# Patient Record
Sex: Male | Born: 1940 | Race: White | Hispanic: No | Smoking: Never smoker
Health system: Southern US, Community
[De-identification: ages and names within clinical notes are randomized; demographics above are authoritative.]

## PROBLEM LIST (undated history)

## (undated) DIAGNOSIS — I2699 Other pulmonary embolism without acute cor pulmonale: Secondary | ICD-10-CM

## (undated) DIAGNOSIS — R351 Nocturia: Secondary | ICD-10-CM

## (undated) DIAGNOSIS — I2510 Atherosclerotic heart disease of native coronary artery without angina pectoris: Secondary | ICD-10-CM

## (undated) DIAGNOSIS — Z9289 Personal history of other medical treatment: Secondary | ICD-10-CM

## (undated) DIAGNOSIS — N200 Calculus of kidney: Secondary | ICD-10-CM

## (undated) DIAGNOSIS — J189 Pneumonia, unspecified organism: Secondary | ICD-10-CM

## (undated) DIAGNOSIS — F32A Depression, unspecified: Secondary | ICD-10-CM

## (undated) DIAGNOSIS — Z8719 Personal history of other diseases of the digestive system: Secondary | ICD-10-CM

## (undated) DIAGNOSIS — I10 Essential (primary) hypertension: Secondary | ICD-10-CM

## (undated) DIAGNOSIS — F329 Major depressive disorder, single episode, unspecified: Secondary | ICD-10-CM

## (undated) DIAGNOSIS — Z87442 Personal history of urinary calculi: Secondary | ICD-10-CM

## (undated) DIAGNOSIS — I639 Cerebral infarction, unspecified: Secondary | ICD-10-CM

## (undated) DIAGNOSIS — E785 Hyperlipidemia, unspecified: Secondary | ICD-10-CM

## (undated) DIAGNOSIS — E119 Type 2 diabetes mellitus without complications: Secondary | ICD-10-CM

## (undated) DIAGNOSIS — R569 Unspecified convulsions: Secondary | ICD-10-CM

## (undated) DIAGNOSIS — E669 Obesity, unspecified: Secondary | ICD-10-CM

## (undated) DIAGNOSIS — R600 Localized edema: Secondary | ICD-10-CM

## (undated) DIAGNOSIS — Z973 Presence of spectacles and contact lenses: Secondary | ICD-10-CM

## (undated) DIAGNOSIS — I219 Acute myocardial infarction, unspecified: Secondary | ICD-10-CM

## (undated) DIAGNOSIS — M1990 Unspecified osteoarthritis, unspecified site: Secondary | ICD-10-CM

## (undated) HISTORY — PX: HERNIA REPAIR: SHX51

## (undated) HISTORY — DX: Type 2 diabetes mellitus without complications: E11.9

## (undated) HISTORY — DX: Calculus of kidney: N20.0

## (undated) HISTORY — DX: Cerebral infarction, unspecified: I63.9

## (undated) HISTORY — PX: ANGIOPLASTY / STENTING ILIAC: SUR31

## (undated) HISTORY — DX: Other pulmonary embolism without acute cor pulmonale: I26.99

## (undated) HISTORY — DX: Obesity, unspecified: E66.9

## (undated) HISTORY — PX: COLONOSCOPY: SHX174

## (undated) HISTORY — PX: JOINT REPLACEMENT: SHX530

## (undated) HISTORY — DX: Major depressive disorder, single episode, unspecified: F32.9

## (undated) HISTORY — DX: Localized edema: R60.0

## (undated) HISTORY — DX: Unspecified osteoarthritis, unspecified site: M19.90

## (undated) HISTORY — DX: Atherosclerotic heart disease of native coronary artery without angina pectoris: I25.10

## (undated) HISTORY — DX: Depression, unspecified: F32.A

## (undated) HISTORY — DX: Personal history of other medical treatment: Z92.89

## (undated) HISTORY — DX: Hyperlipidemia, unspecified: E78.5

## (undated) HISTORY — PX: UMBILICAL HERNIA REPAIR: SHX196

## (undated) HISTORY — PX: TOTAL KNEE ARTHROPLASTY: SHX125

## (undated) HISTORY — PX: BACK SURGERY: SHX140

## (undated) HISTORY — DX: Essential (primary) hypertension: I10

---

## 1994-06-30 HISTORY — PX: CORONARY ARTERY BYPASS GRAFT: SHX141

## 1998-06-20 ENCOUNTER — Observation Stay (HOSPITAL_COMMUNITY): Admission: RE | Admit: 1998-06-20 | Discharge: 1998-06-21 | Payer: Self-pay | Admitting: Surgery

## 1998-06-23 ENCOUNTER — Ambulatory Visit (HOSPITAL_COMMUNITY): Admission: RE | Admit: 1998-06-23 | Discharge: 1998-06-23 | Payer: Self-pay | Admitting: Surgery

## 1998-09-18 ENCOUNTER — Emergency Department (HOSPITAL_COMMUNITY): Admission: EM | Admit: 1998-09-18 | Discharge: 1998-09-19 | Payer: Self-pay | Admitting: Emergency Medicine

## 1999-04-16 ENCOUNTER — Ambulatory Visit: Admission: RE | Admit: 1999-04-16 | Discharge: 1999-04-16 | Payer: Self-pay | Admitting: Family Medicine

## 1999-05-04 ENCOUNTER — Inpatient Hospital Stay (HOSPITAL_COMMUNITY): Admission: EM | Admit: 1999-05-04 | Discharge: 1999-05-10 | Payer: Self-pay | Admitting: Emergency Medicine

## 1999-05-04 ENCOUNTER — Encounter: Payer: Self-pay | Admitting: Emergency Medicine

## 1999-05-04 ENCOUNTER — Encounter: Payer: Self-pay | Admitting: *Deleted

## 1999-05-06 ENCOUNTER — Encounter: Payer: Self-pay | Admitting: *Deleted

## 1999-05-08 ENCOUNTER — Encounter: Payer: Self-pay | Admitting: *Deleted

## 1999-05-10 ENCOUNTER — Encounter: Payer: Self-pay | Admitting: *Deleted

## 1999-05-10 ENCOUNTER — Inpatient Hospital Stay (HOSPITAL_COMMUNITY)
Admission: RE | Admit: 1999-05-10 | Discharge: 1999-05-22 | Payer: Self-pay | Admitting: Physical Medicine & Rehabilitation

## 1999-05-11 ENCOUNTER — Encounter: Payer: Self-pay | Admitting: Physical Medicine & Rehabilitation

## 1999-05-13 ENCOUNTER — Encounter: Payer: Self-pay | Admitting: Physical Medicine & Rehabilitation

## 1999-05-29 ENCOUNTER — Encounter
Admission: RE | Admit: 1999-05-29 | Discharge: 1999-08-19 | Payer: Self-pay | Admitting: Physical Medicine & Rehabilitation

## 1999-06-25 ENCOUNTER — Encounter
Admission: RE | Admit: 1999-06-25 | Discharge: 1999-09-23 | Payer: Self-pay | Admitting: Physical Medicine & Rehabilitation

## 1999-09-05 ENCOUNTER — Encounter: Payer: Self-pay | Admitting: Specialist

## 1999-09-05 ENCOUNTER — Ambulatory Visit (HOSPITAL_COMMUNITY): Admission: RE | Admit: 1999-09-05 | Discharge: 1999-09-05 | Payer: Self-pay | Admitting: Specialist

## 1999-10-10 ENCOUNTER — Encounter: Payer: Self-pay | Admitting: Specialist

## 1999-10-10 ENCOUNTER — Ambulatory Visit (HOSPITAL_COMMUNITY): Admission: RE | Admit: 1999-10-10 | Discharge: 1999-10-10 | Payer: Self-pay | Admitting: Orthopedic Surgery

## 1999-10-22 ENCOUNTER — Encounter: Payer: Self-pay | Admitting: Specialist

## 1999-10-22 ENCOUNTER — Encounter: Admission: RE | Admit: 1999-10-22 | Discharge: 1999-10-22 | Payer: Self-pay | Admitting: Specialist

## 2000-04-22 ENCOUNTER — Inpatient Hospital Stay (HOSPITAL_COMMUNITY): Admission: EM | Admit: 2000-04-22 | Discharge: 2000-04-24 | Payer: Self-pay | Admitting: Emergency Medicine

## 2000-04-23 ENCOUNTER — Encounter: Payer: Self-pay | Admitting: Cardiovascular Disease

## 2001-02-17 ENCOUNTER — Encounter: Admission: RE | Admit: 2001-02-17 | Discharge: 2001-02-27 | Payer: Self-pay | Admitting: Anesthesiology

## 2001-12-18 ENCOUNTER — Encounter: Payer: Self-pay | Admitting: Emergency Medicine

## 2001-12-18 ENCOUNTER — Inpatient Hospital Stay (HOSPITAL_COMMUNITY): Admission: EM | Admit: 2001-12-18 | Discharge: 2001-12-20 | Payer: Self-pay | Admitting: Emergency Medicine

## 2002-01-13 ENCOUNTER — Encounter: Admission: RE | Admit: 2002-01-13 | Discharge: 2002-04-13 | Payer: Self-pay | Admitting: Family Medicine

## 2002-08-19 ENCOUNTER — Encounter: Payer: Self-pay | Admitting: Emergency Medicine

## 2002-08-19 ENCOUNTER — Emergency Department (HOSPITAL_COMMUNITY): Admission: EM | Admit: 2002-08-19 | Discharge: 2002-08-19 | Payer: Self-pay | Admitting: Emergency Medicine

## 2003-01-31 ENCOUNTER — Ambulatory Visit (HOSPITAL_COMMUNITY): Admission: RE | Admit: 2003-01-31 | Discharge: 2003-01-31 | Payer: Self-pay | Admitting: Cardiology

## 2003-01-31 ENCOUNTER — Encounter: Payer: Self-pay | Admitting: Cardiology

## 2003-02-05 ENCOUNTER — Emergency Department (HOSPITAL_COMMUNITY): Admission: EM | Admit: 2003-02-05 | Discharge: 2003-02-05 | Payer: Self-pay | Admitting: *Deleted

## 2003-12-12 ENCOUNTER — Inpatient Hospital Stay (HOSPITAL_COMMUNITY): Admission: EM | Admit: 2003-12-12 | Discharge: 2003-12-14 | Payer: Self-pay | Admitting: Emergency Medicine

## 2003-12-12 ENCOUNTER — Encounter: Payer: Self-pay | Admitting: Cardiology

## 2004-04-12 ENCOUNTER — Encounter (INDEPENDENT_AMBULATORY_CARE_PROVIDER_SITE_OTHER): Payer: Self-pay | Admitting: Specialist

## 2004-04-12 ENCOUNTER — Ambulatory Visit (HOSPITAL_COMMUNITY): Admission: RE | Admit: 2004-04-12 | Discharge: 2004-04-12 | Payer: Self-pay | Admitting: Neurological Surgery

## 2004-04-25 ENCOUNTER — Observation Stay (HOSPITAL_COMMUNITY): Admission: EM | Admit: 2004-04-25 | Discharge: 2004-04-26 | Payer: Self-pay | Admitting: Emergency Medicine

## 2004-05-10 ENCOUNTER — Ambulatory Visit: Payer: Self-pay | Admitting: Cardiovascular Disease

## 2004-05-15 ENCOUNTER — Ambulatory Visit: Payer: Self-pay | Admitting: Cardiovascular Disease

## 2004-05-27 ENCOUNTER — Ambulatory Visit: Payer: Self-pay

## 2004-06-18 ENCOUNTER — Ambulatory Visit (HOSPITAL_COMMUNITY): Admission: RE | Admit: 2004-06-18 | Discharge: 2004-06-18 | Payer: Self-pay | Admitting: Neurology

## 2004-06-26 ENCOUNTER — Encounter: Admission: RE | Admit: 2004-06-26 | Discharge: 2004-09-24 | Payer: Self-pay | Admitting: Neurology

## 2004-07-05 ENCOUNTER — Ambulatory Visit: Payer: Self-pay | Admitting: Cardiovascular Disease

## 2004-07-11 ENCOUNTER — Ambulatory Visit: Payer: Self-pay

## 2005-01-31 ENCOUNTER — Ambulatory Visit: Payer: Self-pay | Admitting: Cardiovascular Disease

## 2005-02-12 ENCOUNTER — Ambulatory Visit: Payer: Self-pay

## 2005-02-27 ENCOUNTER — Ambulatory Visit: Payer: Self-pay | Admitting: Cardiovascular Disease

## 2005-02-28 ENCOUNTER — Ambulatory Visit: Payer: Self-pay | Admitting: Internal Medicine

## 2005-02-28 ENCOUNTER — Ambulatory Visit (HOSPITAL_COMMUNITY): Admission: RE | Admit: 2005-02-28 | Discharge: 2005-02-28 | Payer: Self-pay | Admitting: Internal Medicine

## 2005-03-01 ENCOUNTER — Encounter: Admission: RE | Admit: 2005-03-01 | Discharge: 2005-03-01 | Payer: Self-pay | Admitting: Neurology

## 2005-03-05 ENCOUNTER — Ambulatory Visit (HOSPITAL_COMMUNITY): Admission: RE | Admit: 2005-03-05 | Discharge: 2005-03-06 | Payer: Self-pay | Admitting: Cardiology

## 2005-03-14 ENCOUNTER — Ambulatory Visit: Payer: Self-pay | Admitting: Cardiovascular Disease

## 2005-05-16 ENCOUNTER — Ambulatory Visit: Payer: Self-pay | Admitting: Cardiovascular Disease

## 2005-07-24 ENCOUNTER — Ambulatory Visit (HOSPITAL_COMMUNITY): Admission: RE | Admit: 2005-07-24 | Discharge: 2005-07-24 | Payer: Self-pay | Admitting: Urology

## 2005-11-11 ENCOUNTER — Emergency Department (HOSPITAL_COMMUNITY): Admission: EM | Admit: 2005-11-11 | Discharge: 2005-11-11 | Payer: Self-pay | Admitting: Emergency Medicine

## 2006-06-17 ENCOUNTER — Ambulatory Visit: Payer: Self-pay | Admitting: Internal Medicine

## 2006-06-19 ENCOUNTER — Ambulatory Visit: Payer: Self-pay | Admitting: Cardiology

## 2006-06-26 ENCOUNTER — Ambulatory Visit: Payer: Self-pay

## 2006-06-29 ENCOUNTER — Ambulatory Visit: Payer: Self-pay | Admitting: Cardiovascular Disease

## 2007-03-04 ENCOUNTER — Ambulatory Visit: Payer: Self-pay | Admitting: Internal Medicine

## 2007-03-11 ENCOUNTER — Ambulatory Visit: Payer: Self-pay | Admitting: Internal Medicine

## 2007-03-11 DIAGNOSIS — K648 Other hemorrhoids: Secondary | ICD-10-CM | POA: Insufficient documentation

## 2007-04-14 ENCOUNTER — Ambulatory Visit: Payer: Self-pay | Admitting: Cardiovascular Disease

## 2007-05-03 ENCOUNTER — Ambulatory Visit: Payer: Self-pay | Admitting: Cardiovascular Disease

## 2007-05-03 ENCOUNTER — Inpatient Hospital Stay (HOSPITAL_COMMUNITY): Admission: RE | Admit: 2007-05-03 | Discharge: 2007-05-07 | Payer: Self-pay | Admitting: Orthopedic Surgery

## 2007-08-03 ENCOUNTER — Observation Stay (HOSPITAL_COMMUNITY): Admission: EM | Admit: 2007-08-03 | Discharge: 2007-08-04 | Payer: Self-pay | Admitting: Emergency Medicine

## 2007-08-03 ENCOUNTER — Ambulatory Visit: Payer: Self-pay | Admitting: Internal Medicine

## 2007-08-16 ENCOUNTER — Ambulatory Visit: Payer: Self-pay | Admitting: Cardiovascular Disease

## 2007-09-28 DIAGNOSIS — I6350 Cerebral infarction due to unspecified occlusion or stenosis of unspecified cerebral artery: Secondary | ICD-10-CM | POA: Insufficient documentation

## 2007-09-28 DIAGNOSIS — N200 Calculus of kidney: Secondary | ICD-10-CM | POA: Insufficient documentation

## 2007-09-28 DIAGNOSIS — E119 Type 2 diabetes mellitus without complications: Secondary | ICD-10-CM | POA: Insufficient documentation

## 2007-09-28 DIAGNOSIS — I2510 Atherosclerotic heart disease of native coronary artery without angina pectoris: Secondary | ICD-10-CM | POA: Insufficient documentation

## 2007-09-28 DIAGNOSIS — R609 Edema, unspecified: Secondary | ICD-10-CM

## 2007-09-28 DIAGNOSIS — F329 Major depressive disorder, single episode, unspecified: Secondary | ICD-10-CM

## 2007-09-28 DIAGNOSIS — IMO0002 Reserved for concepts with insufficient information to code with codable children: Secondary | ICD-10-CM | POA: Insufficient documentation

## 2007-09-28 DIAGNOSIS — M1710 Unilateral primary osteoarthritis, unspecified knee: Secondary | ICD-10-CM

## 2007-09-28 DIAGNOSIS — I10 Essential (primary) hypertension: Secondary | ICD-10-CM

## 2007-09-28 DIAGNOSIS — E785 Hyperlipidemia, unspecified: Secondary | ICD-10-CM

## 2007-11-23 ENCOUNTER — Encounter: Admission: RE | Admit: 2007-11-23 | Discharge: 2007-11-23 | Payer: Self-pay | Admitting: Family Medicine

## 2008-02-17 ENCOUNTER — Ambulatory Visit: Payer: Self-pay | Admitting: Cardiovascular Disease

## 2008-05-27 ENCOUNTER — Encounter: Admission: RE | Admit: 2008-05-27 | Discharge: 2008-05-27 | Payer: Self-pay | Admitting: Family Medicine

## 2008-08-16 DIAGNOSIS — Z951 Presence of aortocoronary bypass graft: Secondary | ICD-10-CM

## 2008-08-17 ENCOUNTER — Encounter: Payer: Self-pay | Admitting: Cardiovascular Disease

## 2008-08-17 ENCOUNTER — Ambulatory Visit: Payer: Self-pay | Admitting: Cardiovascular Disease

## 2008-08-17 DIAGNOSIS — S82899A Other fracture of unspecified lower leg, initial encounter for closed fracture: Secondary | ICD-10-CM | POA: Insufficient documentation

## 2008-10-16 ENCOUNTER — Ambulatory Visit: Payer: Self-pay | Admitting: Internal Medicine

## 2008-10-16 ENCOUNTER — Inpatient Hospital Stay (HOSPITAL_COMMUNITY): Admission: EM | Admit: 2008-10-16 | Discharge: 2008-10-20 | Payer: Self-pay | Admitting: Orthopedic Surgery

## 2008-10-17 ENCOUNTER — Ambulatory Visit: Payer: Self-pay | Admitting: Physical Medicine & Rehabilitation

## 2008-10-20 ENCOUNTER — Inpatient Hospital Stay (HOSPITAL_COMMUNITY)
Admission: RE | Admit: 2008-10-20 | Discharge: 2008-10-28 | Payer: Self-pay | Admitting: Physical Medicine & Rehabilitation

## 2009-01-25 ENCOUNTER — Telehealth: Payer: Self-pay | Admitting: Cardiovascular Disease

## 2009-03-14 ENCOUNTER — Ambulatory Visit: Payer: Self-pay | Admitting: Cardiovascular Disease

## 2009-03-19 ENCOUNTER — Telehealth (INDEPENDENT_AMBULATORY_CARE_PROVIDER_SITE_OTHER): Payer: Self-pay | Admitting: Radiology

## 2009-03-27 ENCOUNTER — Telehealth (INDEPENDENT_AMBULATORY_CARE_PROVIDER_SITE_OTHER): Payer: Self-pay | Admitting: *Deleted

## 2009-03-27 ENCOUNTER — Encounter: Admission: RE | Admit: 2009-03-27 | Discharge: 2009-03-27 | Payer: Self-pay | Admitting: Neurological Surgery

## 2009-03-28 ENCOUNTER — Encounter: Payer: Self-pay | Admitting: Internal Medicine

## 2009-03-28 ENCOUNTER — Ambulatory Visit: Payer: Self-pay

## 2009-04-02 ENCOUNTER — Encounter (INDEPENDENT_AMBULATORY_CARE_PROVIDER_SITE_OTHER): Payer: Self-pay | Admitting: *Deleted

## 2009-04-05 ENCOUNTER — Telehealth: Payer: Self-pay | Admitting: Cardiovascular Disease

## 2009-04-12 ENCOUNTER — Inpatient Hospital Stay (HOSPITAL_COMMUNITY): Admission: RE | Admit: 2009-04-12 | Discharge: 2009-04-15 | Payer: Self-pay | Admitting: Neurological Surgery

## 2009-05-14 ENCOUNTER — Encounter: Admission: RE | Admit: 2009-05-14 | Discharge: 2009-05-14 | Payer: Self-pay | Admitting: Neurological Surgery

## 2009-07-09 ENCOUNTER — Encounter: Admission: RE | Admit: 2009-07-09 | Discharge: 2009-07-09 | Payer: Self-pay | Admitting: Neurological Surgery

## 2009-07-25 ENCOUNTER — Encounter (INDEPENDENT_AMBULATORY_CARE_PROVIDER_SITE_OTHER): Payer: Self-pay | Admitting: *Deleted

## 2009-09-07 ENCOUNTER — Ambulatory Visit: Payer: Self-pay | Admitting: Cardiovascular Disease

## 2009-09-07 DIAGNOSIS — Z96649 Presence of unspecified artificial hip joint: Secondary | ICD-10-CM | POA: Insufficient documentation

## 2009-10-24 ENCOUNTER — Ambulatory Visit (HOSPITAL_COMMUNITY): Admission: RE | Admit: 2009-10-24 | Discharge: 2009-10-24 | Payer: Self-pay | Admitting: Orthopedic Surgery

## 2010-01-14 ENCOUNTER — Encounter: Payer: Self-pay | Admitting: Family Medicine

## 2010-02-07 ENCOUNTER — Telehealth: Payer: Self-pay | Admitting: Cardiovascular Disease

## 2010-02-08 ENCOUNTER — Telehealth (INDEPENDENT_AMBULATORY_CARE_PROVIDER_SITE_OTHER): Payer: Self-pay | Admitting: *Deleted

## 2010-02-14 ENCOUNTER — Ambulatory Visit: Admission: RE | Admit: 2010-02-14 | Discharge: 2010-03-21 | Payer: Self-pay | Admitting: Radiation Oncology

## 2010-02-19 ENCOUNTER — Inpatient Hospital Stay (HOSPITAL_COMMUNITY): Admission: RE | Admit: 2010-02-19 | Discharge: 2010-02-21 | Payer: Self-pay | Admitting: Orthopedic Surgery

## 2010-04-05 ENCOUNTER — Telehealth: Payer: Self-pay | Admitting: Cardiovascular Disease

## 2010-04-16 ENCOUNTER — Ambulatory Visit: Payer: Self-pay | Admitting: Cardiovascular Disease

## 2010-07-30 NOTE — Progress Notes (Signed)
   Percell Belt faxed to [employer]- Pre-Surgical @ [phone] [facility]  February 08, 2010 10:44 AM

## 2010-07-30 NOTE — Assessment & Plan Note (Signed)
Summary: 6 month rov/sl  Medications Added CYMBALTA 60 MG CPEP (DULOXETINE HCL) 1 tab by mouth once daily OXYCODONE HCL 15 MG TABS (OXYCODONE HCL) as needed HYDROCHLOROTHIAZIDE 25 MG TABS (HYDROCHLOROTHIAZIDE) Take one tablet by mouth daily as needed for swelling or shortness of breath      Allergies Added:   History of Present Illness:  He has distant history of CABG I believe in 95.  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  His lima is atretic with the LAD filling retrograde from the SVG to the Diagonal.  The native RCA is patent with multiple stents.  His last cath was 08/2007.  He is on chronic Plavix His myovue in 02/2009 was low risk with small inferior infarct mild periinfarct ischemia and EF 58%   [name] has had a lot of orthopedic and neuro problems with his back and legs.  He fractured his left ankle and has had a previous synovial cyst removed from his back.  He is having increasing pain below the knee left greater than right and may need repeat spinal surgery. He has not had problems with his previous surgery.  He is ambulating awkwardly with a cane and has a durgesic patch.  He needs some relief"  He is not having angina and his anatomy was stable in 08/2007.  He had his right hip replaced a year ago April and apparantly too much bone has grown back in the joint causing pain.  I will have him see Dr Despina Hick or Charlann Boxer for a second opinion.  He has mild LE edema and needs as needed diuretic.  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.64) 2)  Preoperative Examination  (ICD-V72.84) 3)  Fracture, Ankle, Right  (ICD-824.8) 4)  Hyperlipidemia-mixed  (ICD-272.4) 5)  Hypertension, Unspecified  (ICD-401.9) 6)  Cad, Artery Bypass Graft  (ICD-414.04) 7)  Depression  (ICD-311) 8)  Osteoarthritis, Knee  (ICD-715.96) 9)  Stroke  (ICD-434.91) 10)  Dyslipidemia  (ICD-272.4) 11)  Diabetes Mellitus  (ICD-250.00) 12)  Nephrolithiasis  (ICD-592.0) 13)  Morbid  Obesity  (ICD-278.01) 14)  Leg Edema, Right  (ICD-782.3) 15)  Hypertension  (ICD-401.9) 16)  Coronary Artery Disease  (ICD-414.00) 17)  Hemorrhoids, Internal  (ICD-455.0)  Current Medications (verified): 1)  Imdur 60 Mg Xr24h-Tab (Isosorbide Mononitrate) .Marland Kitchen.. 1 Tab Daily 2)  Aspirin 81 Mg Tbec (Aspirin) .... Take One Tablet By Mouth Daily 3)  Plavix 75 Mg Tabs (Clopidogrel Bisulfate) .Marland Kitchen.. 1 Daily 4)  Amlodipine Besylate 10 Mg Tabs (Amlodipine Besylate) .Marland Kitchen.. 1 Daily 5)  Carvedilol 12.5 Mg Tabs (Carvedilol) .Marland Kitchen.. 1 Tab By Mouth Once Daily 6)  Glucophage 1000 Mg Tabs (Metformin Hcl) .Marland Kitchen.. 1 Tab By Mouth Two Times A Day 7)  Altace 10 Mg Tabs (Ramipril) .Marland Kitchen.. 1 Tab By Mouth Once Daily 8)  Carvedilol 6.25 Mg Tabs (Carvedilol) .Marland Kitchen.. 1 Tab Two Times A Day 9)  Oscal 500/200 D-3 500-200 Mg-Unit Tabs (Calcium-Vitamin D) .Marland Kitchen.. 1 Tab By Mouth Two Times A Day 10)  Duragesic-50 50 Mcg/hr Pt72 (Fentanyl) .... As Directed  Every 2 Days 11)  Cymbalta 60 Mg Cpep (Duloxetine Hcl) .Marland Kitchen.. 1 Tab By Mouth Once Daily 12)  Oxycodone Hcl 15 Mg Tabs (Oxycodone Hcl) .... As Needed 13)  Hydrochlorothiazide 25 Mg Tabs (Hydrochlorothiazide) .... Take One Tablet By Mouth Daily As Needed For Swelling or Shortness of Breath  Allergies (verified): 1)  ! Pcn 2)  ! * Statins  Past History:  Past Medical  History: Last updated: 09/28/2007 Current Problems:  DEPRESSION (ICD-311) OSTEOARTHRITIS, KNEE (ICD-715.96) STROKE (ICD-434.91) DYSLIPIDEMIA (ICD-272.4) DIABETES MELLITUS (ICD-250.00) NEPHROLITHIASIS (ICD-592.0) MORBID OBESITY (ICD-278.01) LEG EDEMA, RIGHT (ICD-782.3) HYPERTENSION (ICD-401.9) CORONARY ARTERY DISEASE (ICD-414.00) HEMORRHOIDS, INTERNAL (ICD-455.0)  Past Surgical History: Last updated: 09/28/2007 Heart bypass 1996 Angioplasty/stent 2006 Right knee replacement 05-03-2007  Family History: Last updated: 08/17/2008 non-contributory  Social History: Last updated: 08/17/2008 Remarried Two older  boys Enjoys racing and working on cars Non-smoker Occasional Etoh  Review of Systems       Denies fever, malais, weight loss, blurry vision, decreased visual acuity, cough, sputum, SOB, hemoptysis, pleuritic pain, palpitaitons, heartburn, abdominal pain, melena, lower extremity edema, claudication, or rash.   Vital Signs:  Patient profile:   70 year old male Height:      69 inches Weight:      231 pounds BMI:     34.24 Pulse rate:   75 / minute Resp:     14 per minute BP sitting:   113 / 67  (left arm)  Vitals Entered By: Kem Parkinson (September 07, 2009 9:30 AM)  Physical Exam  General:  Affect appropriate Healthy:  appears stated age HEENT: normal Neck supple with no adenopathy JVP normal no bruits no thyromegaly Lungs clear with no wheezing and good diaphragmatic motion Heart:  S1/S2 no murmur,rub, gallop or click PMI normal Abdomen: benighn, BS positve, no tenderness, no AAA no bruit.  No HSM or HJR Distal pulses intact with no bruits No edema Neuro non-focal Skin warm and dry    Impression & Recommendations:  Problem # 1:  HIP REPLACEMENT, RIGHT, HX OF (ICD-V43.64) 2nd opinion as he has significant pain and unusual syndrome with bone growth in joint post replacement.  Continue oxycodone Orders: Misc. Referral (Misc. Ref)  Problem # 2:  HYPERTENSION, UNSPECIFIED (ICD-401.9) Well controlled His updated medication list for this problem includes:    Aspirin 81 Mg Tbec (Aspirin) .Marland Kitchen... Take one tablet by mouth daily    Amlodipine Besylate 10 Mg Tabs (Amlodipine besylate) .Marland Kitchen... 1 daily    Carvedilol 12.5 Mg Tabs (Carvedilol) .Marland Kitchen... 1 tab by mouth once daily    Altace 10 Mg Tabs (Ramipril) .Marland Kitchen... 1 tab by mouth once daily    Carvedilol 6.25 Mg Tabs (Carvedilol) .Marland Kitchen... 1 tab two times a day    Hydrochlorothiazide 25 Mg Tabs (Hydrochlorothiazide) .Marland Kitchen... Take one tablet by mouth daily as needed for swelling or shortness of breath  Problem # 3:  CAD, ARTERY BYPASS GRAFT  (ICD-414.04) No angina continue medical Rx His updated medication list for this problem includes:    Imdur 60 Mg Xr24h-tab (Isosorbide mononitrate) .Marland Kitchen... 1 tab daily    Aspirin 81 Mg Tbec (Aspirin) .Marland Kitchen... Take one tablet by mouth daily    Plavix 75 Mg Tabs (Clopidogrel bisulfate) .Marland Kitchen... 1 daily    Amlodipine Besylate 10 Mg Tabs (Amlodipine besylate) .Marland Kitchen... 1 daily    Carvedilol 12.5 Mg Tabs (Carvedilol) .Marland Kitchen... 1 tab by mouth once daily    Altace 10 Mg Tabs (Ramipril) .Marland Kitchen... 1 tab by mouth once daily    Carvedilol 6.25 Mg Tabs (Carvedilol) .Marland Kitchen... 1 tab two times a day  Problem # 4:  HYPERLIPIDEMIA-MIXED (ICD-272.4) Prev. intolerant to statins.  F/U labs 6 months  Problem # 5:  LEG EDEMA, RIGHT (ICD-782.3) Dependant with some venous insuf.  as needed HCTZ [facility] be [facility] into [facility] [address]  Patient Instructions: 1)  Your physician recommends that you schedule a follow-up appointment in: 6 months 2)  Your physician has recommended you make the following change in your medication: take HCTZ 25mg  once daily as needed 3)  You have been referred to dr Lequita Halt or dr Charlann Boxer Prescriptions: HYDROCHLOROTHIAZIDE 25 MG TABS (HYDROCHLOROTHIAZIDE) Take one tablet by mouth daily as needed for swelling or shortness of breath  #30 x 12   Entered by:   Deliah Goody, RN   Authorized by:   Colon Branch, MD, [facility]   Signed by:   Deliah Goody, RN on 09/07/2009   Method used:   Electronically to        [facility] [address]       [address]       [place], [place]  [address]       Ph: [phone]       Fax: [phone]   RxID:   [id number] IMDUR 60 MG XR24H-TAB (ISOSORBIDE MONONITRATE) 1 tab daily  #90 Tablet x 3   Entered by:   Kem Parkinson   Authorized by:   Colon Branch, MD, [facility]   Signed by:   Kem Parkinson on 09/07/2009   Method used:   Electronically to        [facility] (mail-order)             ,          Ph: [phone]        Fax: [phone]   RxID:   [id number]

## 2010-07-30 NOTE — Assessment & Plan Note (Signed)
Summary: f56m/dm      Allergies Added:   History of Present Illness:  He has distant history of CABG I believe in 95.  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  His lima is atretic with the LAD filling retrograde from the SVG to the Diagonal.  The native RCA is patent with multiple stents.  His last cath was 08/2007.  He is on chronic Plavix His myovue in 02/2009 was low risk with small inferior infarct mild periinfarct ischemia and EF 58%   [name] has had a lot of orthopedic and neuro problems with his back and legs.  He fractured his left ankle and has had a previous synovial cyst removed from his back.  He is having increasing pain below the knee left greater than right and may need repeat spinal surgery. He has not had problems with his previous surgery.  He is ambulating awkwardly with a cane and has a durgesic patch.  He needs some relief"  He is not having angina and his anatomy was stable in 08/2007.  He had his right hip replaced a year ago April and apparantly too much bone has grown back in the joint causing pain.   Apparantly his primary feels that there may be PVD.  Kellis's LE pain is orthopedic and arthritic  In the office today he had normal doppler pulses including triphasic PT's.  There is no evidence of claudication and he does not need further w/u of this  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.64) 2)  Preoperative Examination  (ICD-V72.84) 3)  Fracture, Ankle, Right  (ICD-824.8) 4)  Hyperlipidemia-mixed  (ICD-272.4) 5)  Hypertension, Unspecified  (ICD-401.9) 6)  Cad, Artery Bypass Graft  (ICD-414.04) 7)  Depression  (ICD-311) 8)  Osteoarthritis, Knee  (ICD-715.96) 9)  Stroke  (ICD-434.91) 10)  Dyslipidemia  (ICD-272.4) 11)  Diabetes Mellitus  (ICD-250.00) 12)  Nephrolithiasis  (ICD-592.0) 13)  Morbid Obesity  (ICD-278.01) 14)  Leg Edema, Right  (ICD-782.3) 15)  Hypertension  (ICD-401.9) 16)  Coronary Artery Disease   (ICD-414.00) 17)  Hemorrhoids, Internal  (ICD-455.0)  Current Medications (verified): 1)  Imdur 60 Mg Xr24h-Tab (Isosorbide Mononitrate) .Marland Kitchen.. 1 Tab Daily 2)  Aspirin 81 Mg Tbec (Aspirin) .... Take One Tablet By Mouth Daily 3)  Plavix 75 Mg Tabs (Clopidogrel Bisulfate) .Marland Kitchen.. 1 Daily 4)  Amlodipine Besylate 10 Mg Tabs (Amlodipine Besylate) .Marland Kitchen.. 1 Daily 5)  Carvedilol 12.5 Mg Tabs (Carvedilol) .Marland Kitchen.. 1 Tab By Mouth Once Daily 6)  Glucophage 1000 Mg Tabs (Metformin Hcl) .Marland Kitchen.. 1 Tab By Mouth Two Times A Day 7)  Altace 10 Mg Tabs (Ramipril) .Marland Kitchen.. 1 Tab By Mouth Once Daily 8)  Carvedilol 6.25 Mg Tabs (Carvedilol) .Marland Kitchen.. 1 Tab Two Times A Day 9)  Cymbalta 60 Mg Cpep (Duloxetine Hcl) .Marland Kitchen.. 1 Tab By Mouth Once Daily 10)  Oxycodone Hcl 15 Mg Tabs (Oxycodone Hcl) .... As Needed  Allergies (verified): 1)  ! Pcn 2)  ! * Statins  Past History:  Past Medical History: Last updated: 09/28/2007 Current Problems:  DEPRESSION (ICD-311) OSTEOARTHRITIS, KNEE (ICD-715.96) STROKE (ICD-434.91) DYSLIPIDEMIA (ICD-272.4) DIABETES MELLITUS (ICD-250.00) NEPHROLITHIASIS (ICD-592.0) MORBID OBESITY (ICD-278.01) LEG EDEMA, RIGHT (ICD-782.3) HYPERTENSION (ICD-401.9) CORONARY ARTERY DISEASE (ICD-414.00) HEMORRHOIDS, INTERNAL (ICD-455.0)  Past Surgical History: Last updated: 09/28/2007 Heart bypass 1996 Angioplasty/stent 2006 Right knee replacement 05-03-2007  Family History: Last updated: 08/17/2008 non-contributory  Social History: Last updated: 08/17/2008 Remarried Two older boys Enjoys racing and working on [employer]ational [employer]  Occasional Etoh  Review of Systems       Denies fever, malais, weight loss, blurry vision, decreased visual acuity, cough, sputum, SOB, hemoptysis, pleuritic pain, palpitaitons, heartburn, abdominal pain, melena, lower extremity edema, claudication, or rash.   Vital Signs:  Patient profile:   70 year old male Height:      69 inches Weight:      232 pounds BMI:      34.38 Pulse rate:   70 / minute Resp:     14 per minute BP sitting:   130 / 71  (left arm)  Vitals Entered By: Kem Parkinson (April 16, 2010 4:11 PM)  Physical Exam  General:  Affect appropriate Healthy:  appears stated age HEENT: normal Neck supple with no adenopathy JVP normal no bruits no thyromegaly Lungs clear with no wheezing and good diaphragmatic motion Heart:  S1/S2 no murmur,rub, gallop or click PMI normal Abdomen: benighn, BS positve, no tenderness, no AAA no bruit.  No HSM or HJR Distal pulses intact with no bruits Plus one RLE edema Neuro non-focal Skin warm and dry Plus 3 doppler pulses bilateral     Impression & Recommendations:  Problem # 1:  CAD, ARTERY BYPASS GRAFT (ICD-414.04) Stable no angina continue medical Rx His updated medication list for this problem includes:    Imdur 60 Mg Xr24h-tab (Isosorbide mononitrate) .Marland Kitchen... 1 tab daily    Aspirin 81 Mg Tbec (Aspirin) .Marland Kitchen... Take one tablet by mouth daily    Plavix 75 Mg Tabs (Clopidogrel bisulfate) .Marland Kitchen... 1 daily    Amlodipine Besylate 10 Mg Tabs (Amlodipine besylate) .Marland Kitchen... 1 daily    Carvedilol 12.5 Mg Tabs (Carvedilol) .Marland Kitchen... 1 tab by mouth once daily    Altace 10 Mg Tabs (Ramipril) .Marland Kitchen... 1 tab by mouth once daily    Carvedilol 6.25 Mg Tabs (Carvedilol) .Marland Kitchen... 1 tab two times a day  Orders: EKG w/ Interpretation (93000)  Problem # 2:  HIP REPLACEMENT, RIGHT, HX OF (ICD-V43.64) Stable improved after surgery for bone growth.  F/U Dr Charlann Boxer  Problem # 3:  HYPERTENSION, UNSPECIFIED (ICD-401.9) Well controlled The following medications were removed from the medication list:    Hydrochlorothiazide 25 Mg Tabs (Hydrochlorothiazide) .Marland Kitchen... Take one tablet by mouth daily as needed for swelling or shortness of breath His updated medication list for this problem includes:    Aspirin 81 Mg Tbec (Aspirin) .Marland Kitchen... Take one tablet by mouth daily    Amlodipine Besylate 10 Mg Tabs (Amlodipine besylate) .Marland Kitchen... 1 daily     Carvedilol 12.5 Mg Tabs (Carvedilol) .Marland Kitchen... 1 tab by mouth once daily    Altace 10 Mg Tabs (Ramipril) .Marland Kitchen... 1 tab by mouth once daily    Carvedilol 6.25 Mg Tabs (Carvedilol) .Marland Kitchen... 1 tab two times a day  Problem # 4:  HYPERLIPIDEMIA-MIXED (ICD-272.4) Well controlled labs per primary  Patient Instructions: 1)  Your physician recommends that you schedule a follow-up appointment in: 6 months with dr Eden Emms 2)  Your physician recommends that you continue on your current medications as directed. Please refer to the Current Medication list given to you today.

## 2010-07-30 NOTE — Progress Notes (Signed)
Summary: refill request   Phone Note From Pharmacy   Caller: edie [facility] [address] [phone] Summary of Call: pt needs 1 refill to last unitl 10-18 appt for issorb mono 60mg -pt in out Initial call taken by: Glynda Jaeger,  April 05, 2010 3:32 PM  Follow-up for Phone Call        spoke with pharmacist gave pt refills Follow-up by: Kem Parkinson,  April 05, 2010 3:39 PM

## 2010-07-30 NOTE — Letter (Signed)
Summary: [facility] - Surgical Clearance  [facility] - Surgical Clearance   Imported By: Marylou Mccoy 01/30/2010 17:02:46  _____________________________________________________________________  External Attachment:    Type:   Image     Comment:   External Document

## 2010-07-30 NOTE — Letter (Signed)
Summary: Appointment - Reminder 2  [facility], Main Office  [address]   [place], [place] [address]   Phone: [phone]  Fax: [phone]     July 25, 2009 MRN: [id number]   [facility] [address] [place], [place]  [address]   Dear Carlos Herman,  Our records indicate that it is time to schedule a follow-up appointment wuth Dr. Eden Emms. It is very important that we reach you to schedule this appointment. We look forward to participating in your health care needs. Please contact us at the number listed above at your earliest convenience to schedule your appointment.  If you are unable to make an appointment at this time, give [place] a call so we can update our records.  Sincerely,   Migdalia Dk [facility] Scheduling Team

## 2010-07-30 NOTE — Progress Notes (Signed)
Summary: surgery on 8/23 by dr. Charlann Boxer   Phone Note Call from Patient Call back at [facility] Phone [phone]   Caller: Patient Reason for Call: Talk to Nurse Summary of Call: per pt calling, aware debra mathis is off today. surgery on 8/23 by dr. Charlann Boxer. Initial call taken by: Lorne Skeens,  February 07, 2010 12:39 PM  Follow-up for Phone Call         PT AWARE PREOP CLEARANCE FAXED TO DR Nilsa Nutting  OFFICE  END OF [facility] PT FOR PROCEDURE. Follow-up by: Scherrie Bateman, LPN,  February 07, 2010 3:00 PM

## 2010-09-05 ENCOUNTER — Encounter: Payer: Self-pay | Admitting: Cardiovascular Disease

## 2010-09-10 NOTE — Miscellaneous (Signed)
Summary: Carvedilol 12.5 mg and 6.25 mg refilled  Clinical Lists Changes   Medications: Changed medication from CARVEDILOL 12.5 MG TABS (CARVEDILOL) 1 tab by mouth once daily to CARVEDILOL 12.5 MG TABS (CARVEDILOL) 1 tab by mouth two times a day - Signed Rx of CARVEDILOL 12.5 MG TABS (CARVEDILOL) 1 tab by mouth two times a day;  #180 x 3;  Signed;  Entered by: Celestia Khat, CMA;  Authorized by: Colon Branch, MD, [facility];  Method used: Electronically to [facility] [address] [place], [place], [place]  [address], Ph: [phone], Fax: [phone] Rx of CARVEDILOL 6.25 MG TABS (CARVEDILOL) 1 tab two times a day;  #180 Tablet x 3;  Signed;  Entered by: Celestia Khat, CMA;  Authorized by: Colon Branch, MD, [facility];  Method used: Electronically to [facility] [address] [address] [place], [place], [place]  [address], Ph: [phone], Fax: [phone] per Deliah Goody. [place], [place]  September 05, 2010 2:34 PM    Prescriptions: CARVEDILOL 6.25 MG TABS (CARVEDILOL) 1 tab two times a day  #180 Tablet x 3   Entered by:   Celestia Khat, CMA   Authorized by:   Colon Branch, MD, [facility]   Signed by:   Celestia Khat, CMA on 09/05/2010   Method used:   Electronically to        [facility]       [address]       [place], [place]  [address]       Ph: [phone]       Fax: [phone]   RxID:   [id number] CARVEDILOL 12.5 MG TABS (CARVEDILOL) 1 tab by mouth two times a day  #180 x 3   Entered by:   Celestia Khat, CMA   Authorized by:   Colon Branch, MD, [facility]   Signed by:   Celestia Khat, CMA on 09/05/2010   Method used:   Electronically to        [facility] [address] [address]       [address]       [place], [place]  [address]       Ph: [phone]       Fax: [phone]   RxID:   [id number]

## 2010-09-13 LAB — DIFFERENTIAL
Basophils Relative: 0 % (ref 0–1)
Eosinophils Absolute: 0.2 10*3/uL (ref 0.0–0.7)
Lymphs Abs: 1.8 10*3/uL (ref 0.7–4.0)
Neutro Abs: 5 10*3/uL (ref 1.7–7.7)

## 2010-09-13 LAB — BASIC METABOLIC PANEL
BUN: 10 mg/dL (ref 6–23)
BUN: 12 mg/dL (ref 6–23)
CO2: 26 mEq/L (ref 19–32)
CO2: 26 mEq/L (ref 19–32)
Calcium: 7.7 mg/dL — ABNORMAL LOW (ref 8.4–10.5)
Creatinine, Ser: 0.83 mg/dL (ref 0.4–1.5)
GFR calc Af Amer: >60 mL/min (ref >60)
Glucose, Bld: 125 mg/dL — ABNORMAL HIGH (ref 70–99)
Glucose, Bld: 135 mg/dL — ABNORMAL HIGH (ref 70–99)
Potassium: 4.2 mEq/L (ref 3.5–5.1)
Potassium: 4.4 mEq/L (ref 3.5–5.1)
Sodium: 136 mEq/L (ref 135–145)

## 2010-09-13 LAB — CBC
HCT: 25.7 % — ABNORMAL LOW (ref 39.0–52.0)
Hemoglobin: 12.6 g/dL — ABNORMAL LOW (ref 13.0–17.0)
Hemoglobin: 8.8 g/dL — ABNORMAL LOW (ref 13.0–17.0)
MCH: 31.7 pg (ref 26.0–34.0)
MCH: 31.7 pg (ref 26.0–34.0)
MCH: 32 pg (ref 26.0–34.0)
MCHC: 34.1 g/dL (ref 30.0–36.0)
MCV: 93.3 fL (ref 78.0–100.0)
Platelets: 135 10*3/uL — ABNORMAL LOW (ref 150–400)
Platelets: 241 10*3/uL (ref 150–400)
RBC: 2.61 MIL/uL — ABNORMAL LOW (ref 4.22–5.81)
RBC: 2.74 MIL/uL — ABNORMAL LOW (ref 4.22–5.81)
RBC: 3.97 MIL/uL — ABNORMAL LOW (ref 4.22–5.81)
RDW: 13.9 % (ref 11.5–15.5)
WBC: 7.6 10*3/uL (ref 4.0–10.5)

## 2010-09-13 LAB — URINALYSIS, ROUTINE W REFLEX MICROSCOPIC
Hgb urine dipstick: NEGATIVE
Nitrite: NEGATIVE
Specific Gravity, Urine: 1.019 (ref 1.005–1.030)
Urobilinogen, UA: 0.2 mg/dL (ref 0.0–1.0)

## 2010-09-13 LAB — GLUCOSE, CAPILLARY
Glucose-Capillary: 126 mg/dL — ABNORMAL HIGH (ref 70–99)
Glucose-Capillary: 126 mg/dL — ABNORMAL HIGH (ref 70–99)
Glucose-Capillary: 131 mg/dL — ABNORMAL HIGH (ref 70–99)
Glucose-Capillary: 140 mg/dL — ABNORMAL HIGH (ref 70–99)
Glucose-Capillary: 166 mg/dL — ABNORMAL HIGH (ref 70–99)

## 2010-09-13 LAB — SURGICAL PCR SCREEN
MRSA, PCR: NEGATIVE
Staphylococcus aureus: NEGATIVE

## 2010-09-13 LAB — POCT I-STAT 4, (NA,K, GLUC, HGB,HCT)
Glucose, Bld: 136 mg/dL — ABNORMAL HIGH (ref 70–99)
HCT: 30 % — ABNORMAL LOW (ref 39.0–52.0)
Hemoglobin: 10.2 g/dL — ABNORMAL LOW (ref 13.0–17.0)
Potassium: 5 mEq/L (ref 3.5–5.1)

## 2010-09-13 LAB — TYPE AND SCREEN: ABO/RH(D): A NEG

## 2010-09-13 LAB — PROTIME-INR: INR: 1.01 (ref 0.00–1.49)

## 2010-10-03 LAB — APTT: aPTT: 24 seconds (ref 24–37)

## 2010-10-03 LAB — DIFFERENTIAL
Basophils Absolute: 0 10*3/uL (ref 0.0–0.1)
Eosinophils Absolute: 0.3 10*3/uL (ref 0.0–0.7)
Eosinophils Relative: 3 % (ref 0–5)
Lymphocytes Relative: 15 % (ref 12–46)
Monocytes Absolute: 1 10*3/uL (ref 0.1–1.0)

## 2010-10-03 LAB — GLUCOSE, CAPILLARY
Glucose-Capillary: 133 mg/dL — ABNORMAL HIGH (ref 70–99)
Glucose-Capillary: 162 mg/dL — ABNORMAL HIGH (ref 70–99)

## 2010-10-03 LAB — CBC
HCT: 37.6 % — ABNORMAL LOW (ref 39.0–52.0)
Hemoglobin: 12.7 g/dL — ABNORMAL LOW (ref 13.0–17.0)
MCHC: 33.7 g/dL (ref 30.0–36.0)
MCV: 94.3 fL (ref 78.0–100.0)
Platelets: 267 10*3/uL (ref 150–400)
RDW: 13.4 % (ref 11.5–15.5)

## 2010-10-03 LAB — BASIC METABOLIC PANEL
BUN: 14 mg/dL (ref 6–23)
CO2: 29 mEq/L (ref 19–32)
Chloride: 102 mEq/L (ref 96–112)
Glucose, Bld: 132 mg/dL — ABNORMAL HIGH (ref 70–99)
Potassium: 4.8 mEq/L (ref 3.5–5.1)
Sodium: 139 mEq/L (ref 135–145)

## 2010-10-03 LAB — PROTIME-INR: Prothrombin Time: 12.4 seconds (ref 11.6–15.2)

## 2010-10-08 LAB — PROTIME-INR
INR: 2.5 — ABNORMAL HIGH (ref 0.00–1.49)
Prothrombin Time: 28.9 seconds — ABNORMAL HIGH (ref 11.6–15.2)

## 2010-10-09 LAB — BASIC METABOLIC PANEL
BUN: 4 mg/dL — ABNORMAL LOW (ref 6–23)
CO2: 28 mEq/L (ref 19–32)
Calcium: 8.3 mg/dL — ABNORMAL LOW (ref 8.4–10.5)
Chloride: 102 mEq/L (ref 96–112)
Creatinine, Ser: 0.83 mg/dL (ref 0.4–1.5)
GFR calc Af Amer: >60 mL/min (ref >60)
GFR calc Af Amer: >60 mL/min (ref >60)
GFR calc non Af Amer: >60 mL/min (ref >60)
GFR calc non Af Amer: >60 mL/min (ref >60)
Glucose, Bld: 141 mg/dL — ABNORMAL HIGH (ref 70–99)
Glucose, Bld: 177 mg/dL — ABNORMAL HIGH (ref 70–99)
Potassium: 4.2 mEq/L (ref 3.5–5.1)
Sodium: 136 mEq/L (ref 135–145)
Sodium: 136 mEq/L (ref 135–145)
Sodium: 139 mEq/L (ref 135–145)

## 2010-10-09 LAB — GLUCOSE, CAPILLARY
Glucose-Capillary: 100 mg/dL — ABNORMAL HIGH (ref 70–99)
Glucose-Capillary: 113 mg/dL — ABNORMAL HIGH (ref 70–99)
Glucose-Capillary: 119 mg/dL — ABNORMAL HIGH (ref 70–99)
Glucose-Capillary: 126 mg/dL — ABNORMAL HIGH (ref 70–99)
Glucose-Capillary: 138 mg/dL — ABNORMAL HIGH (ref 70–99)
Glucose-Capillary: 141 mg/dL — ABNORMAL HIGH (ref 70–99)
Glucose-Capillary: 146 mg/dL — ABNORMAL HIGH (ref 70–99)
Glucose-Capillary: 154 mg/dL — ABNORMAL HIGH (ref 70–99)
Glucose-Capillary: 155 mg/dL — ABNORMAL HIGH (ref 70–99)
Glucose-Capillary: 157 mg/dL — ABNORMAL HIGH (ref 70–99)
Glucose-Capillary: 157 mg/dL — ABNORMAL HIGH (ref 70–99)
Glucose-Capillary: 160 mg/dL — ABNORMAL HIGH (ref 70–99)
Glucose-Capillary: 170 mg/dL — ABNORMAL HIGH (ref 70–99)
Glucose-Capillary: 171 mg/dL — ABNORMAL HIGH (ref 70–99)
Glucose-Capillary: 173 mg/dL — ABNORMAL HIGH (ref 70–99)
Glucose-Capillary: 176 mg/dL — ABNORMAL HIGH (ref 70–99)
Glucose-Capillary: 178 mg/dL — ABNORMAL HIGH (ref 70–99)
Glucose-Capillary: 189 mg/dL — ABNORMAL HIGH (ref 70–99)
Glucose-Capillary: 190 mg/dL — ABNORMAL HIGH (ref 70–99)
Glucose-Capillary: 192 mg/dL — ABNORMAL HIGH (ref 70–99)
Glucose-Capillary: 194 mg/dL — ABNORMAL HIGH (ref 70–99)
Glucose-Capillary: 200 mg/dL — ABNORMAL HIGH (ref 70–99)
Glucose-Capillary: 203 mg/dL — ABNORMAL HIGH (ref 70–99)
Glucose-Capillary: 207 mg/dL — ABNORMAL HIGH (ref 70–99)
Glucose-Capillary: 211 mg/dL — ABNORMAL HIGH (ref 70–99)
Glucose-Capillary: 215 mg/dL — ABNORMAL HIGH (ref 70–99)
Glucose-Capillary: 229 mg/dL — ABNORMAL HIGH (ref 70–99)
Glucose-Capillary: 249 mg/dL — ABNORMAL HIGH (ref 70–99)
Glucose-Capillary: 265 mg/dL — ABNORMAL HIGH (ref 70–99)
Glucose-Capillary: 290 mg/dL — ABNORMAL HIGH (ref 70–99)

## 2010-10-09 LAB — DIFFERENTIAL
Basophils Absolute: 0.1 10*3/uL (ref 0.0–0.1)
Basophils Absolute: 0.1 10*3/uL (ref 0.0–0.1)
Basophils Relative: 1 % (ref 0–1)
Basophils Relative: 1 % (ref 0–1)
Eosinophils Relative: 4 % (ref 0–5)
Monocytes Absolute: 0.8 10*3/uL (ref 0.1–1.0)
Monocytes Absolute: 0.9 10*3/uL (ref 0.1–1.0)
Neutro Abs: 9.2 10*3/uL — ABNORMAL HIGH (ref 1.7–7.7)

## 2010-10-09 LAB — PROTIME-INR
INR: 1 (ref 0.00–1.49)
INR: 1.1 (ref 0.00–1.49)
INR: 1.2 (ref 0.00–1.49)
INR: 1.9 — ABNORMAL HIGH (ref 0.00–1.49)
INR: 2.2 — ABNORMAL HIGH (ref 0.00–1.49)
INR: 2.3 — ABNORMAL HIGH (ref 0.00–1.49)
INR: 2.4 — ABNORMAL HIGH (ref 0.00–1.49)
INR: 2.7 — ABNORMAL HIGH (ref 0.00–1.49)
INR: 3.1 — ABNORMAL HIGH (ref 0.00–1.49)
Prothrombin Time: 14.2 seconds (ref 11.6–15.2)
Prothrombin Time: 26.2 seconds — ABNORMAL HIGH (ref 11.6–15.2)
Prothrombin Time: 27.2 seconds — ABNORMAL HIGH (ref 11.6–15.2)
Prothrombin Time: 27.8 seconds — ABNORMAL HIGH (ref 11.6–15.2)
Prothrombin Time: 27.9 seconds — ABNORMAL HIGH (ref 11.6–15.2)
Prothrombin Time: 30.9 seconds — ABNORMAL HIGH (ref 11.6–15.2)
Prothrombin Time: 31.8 seconds — ABNORMAL HIGH (ref 11.6–15.2)

## 2010-10-09 LAB — CBC
HCT: 30.8 % — ABNORMAL LOW (ref 39.0–52.0)
HCT: 32.2 % — ABNORMAL LOW (ref 39.0–52.0)
HCT: 40.1 % (ref 39.0–52.0)
Hemoglobin: 10.6 g/dL — ABNORMAL LOW (ref 13.0–17.0)
Hemoglobin: 11 g/dL — ABNORMAL LOW (ref 13.0–17.0)
Hemoglobin: 13.4 g/dL (ref 13.0–17.0)
MCHC: 34.2 g/dL (ref 30.0–36.0)
MCV: 94.2 fL (ref 78.0–100.0)
MCV: 95.2 fL (ref 78.0–100.0)
Platelets: 210 10*3/uL (ref 150–400)
Platelets: 341 10*3/uL (ref 150–400)
RBC: 3.25 MIL/uL — ABNORMAL LOW (ref 4.22–5.81)
RBC: 3.37 MIL/uL — ABNORMAL LOW (ref 4.22–5.81)
RBC: 3.42 MIL/uL — ABNORMAL LOW (ref 4.22–5.81)
RBC: 4.25 MIL/uL (ref 4.22–5.81)
RDW: 13 % (ref 11.5–15.5)
RDW: 13.2 % (ref 11.5–15.5)
WBC: 11.6 10*3/uL — ABNORMAL HIGH (ref 4.0–10.5)
WBC: 9.7 10*3/uL (ref 4.0–10.5)

## 2010-10-09 LAB — COMPREHENSIVE METABOLIC PANEL
AST: 26 U/L (ref 0–37)
Albumin: 2.5 g/dL — ABNORMAL LOW (ref 3.5–5.2)
Albumin: 3.2 g/dL — ABNORMAL LOW (ref 3.5–5.2)
Alkaline Phosphatase: 103 U/L (ref 39–117)
Alkaline Phosphatase: 134 U/L — ABNORMAL HIGH (ref 39–117)
BUN: 10 mg/dL (ref 6–23)
BUN: 6 mg/dL (ref 6–23)
CO2: 29 mEq/L (ref 19–32)
Chloride: 101 mEq/L (ref 96–112)
Chloride: 101 mEq/L (ref 96–112)
Creatinine, Ser: 0.87 mg/dL (ref 0.4–1.5)
GFR calc Af Amer: >60 mL/min (ref >60)
GFR calc non Af Amer: >60 mL/min (ref >60)
Glucose, Bld: 235 mg/dL — ABNORMAL HIGH (ref 70–99)
Potassium: 4.2 mEq/L (ref 3.5–5.1)
Potassium: 4.3 mEq/L (ref 3.5–5.1)
Total Bilirubin: 0.5 mg/dL (ref 0.3–1.2)
Total Bilirubin: 0.5 mg/dL (ref 0.3–1.2)

## 2010-10-09 LAB — URINALYSIS, ROUTINE W REFLEX MICROSCOPIC
Glucose, UA: NEGATIVE mg/dL
pH: 5.5 (ref 5.0–8.0)

## 2010-10-25 ENCOUNTER — Other Ambulatory Visit: Payer: Self-pay | Admitting: Cardiovascular Disease

## 2010-10-31 ENCOUNTER — Telehealth: Payer: Self-pay | Admitting: Cardiovascular Disease

## 2010-10-31 NOTE — Telephone Encounter (Signed)
[facility] pharmacist  has question on pt carvedilol. [facility] # [id number] ref# [id number]. [facility] needs an answer before may 7th before 2pm.

## 2010-10-31 NOTE — Telephone Encounter (Signed)
Spoke with [employer], questions answered Deliah Goody

## 2010-10-31 NOTE — Telephone Encounter (Signed)
Stanton Kidney, can you please look at the patients office notes and med list and clarify the Carvedilol.  It looks like they have been getting both strengths and this is two co pays.  [facility] called and needed to know how to fill this medication and if pt is taking 12.5 bid, one Rx would be cheaper.  Thanks   [employer]

## 2010-11-12 NOTE — Op Note (Signed)
[name], [name]             ACCOUNT NO.:  [id number]   MEDICAL RECORD NO.:  [id number]          PATIENT TYPE:  INP   LOCATION:  2024                         FACILITY:  [facility]   PHYSICIAN:  Eulas Post, MD    DATE OF BIRTH:  [birth year]   DATE OF PROCEDURE:  10/16/2008  DATE OF DISCHARGE:                               OPERATIVE REPORT   SURGEON:  Eulas Post, MD   FIRST ASSISTANTS:  None.   PREOPERATIVE DIAGNOSIS:  Right intertrochanteric hip fracture.   POSTOPERATIVE DIAGNOSIS:  Right intertrochanteric hip fracture.   PROCEDURE:  Right hip trochanteric femoral nailing.   OPERATIVE IMPLANTS:  Synthes short trochanteric femoral nail with a size  105-mm helical blade and a size 11 nail with a single size 38-mm  interlocking screw.   PREOPERATIVE INDICATIONS:  [name] is a 70 year old  gentleman who has significant coronary artery disease and has had bypass  grafting who is chronically on Plavix and aspirin, and also had a right  total knee arthroplasty done approximately 2 years ago, who had a fall  and broke his right hip.  He had an intertrochanteric hip fracture.  He  elected to undergo the above-named procedure.  The risks, benefits, and  alternatives were discussed with him and the family preoperatively  including but not limited to risks of infection, bleeding, nerve injury,  periprosthetic fracture, malunion, nonunion, limp, cardiopulmonary  complications, blood clots, among others, and he was willing to proceed.  The decision to use a short trochanteric femoral nail was made based on  the fact that it was a stable intertrochanteric hip fracture pattern,  and I did not want to place an implant that would approach the total  knee arthroplasty below.  Additionally, given the fact that he was on  Plavix and aspirin, the trochanteric femoral nail carries a smaller  incision and smaller blood loss than compression hip screw.   OPERATIVE PROCEDURE:   The patient was brought to the operating room,  placed in supine position.  A 1 g of  vancomycin was given.  The general  anesthesia was administered.  The right lower extremity was placed in  gentle traction and the fracture reduced.  Confirmation of reduction was  made on AP and lateral views.  We then prepped and draped the right  lower extremity in the usual sterile fashion.  Incision was made over  the proximal greater trochanter and a guidewire was introduced.  Confirmation of the position was made on AP and lateral views.  We then  opened the proximal femur and introduced our nail.  I did not need to  ream in order to accommodate a size 11 nail.  The nail went down without  needing to mallet.  Once the nail was in appropriate position, we placed  our guide pin for the helical blade into the femoral head.  Care was  taken to center it on both the AP and lateral views.  Appropriate tip-  apex distance was achieved.  We then measured and placed the helical  blade.  We then secured the proximal lock  over the helical blade  allowing appropriate slack in order to allow sliding of the helical  blade if needed.   We then turned our attention to the distal interlocking screw and the  screw was placed after measuring the appropriate size.  Confirmation on  AP and lateral views were made for all implants.  The fracture was  reduced anatomically.  We irrigated the wounds copiously and removed the  jig and closed with Vicryl followed by Monocryl for the skin.  Steri-  Strips and sterile gauze were applied.  The patient was awakened and  returned to the PACU in  stable and satisfactory condition.  There were no complications.  The  patient tolerated the procedure well.  He will be weightbearing as  tolerated and placed on Coumadin for a period of 1 month  postoperatively.  He will be admitted to telemetry for cardiac  observation.      Eulas Post, MD  Electronically Signed      JPL/MEDQ  D:  10/16/2008  T:  10/17/2008  Job:  [id number]

## 2010-11-12 NOTE — Assessment & Plan Note (Signed)
[facility]                            CARDIOLOGY OFFICE NOTE   CINCERE, ZORN                      MRN:          [id number]  DATE:04/14/2007                            DOB:          07-03-1940    Carlos Herman returns today for followup.  He is a 70 year old patient I have  followed for coronary artery disease.  The patient has had previous  coronary bypass surgery in 1996.  He had an occluded graft to the right  coronary artery and atretic to the LAD diagonal filler retrograde from a  vein graft.  His native right coronary artery has 3 stents in it.  He  has 2 older stents in the mid vessel.  His last intervention was done in  September 2006 by Dr. Juanda Chance, who placed a bare-metal stent to the  proximal right coronary artery.   Carlos Herman has been doing well.  He has not had any significant chest pain.  His last Myoview was June 26, 2006, which showed no evidence of scar  or ischemia, and an EF of 59%.   Carlos Herman has been markedly hampered by a previous TIA, which has given him a  bit of a footdrop on the right side.  He had extensive rehab for this.  Now he has debilitating right knee pain, and needs a right knee  replacement by Dr. Sherlean Foot.   I think Carlos Herman is stable to have this done.  He is not having angina.  His  Myoview was nonischemic.  His most recent intervention was with a bare-  metal stent.  He has already stopped his Plavix in anticipation of  surgery, which may happen next Monday.   I did tell Carlos Herman that I will change his medications around.  He is on  Toprol as a beta blocker.  He is a diabetic.  I will switch him over to  12.5 mg of Coreg b.i.d. for better rate control.  I explained to him  that in regards to protecting his heart, and avoiding a preop MI, that  beta blockers were the best medicine available, and I would like to  increase the intensity of this therapy a bit prior to his surgery.  I  also gave him a note today to notify us so we can  follow him  perioperatively, given his moderate risk.   REVIEW OF SYSTEMS:  Otherwise, negative.  He has not had any recurrent  TIA's or CVA's.  He has mild exertional dyspnea, which is stable.  There  has been no lower extremity edema.  No palpitations.  No syncope, and  clearly no angina.   MEDICATIONS:  Include:  1. Toprol to be stopped, to be switched to Coreg 12.5 b.i.d.  2. Norvasc 5 a day.  3. Isosorbide 60 a day.  4. Altace 10 a day.  5. Zetia 10 a day.  6. Baby aspirin a day.  7. Plavix 75 a day, held.  8. Cymbalta 60 a day.  9. Metformin 1 gm b.i.d.  10.Januvia 60 a day.   EXAMINATION:  Remarkable for an overweight white male in no  distress.  Weight is 239.  Blood pressure is 124/70.  Pulse 74 and regular.  Respiratory rate is 14.  Afebrile.  HEENT:  Unremarkable.  NECK:  Supple.  There is no lymphadenopathy.  No thyromegaly.  No JVP  elevation.  LUNGS:  Clear with good diaphragmatic motion.  No wheezing.  There is an S1 and S2 with distant heart sounds.  PMI is not palpable.  Bowel sounds are positive.  No tenderness.  No hepatosplenomegaly or  hepatojugular reflux.  No AAA.  Distal pulses are intact with trace edema.  NEUROLOGIC:  Nonfocal.  He has a saphenous vein harvest site in the  inside of his left leg with significant swelling and crepitus of the  right knee.  He does have decreased abduction at the foot and ankle  joint from his previous stroke.  His neurological exam is otherwise  nonfocal.   His EKG is essentially normal with a heart rate of 74 and a bit of sinus  arrhythmia.   IMPRESSION:  1. Coronary artery disease.  Previous coronary artery bypass graft      with stenting of the native right coronary artery.  Continue      aspirin and beta blocker.  Plavix held.  2. Preop clearance.  Moderate risk in regards to previous coronary      disease, but stable with nonischemic Myoview.  Toprol switched to      Coreg.  To be notified to watch patient  perioperatively.  Cleared      for knee surgery with Dr. Sherlean Foot.  3. Hypertension.  Currently well controlled.  Continue medications      including Norvasc, Altace, and Toprol.  Continue low-salt diet.  4. Diabetes.  Followup quarterly hemoglobin A1c with primary care M.D.      Diabetic diet while in the hospital.  5. Previous transient ischemic attack, or stroke.  Aspirin and Plavix      being held for surgery.  As I recall, he did not have significant      carotid disease in the past.  There are no carotid bruits.      Currently stable.  6. Osteoarthritis of the right knee.  Proceed with right knee surgery      with Dr. Sherlean Foot.     Noralyn Pick. Eden Emms, MD, [facility]  Electronically Signed    PCN/MedQ  DD: 04/14/2007  DT: 04/15/2007  Job #: [id number]   cc:   Mila Homer. Sherlean Foot, M.D.

## 2010-11-12 NOTE — Discharge Summary (Signed)
NAME:  Carlos Herman, Carlos Herman             ACCOUNT NO.:  [id number]   MEDICAL RECORD NO.:  [id number]          PATIENT TYPE:  INP   LOCATION:  3304                         FACILITY:  [facility]   PHYSICIAN:  Noralyn Pick. Eden Emms, MD, FACCDATE OF BIRTH:  06/12/1941   DATE OF ADMISSION:  05/03/2007  DATE OF DISCHARGE:                               DISCHARGE SUMMARY   Bud is a 70 year old patient I have followed for quite sometime.  He  has had previous coronary artery bypass surgery in 1996 with LIMA to  LAD, vein graft to diagonal, vein graft to OM1 and 3, and vein graft to  the PDA.  The vein graft to the PDA is known to be occluded.  He has  multiple stents in his native RCA and most recent of which was a bare-  metal stent placed in, I believe, 2006.  He has preserved LV function.   Coronary risk factors include hypertension, diabetes and hyperlipidemia.   He has also had vascular disease with previous CVAs in 2000 and 2005.  He was on chronic aspirin and Plavix.   He is status post right knee surgery.  He is currently stable.  Hemodynamics are fine.  Intra-Op notes indicated no unusual blood loss,  chest pain or EKG changes; there were no arrhythmias.  Bud is currently  doing well.  He has already eaten.  He has his knee in a motion machine.  He does not have any significant angina, shortness of breath, PND or  orthopnea.   REVIEW OF SYSTEMS:  Otherwise negative.   PAST MEDICAL HISTORY:  Remarkable for:  1. Extensive coronary artery disease with multiple stents and previous      CABG in 1996.  2. Non-insulin-dependent diabetes.  3. Hyperlipidemia.  4. Hypertension.  5. Previous CVA in 2000 and 2005; at one point, I believe, he had a      left footdrop.  6. His has a chronic pain syndrome in his legs.  7. He has DJD of the right knee which has just been operated on.  8. He has also had spinal cyst surgery and umbilical hernia repair,.   HE IS ALLERGIC TO PENICILLIN AND INTOLERANT TO  STATINS.   He is divorced.  He was recently remarried last year.  He enjoys racing  cars with Dr. Darnell Level who is a good friend.  He does not drink.  He  is a previous cigar smoker.   FAMILY HISTORY:  Remarkable for both parents dying in their 24's, one  from emphysema and one from CVAs.   Prior to his operation, his medications included:  1. Coreg 12.5 b.i.d.  2. Norvasc 5 a day.  3. Isordil 60 a day.  4. Altace 10 a day.  5. Zetia 10 a day.  6. Baby aspirin.  7. Plavix 75 a day.  8. Cymbalta 60 a day.  9. Metformin 1 gm b.i.d.  10.Januvia 60 a day.   PHYSICAL EXAMINATION:  GENERAL:  Remarkable for an elderly white male in  no distress.  He looks well.  VITAL SIGNS:  Blood pressure is 120/70, pulse is  80 and regular,  afebrile, respiratory rate 14.  HEENT:  Unremarkable.  Carotids normal without bruit.  No  lymphadenopathy.  No thyromegaly.  No JVP elevation.  LUNGS:  Clear anteriorly with no wheezing.  Good diaphragmatic motion.  CARDIOVASCULAR:  S1, S2.  Distant heart sounds.  PMI not palpable.  ABDOMEN:  Soft.  No AAA.  No bruit.  Bowel sounds positive.  No  hepatosplenomegaly or hepatojugular reflux.  EXTREMITIES:  Right leg shows no edema.  Slightly decreased plantar  flexion and muscular strength.  Right leg is in the knee motion machine  status post right knee replacement.  NEURO:  Currently, nonfocal.  SKIN:  Warm and dry.   His baseline electrocardiogram shows sinus rhythm with poor R wave  progression, no acute changes.   Current lab work is remarkable for a potassium of 4.4, creatinine of  0.9.  CBC shows a white blood cell count of 8.5, hematocrit of 37.4.   Chest x-ray from October 28 showed stable cardiomegaly post sternotomy  with no acute changes.   IMPRESSION:  1. Coronary disease, Plavix held prior to knee surgery, currently      stable.  Continue beta-blocker.  Resume anticoagulants,      particularly Plavix as soon as possible, particularly  given the      number of stents he has in his native right coronary artery with a      known failed saphenous vein graft to the PDA.  2. Hypertension, currently well controlled.  Continue to follow      postoperatively.  Currently, not anemic with no profound blood      loss.  3. Hypercholesterolemia.  Continue Zetia.  INTOLERANT TO STAIN DRUGS.  4. History of cerebrovascular accident/transient ischemic attack.  I      will have to review my records.  I do not think he has any critical      carotid disease.  He has been on Plavix and previously Aggrenox for      this as well.  5. Status post knee surgery.   PLAN:  Per orthopedics, continue early motion of the knee joint  mechanically.  Lovenox prophylaxis per ortho.   Currently, patient's cardiac status is stable.  We would be happy to  follow him in the peri-op period.      Noralyn Pick. Eden Emms, MD, [facility]  Electronically Signed     PCN/MEDQ  D:  05/03/2007  T:  05/03/2007  Job:  [id number]

## 2010-11-12 NOTE — Discharge Summary (Signed)
NAME:  Carlos Herman, Carlos Herman             ACCOUNT NO.:  [id number]   MEDICAL RECORD NO.:  [id number]          PATIENT TYPE:  INP   LOCATION:  6524                         FACILITY:  [facility]   PHYSICIAN:  Gerrit Friends. Dietrich Pates, MD, FACCDATE OF BIRTH:  April 15, 1941   DATE OF ADMISSION:  08/03/2007  DATE OF DISCHARGE:  08/04/2007                               DISCHARGE SUMMARY   PRIMARY CARDIOLOGIST:  Theron Arista C. Eden Emms, MD, [facility].   DISCHARGE DIAGNOSIS:  Chest pain.   SECONDARY DIAGNOSES:  1. Coronary artery disease, status post CABG in 1996.  2. Hypertension.  3. Hyperlipidemia.  4. Diabetes mellitus.  5. History of transient ischemic attack versus cerebrovascular      accident in June 2005.  6. Status post right total knee arthroplasty.  7. History of spinal cyst, status post excision in October 2005.  8. Status post umbilical hernia repair.   ALLERGIES:  1. PENICILLIN.  2. STATINS.   PROCEDURE:  Left heart cardiac catheterization.   HISTORY OF PRESENT ILLNESS:  A 70 year old Caucasian male with prior  history of CAD, status post CABG, who was in his usual state of health  until the evening of August 02, 2007, when at 11 p.m. he developed  substernal chest discomfort while sitting on his couch.  He took  nitroglycerin and aspirin, with minimal relief.  He then decided to  present to the [facility] ED.  In the ED, cardiac markers were negative,  and ECG showed no acute changes.  He was admitted for further  evaluation.   HOSPITAL COURSE:  The patient underwent left heart cardiac  catheterization on August 03, 2007, revealing a patent vein graft to  the diagonal with an atretic LIMA to the LAD and an occluded vein and  the obtuse marginal, as well as occluded vein graft to the RCA.  He had  patent stents in the proximal and distal RCA.  The decision was made to  pursue medical therapy, as there were no obvious targets for  intervention.  Postprocedure, the patient has not had any  recurrent  chest discomfort and has been ambulating without difficulty.  His blood  pressure has been running in the 140s-150s, and we have titrated Norvasc  to 10 mg daily.  He will be discharged home today in satisfactory  condition.   DISCHARGE LABORATORIES:  Hemoglobin 12.2, hematocrit 36.2, WBC 8.1,  platelets 259, MCV 90.8.  Sodium 140, potassium 3.7, chloride 107, CO2  26, BUN 9, creatinine 0.92, glucose 131.  INR 1.1.  Cardiac markers  negative x2.  Total cholesterol 156, triglycerides 119, HDL 28, LDL 104,  calcium 8.4.  Hemoglobin A1c 7.4.   DISPOSITION:  The patient is being discharged home today in good  condition.   FOLLOW-UP PLANS AND APPOINTMENTS:  He has followup with Dr. Charlton Haws  in our office in [place] on August 16, 2007 at 9:45 a.m..   DISCHARGE MEDICATIONS:  1. Aspirin 81 mg daily.  2. Norvasc 10 mg daily.  3. Imdur 60 mg daily.  4. Cymbalta 6 mg at bedtime.  5. Coreg 12.5 mg b.i.d.  6. Plavix  75 mg daily.  7. Avandamet 1000 mg daily, to be resumed August 05, 2007.  8. Zetia 10 mg daily.  9. Altace 10 mg daily.  10.Morphine 50 mg b.i.d., as previously prescribed.  11.Nitroglycerin 0.4 mg sublingual p.r.n. chest pain.   OUTSTANDING LABORATORY STUDIES:  None.   DURATION OF DISCHARGE ENCOUNTER:  40 minutes, including physician time.      Nicolasa Ducking, ANP      Gerrit Friends. Dietrich Pates, MD, [facility]  Electronically Signed    CB/MEDQ  D:  08/04/2007  T:  08/05/2007  Job:  [id number]

## 2010-11-12 NOTE — Discharge Summary (Signed)
NAME:  Carlos Herman, Carlos Herman NO.:  [id number]   MEDICAL RECORD NO.:  [id number]          PATIENT TYPE:  IPS   LOCATION:  4029                         FACILITY:  [facility]   PHYSICIAN:  Ranelle Oyster, M.D.DATE OF BIRTH:  23-Jan-1941   DATE OF ADMISSION:  10/20/2008  DATE OF DISCHARGE:                               DISCHARGE SUMMARY   DISCHARGE DIAGNOSES:  1. Right 3-part intertrochanteric hip fracture with intramedullary      femoral nailing, October 16, 2008.  2. Coumadin for deep vein thrombosis prophylaxis, pain management,      hypertension, non-insulin dependent diabetes mellitus.  3. History of coronary artery disease with bypass grafting.  4. Hyperlipidemia.  5. Depression.  6. Recent ankle fracture in February 2009.  7. Congestive heart failure.  8. Cerebrovascular accident with little residual.   This is a 70 year old male with history of right total knee replacement  in 2008, congestive heart failure, cerebrovascular accident in 2000, in  which he received inpatient rehab services for, admitted on October 16, 2008, after a fall without loss of consciousness.  Sustained a right 3-  part intertrochanteric hip fracture.  Preoperative clearance by [facility].  Underwent intramedullary femoral nailing on October 16, 2008, per Dr. Dion Saucier.  Coumadin for deep vein thrombosis  prophylaxis, weightbearing as tolerated.  Postoperative pain control.  Recent right ankle fracture with followup x-rays on October 17, 2008,  showing good healing.  Blood pressures monitored with recent Coreg  increased to 18.75 mg twice daily.  The patient was admitted for  comprehensive rehab program.   PAST MEDICAL HISTORY:  See discharge diagnoses.  No alcohol or tobacco.   ALLERGIES:  PENICILLIN.   SOCIAL HISTORY:  Lives with his wife.  His wife works day shift.  He has  a sister who can check on him as needed.  One level home, 6 steps to  entry.  The patient is retired  since 2008.   FUNCTIONAL HISTORY PRIOR TO ADMISSION:  Independent driving.   FUNCTIONAL STATUS UPON ADMISSION TO REHAB SERVICES:  Minimal guard 36  feet with a rolling walker.  Minimal assist for transfers as well as bed  mobility.  Total assist lower body activities of daily living.   MEDICATIONS PRIOR TO ADMISSION:  1. Isosorbide 60 mg daily.  2. Glucophage 500 mg twice daily.  3. Altace 10 mg daily.  4. Norvasc 10 mg daily.  5. Plavix 75 mg daily.  6. Aspirin 81 mg daily.  7. Cymbalta 60 mg daily.  8. Coreg 12.5 mg twice daily.  9. Oxycodone as needed.   PHYSICAL EXAMINATION:  VITAL SIGNS:  Blood pressure 110/52, pulse 74,  temperature 97, and respirations 18.  GENERAL:  This was an alert male, in no acute distress, oriented x3.  NEUROLOGIC:  Deep tendon reflexes were 2+.  Calves remained cool without  any swelling, erythema, or nontender.  Sensation intact to light touch.  Right hip incision healing nicely with Steri-Strips in place.  LUNGS:  Clear to auscultation.  CARDIAC:  Regular rate and rhythm.  ABDOMEN:  Soft and nontender.  Good bowel sounds.   REHABILITATION HOSPITAL COURSE:  The patient was admitted to inpatient  rehab services with therapies initiated on a 3-hour daily basis  consisting of physical therapy, occupational therapy, and rehabilitation  nursing.  The following issues were addressed during the patient's  rehabilitation stay.  Pertaining to Carlos Herman right 3-part  intertrochanteric hip fracture, he had undergone intramedullary nailing  on October 16, 2008.  Surgical site healing nicely with no signs of  infection.  He had received x-ray of the right hip on October 21, 2008,  showing no acute changes, hardware in good position.  Pain management  ongoing with the use of oxycodone for breakthrough pain, OxyContin  sustained release was added to his regimen and slowly titrated to 20 mg  twice daily.  His blood pressures remained well-controlled on Coreg,   Imdur, and Altace with no orthostatic changes.  His blood sugars are  well-controlled with Glucophage as well as diabetic diet.  The patient  remained on Coumadin for deep vein thrombosis prophylaxis with no  bleeding episodes.  He would remain on Coumadin until Nov 15, 2008, and  stop.  It was noted, the patient was on aspirin and Plavix prior to  hospital admission for history of coronary artery disease.  These would  be resumed after Coumadin completed.  He remained on Cymbalta for  history of depression.  He had had a recent ankle fracture in February  2009, this was without issue during his rehabilitation stay.  He had no  bowel or bladder disturbances.   The patient received weekly collaborative interdisciplinary team  conferences to discuss estimated length of stay, any barriers to  discharge and ongoing family teaching.  He was minimal assist to close  supervision for his mobility, simple set up and minimal assist for  activities of daily living needing, and some assist for lower body  dressing.  He exhibited no unsafe behavior, his strength endurance  continued to improve during his rehab course.  Home health therapies  would be ongoing.   LATEST LABORATORIES:  INR of 2.4.  Latest chemistries:  Hemoglobin 11,  hematocrit 32, WBC 9.7, and platelet 341,000.  Sodium 138, potassium  4.2, BUN 10, and creatinine 0.8.   DISCHARGE MEDICATIONS AT TIME OF DICTATION:  1. Coumadin latest dose of 3 mg daily to be completed on Nov 15, 2008.  2. Altace 10 mg daily.  3. Aspirin 81 mg daily.  4. Os-Cal 500 mg twice daily.  5. Coreg 18.75 mg twice daily.  6. Cymbalta 60 mg daily.  7. Imdur 60 mg daily.  8. Norvasc 10 mg daily.  9. Glucophage 1000 mg twice daily.  10.OxyContin sustained release 20 mg twice a day x1 week, then 10 mg      twice a day x1 week and stop.  11.Robaxin 500 mg every 6 hours as needed, spasms.  12.Oxycodone immediate release 5 mg 1-2 tablets every 4 hours as       needed pain, dispense of 90 tablets.   DIET:  Diabetic diet.   SPECIAL INSTRUCTIONS:  Home health nurse for Coumadin therapy to be  drawn with next INR on Oct 30, 2008.  Follow up Dr. Dion Saucier of Orthopedic  Service, call for appointment.      Mariam Dollar, P.A.      Ranelle Oyster, M.D.  Electronically Signed    DA/MEDQ  D:  10/27/2008  T:  10/27/2008  Job:  [id number]   cc:   Deirdre Peer.  Polite, M.D.  Noralyn Pick. Eden Emms, MD, [facility]  Eulas Post, MD

## 2010-11-12 NOTE — H&P (Signed)
NAME:  Carlos Herman, Carlos Herman NO.:  [id number]   MEDICAL RECORD NO.:  [id number]          PATIENT TYPE:  IPS   LOCATION:  4029                         FACILITY:  [facility]   PHYSICIAN:  Ranelle Oyster, M.D.DATE OF BIRTH:  1940/10/24   DATE OF ADMISSION:  10/20/2008  DATE OF DISCHARGE:                              HISTORY & PHYSICAL   CHIEF COMPLAINT:  Right hip pain.   PRIMARY CARE PHYSICIAN:  Dr. Nehemiah Settle.   CARDIOLOGIST:  Noralyn Pick. Eden Emms, MD, [facility].   ORTHOPEDIC SURGEON:  John L. Rendall, MD   HISTORY OF PRESENT ILLNESS:  This is a 70 year old white male with a  history of right total knee replacement, OA, CHF, CAD, and stroke in  2000 who was admitted on April 19 with a fall and without loss of  consciousness.  He sustained a right 3-part intertrochanteric hip  fracture.  Underwent IM nailing on April 19 by Dr. Priscille Kluver after preop  clearance.  Coumadin was added for DVT prophylaxis and he was made  weightbearing as tolerated.  Postop pain control has been an issue.  He  has also had a recent ankle fracture, which has been generally slow to  heal.  Some question of a small subacute avulsion fracture on a x-ray  done on April 20 at the medial malleolus.  Conservative care was still  recommended.  Rehab was asked to see the patient and felt that he could  benefit from an inpatient admission and thus he was brought here today.   REVIEW OF SYSTEMS:  Notable for depression, reflux, occasional cough.  He has had some constipation, relieved by MiraLax.  Other pertinent  positives are above and full review is in the written out history  section of chart.   PAST MEDICAL HISTORY:  Positive for CAD and CABG in 1996; stroke in 2000  with inpatient rehab at that time; CHF; non-insulin-requiring diabetes;  hyperlipidemia; kidney stones; depression; right total knee replacement  in 2008; hernia repair; and right ankle fracture with short-leg cast,  February 2009.   FAMILY  HISTORY:  Noncontributory.   SOCIAL HISTORY:  The patient lives with his wife.  Wife works days.  Sister can check on as needed.  He has 1-level house with 6 steps to  enter.  The patient has been retired since 2008.   ALLERGIES:  PENICILLIN and STATINS.   HOME MEDICATIONS:  1. Isordil.  2. Metformin.  3. Ramipril.  4. Norvasc.  5. Plavix.  6. Aspirin.  7. Cymbalta.  8. Coreg.  9. Oxycodone.   PHYSICAL EXAMINATION:  VITAL SIGNS:  Blood pressure is 110/52, pulse is  74, respiratory rate 18, temperature 98.7.  GENERAL:  The patient is pleasant, alert, and oriented x3.  HEENT:  Pupils are equal, round, and reactive to light.  Nose and throat  exam are notable for borderline dentition.  Pink moist mucosa.  NECK:  Supple without JVD or lymphadenopathy.  CHEST:  Clear to auscultation bilaterally without wheezes, rales, or  rhonchi.  HEART:  Regular rate and rhythm without murmur, rubs, or gallops.  ABDOMEN:  Soft, nontender.  Bowel  sounds are positive.  SKIN:  Generally intact with wound sites on the hip well approximated  and without drainage.  No signs of breakdown were seen elsewhere.  He  has old right knee wounds and ankle was intact.  NEUROLOGIC:  Cranial nerves II through XII are grossly intact.  Reflexes  are 1+.  Sensation may have been a bit decreased in right lower  extremity, but inconsistently so.  His strength was generally 5/5, upper  extremities.  Left lower extremity strength is 3/5 proximal and 4+/5  distally.  Right lower extremity strength is 2-/5 proximal to 3 to 3+/5  distally.  Judgment, orientation, memory, and mood were all generally  appropriate today.   POST ADMISSION PHYSICIAN EVALUATION:  1. Functional deficit secondary to right 3-part intertrochanteric hip      fracture, status post IM nailing, postoperative day #4.  2. The patient is admitted to receive collaborative interdisciplinary      care between the physiatrist, rehab nursing staff, and  therapy      team.  3. The patient's level of medical complexity and substantial therapy      needs in context of that medical necessity cannot be provided at a      lesser intensity of care.  4. The patient has experienced substantial functional loss from his      baseline.  Upon review, the patient was independent, driving prior      to arrival.  Within the last 24 hours, he is min guard assist for      36 feet with rolling walker, min guard transfers, min assist for      bed mobility, total assist for lower body ADLs.  Based on the      patient's diagnosis, physical exam, and functional history, the      patient has potential for functional progress, which will result in      measurable gains while in inpatient rehab.  These gains will be of      substantial and practical use upon discharge to home in      facilitating mobility and self care.  Interim changes since our      preadmission screening are detailed above.  5. Physiatrist will provide 24-hour management of medical needs as      well as oversight of the therapy plans/treatment and provide      guidance as appropriate regarding interaction of the 2.  Medical      problem list and plan are listed below.  6. A 24-hour rehab nursing will assist in managing the patient's bowel      and bladder needs as well as skin care, pain management, nutrition,      medication administration, and integration of therapy concepts and      techniques.  7. PT will assess and treat for lower extremity strength, balance,      adaptive equipment, functional mobility, gait, endurance, and      safety with goals supervision-to-modified independent.  8. OT will assess and treat for upper extremity use, ADLs, adaptive      equipment and techniques, safety awareness with goals, modified      independent to supervision.  9. Case management social worker will assess and treat for      psychosocial issues and discharge planning.  10.Team conferences will  be held weekly to assess progress towards      goals and determine barriers to discharge.  11.The patient has demonstrated sufficient medical stability and  exercise capacity to tolerate at least 3 hours of therapy per day,      at least 5 days per week.  12.Estimated length of stay is 7 days.   PROGNOSIS:  Fair to good.   MEDICAL PROBLEM LIST.:  1. Hypertension:  Continue Coreg, Imdur, and Altace.  Blood pressure      eutensive today.  Adjust accordingly going forward.  Observe with      activities increasing here on the rehab floor.  2. Diabetes:  Glucophage 500 mg b.i.d..  We will check CBGs a.c. and      nightly and adjust accordingly.  Encourage regular intake from a      dietary standpoint.  3. CAD:  Continue aspirin.  Hold Plavix as Coumadin now is on board.      Watch for any bleeding complications.  Follow serial CBCs and      follow clinically.  4. Mood:  Cymbalta.  5. Pain management with oxycodone and Tylenol.  May need to consider      scheduling meds prior to activities in the morning and afternoon.  6. Bowels:  We will schedule regular MiraLax.  Hold for diarrhea.  7. CHF:  Watch weights and check strict I and O.  The patient denies      any shortness of breath or symptoms at this point.      Ranelle Oyster, M.D.  Electronically Signed     ZTS/MEDQ  D:  10/20/2008  T:  10/21/2008  Job:  [id number]

## 2010-11-12 NOTE — Discharge Summary (Signed)
NAME:  Carlos Herman, Carlos Herman             ACCOUNT NO.:  [id number]   MEDICAL RECORD NO.:  [id number]          PATIENT TYPE:  INP   LOCATION:  2024                         FACILITY:  [facility]   PHYSICIAN:  Eulas Post, MD    DATE OF BIRTH:  [birth year]   DATE OF ADMISSION:  10/16/2008  DATE OF DISCHARGE:  10/20/2008                               DISCHARGE SUMMARY   ADMISSION DIAGNOSIS:  Right intertrochanteric hip fracture.   DISCHARGE DIAGNOSES:  1. Right intertrochanteric hip fracture.  2. Right ankle fracture, 2 months status post injury.  3. 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  4. Coronary artery disease status post stenting.  5. Hypertension.  6. Diabetes.  7. History of stroke.  8. History of kidney stones.  9. Midfoot arthritis.   PRIMARY PROCEDURE:  Right hip trochanteric femoral nailing performed on  October 16, 2008.   HISTORY:  Mr. [name] is a 70 year old man with a history of  stroke and a right total knee arthroplasty as well as a recent right  ankle fracture who fell and had a right intertrochanteric hip fracture.  He was admitted and optimized medically and went to surgery for right  trochanteric femoral nailing.  The patient tolerated the procedure well  and there were no complications.  He is weightbearing as tolerated on  his right lower extremity.  He continued to complain of pain in his  right knee with difficulty using it, as well as significant pain in his  ankle and foot, but these have been improving.  X-rays were taken, which  demonstrate a healed distal fibula fracture and midfoot arthritis.   Postoperatively, he remained stable and did not require a blood  transfusion.  He was given perioperative antibiotics for antimicrobial  prophylaxis.  He was on Plavix and a baby aspirin before the surgery.  We have consulted with his cardiologist and they have recommended that  he be on Coumadin for 1 month postoperatively along with  a baby aspirin.  After the 1 month, he can resume his Plavix and aspirin.  He was making  slow progress with physical therapy and rehab consultation was obtained.  He did receive perioperative antibiotics as well as Coumadin for DVT  prophylaxis and a baby aspirin and sequential compression devices.  He  is  planned to be discharged to the rehab facility, weightbearing as  tolerated on the right lower extremity for work to regain function of  the right ankle, knee, and hip.  The patient benefited maximum from this  hospital stay and there were no complications.      Eulas Post, MD  Electronically Signed    JPL/MEDQ  D:  10/20/2008  T:  10/21/2008  Job:  [id number]

## 2010-11-12 NOTE — Assessment & Plan Note (Signed)
[facility]                            CARDIOLOGY OFFICE NOTE   Carlos, Herman                    MRN:          [id number]  DATE:08/16/2007                            DOB:          1940-09-25    Patient is status post right knee surgery.  Rehab has been little bit  slow.  He was admitted to the hospital on August 02, 2006 or for chest  pain.  Heart catheterization showed occlusion of the vein graft to the  right coronary artery, occlusion of the vein graft to the obtuse  marginal branch, and an atretic LIMA.  His LAD and diagonal filled  normally via the diagonal graft.  He had patent stents in the native  right coronary artery with small vessel disease.  No medical therapy was  indicated per Dr. Excell Seltzer.   Since that time has not had any recurrent chest pain.  He is trying to  ambulate more.   REVIEW OF SYSTEMS:  Remarkable for some swelling in the right lower  extremity to be expected after right knee replacement.   His sugars have been under good control.  His medications include  Isordil 60, Altace 10, Zetia 10, baby aspirin, Plavix 75, Cymbalta 60,  morphine p.r.n., amlodipine 10, Avandamet 1 g, carvedilol 12.5 b.i.d.   EXAM:  Remarkable for an overweight, middle-aged white male in no  distress.  Blood pressure is 123/69, pulse 56 regular, weight 240, respiratory 14.  HEENT:  Unremarkable.  Carotids are without bruit, no lymphadenopathy, thyromegaly, JVP  elevation.  LUNGS:  Clear diaphragmatic motion.  No wheezing.  S1-S2 normal heart  sounds.  PMI normal.  ABDOMEN:  Benign.  Bowel sounds positive.  No AAA, no tenderness, No  hepatosplenomegaly or hepatojugular reflux.  No bruit.  He has small  ecchymosis over his cath site with no bruit, no hematoma.  Distal pulse intact, no edema.  He has a recent right knee replacement  scar with +1 edema in the right lower extremity.   IMPRESSION:  1. Coronary disease, recent cath medical  therapy.  Continue aspirin,      beta blocker and Plavix.  2. Status post recent right knee replacement.  Continue rehab and      physical therapy at [facility].  No limitations in regards      to his heart.  3. Hypertension currently well controlled.  Continue current dose of      Altace low-salt diet.  4. Right lower extremity edema.  The patient does not want to      diuretic.  He will continue to elevate the leg at the end of the      day.  There is no evidence of an acute DVT.  Continue to observe      and elevate legs at the end of the day.  5. Overall I think Carlos Herman is doing fairly well.  Despite having an      atretic LIMA his LAD territory fills      normally by retrograde flow through the diagonal.  Medical therapy      is  warranted.  He has no targets for PCI.  I will see him back in 6      months.     Noralyn Pick. Eden Emms, MD, [facility]  Electronically Signed    PCN/MedQ  DD: 08/16/2007  DT: 08/16/2007  Job #: [id number]

## 2010-11-12 NOTE — Assessment & Plan Note (Signed)
[facility]                         GASTROENTEROLOGY OFFICE NOTE   KAGE, WILLMANN                    MRN:          [id number]  DATE:03/04/2007                            DOB:          1941-02-23    CHIEF COMPLAINT:  Followup regarding colonoscopy.   I had seen him in December 2007. At the time he had some anorectal  bleeding mainly with wiping. He also had chest pain and I sent him to  Dr.  Eden Emms to evaluate him with a stress Myoview and that was okay. He  has been cleared to come off of his Plavix for a colonoscopy. It sounds  like he may have had a heme positive stool in the interim as Dr.  Artis Flock  has sent him back over to see me in the office because of his Plavix  use. He is chronically constipated with narcotic therapy for orthopedic  problems. He has switched from oxycodone to morphine sulfate, but still  having problems. He was given what sounds like MiraLax. He took it once  and it had some effect. I explained to him that this is a slow acting  thing and to be used about every day or every other day rather than  intermittently.   CURRENT MEDICATIONS:  Are listed and reviewed in the chart. He is on  Plavix and aspirin and metformin. He is also on Januvia.   PAST MEDICAL HISTORY:  Reviewed and unchanged from my note of June 17, 2006, with those things mentioned above added.   PHYSICAL EXAMINATION:  Obese, in no acute distress. Weight 248 pounds.  Height 5 feet, 9 inches. Pulse 80. Blood pressure 134/68.  LUNGS:  Clear.  HEART: S1, S2 with no murmurs or gallops.   ASSESSMENT:  Hemoccult positive stool in a man with coronary artery  disease which appears to be stable given recent testing. He is on Plavix  which needs to be held. He has been cleared to have this held by Dr.  Eden Emms.   Will schedule colonoscopy. Will hold his diabetes medicines the morning  of the procedure. He will hold his Plavix five days prior, but will  continue his aspirin.     Iva Boop, MD,FACG  Electronically Signed    CEG/MedQ  DD: 03/04/2007  DT: 03/04/2007  Job #: [id number]   cc:   Quita Skye. Artis Flock, M.D.

## 2010-11-12 NOTE — H&P (Signed)
NAME:  CLABORN, JANUSZ             ACCOUNT NO.:  [id number]   MEDICAL RECORD NO.:  [id number]          PATIENT TYPE:  INP   LOCATION:  6524                         FACILITY:  [facility]   PHYSICIAN:  Bevelyn Buckles. Bensimhon, MDDATE OF BIRTH:  [birth year]   DATE OF ADMISSION:  08/03/2007  DATE OF DISCHARGE:                              HISTORY & PHYSICAL   TIME OF ADMISSION:  Admitted at 3:30 a.m.   PRIMARY CARE PHYSICIAN:  Dr. Duffy Rhody C. Wilson.   PRIMARY CARDIOLOGIST:  Dr. Theron Arista C. Nishan.   CHIEF COMPLAINT:  Chest pain and nausea.   HISTORY:  Mr. Petersen is a 70 year old male with a past medical history  of coronary artery disease, status post coronary artery bypass graft  surgery in 1996, and multiple PCI's to the RCA, the last one in  September of 2006, who for two months has had chest pain at rest that  has lasted for 20 minutes, but comes into the emergency room because of  an episode of chest pain that occurred while on the couch, trying to  negotiate a new remote control for his TV, that persisted for longer  than 20 minutes.  It was a 5/10.  It was located on the left side of his  chest.  It did not radiate and only relieved somewhat with nitroglycerin  and aspirin, going from a 5 to a 3.  By __________ he had a lapse and he  had already presented to the emergency room.  He describes the pain  specifically as a pressing down on his left chest.  There was no  radiation.  He did have associated nausea during this episode, but  without emesis.  He denies lightheadedness or diaphoresis.  He says that  over the last two months he has had some episodes of shortness of  breath, though they are not effected by activity.  His episodes of chest  pain over the last two months, have also mostly occurred without  provocation and have gone away on their own with rest or without.   PAST MEDICAL HISTORY:  1. Coronary artery disease, status post coronary artery bypass graft      surgery in  1996, with LIMA to LAD and a vein graft to the diagonal,      a vein graft to OM-I and OM-III and a vein graft to the PDA which      is known to be occluded.  He is also status post PCI to the RCA      with the last procedure being done in September 2006, using a bare      metal stent.  He had a myocardial infarction in October 2005.  He      had a Myoview on June 26, 2006, that was negative for ischemia      and showed an ejection fraction of 59%.  2. He has a history of cerebrovascular disease with TIAs or CVAs in      June 2005, and 2000.  3. He is status post a right total knee arthroplasty on May 03, 2007,  done by Dr. Riley Kill.  4. He has a history of bilateral lower extremity pain.  5. He has hypertension.  6. Hyperlipidemia.  7. Intolerance to STATINS.  8. Diabetes mellitus with his last hemoglobin A1c being 7.5.   PAST SURGICAL HISTORY:  1. A spinal cyst excision in October 2005, after which he developed a      myocardial infarction.  2. Status post an umbilical hernia repair.   ALLERGIES:  PENICILLIN that causes a rash.  He has an intolerance to two  STATINS and will not try another one in the future.   CURRENT MEDICATIONS:  1. Coreg 12.5 mg p.o. twice daily.  2. Zetia 10 mg once daily.  3. Norvasc 5 mg once daily.  4. Isordil 60 mg once daily.  5. Altace 10 mg once daily.  6. Aspirin 81 mg once daily.  7. Plavix 75 mg once daily. Both the aspirin and Plavix it is      recommended that he stay on indefinitely because of his combination      of multiple stents and cerebrovascular disease.  8. Cymbalta 60 mg once daily.  9. One week ago he was changed from Januvia and Glucophage to a      combination of Glucophage and 10. Avandia, though he does not know      the dose.  He is no longer taking Januvia.   SOCIAL HISTORY:  He lives with his wife.  He has a remote history of  cigar smoking.  No alcohol, no drugs, no herbal medicines.   FAMILY HISTORY:  Both  of his parents died at the age of 66.  He has  siblings with no history of coronary artery disease.   REVIEW OF SYSTEMS:  Pertinent for an occasional mild headache over the  past few months.  He has baseline urinary frequency.  He is a full code.  He has had bright red blood per rectum in the past, but he says it is  resolved.  He is being followed by Dr. Iva Boop,  gastroenterology.   PHYSICAL EXAMINATION:  VITAL SIGNS:  T-Max 97.6 degrees, current  temperature 97 degrees, pulse 72, respirations 17, blood pressure on  admission 189/94.  Two hours later after treatment with 12.5 mg of  metoprolol and nitroglycerin ointment, blood pressure dropped to 129/72.  GENERAL:  He is obese, in no apparent distress.  HEENT:  Normal.  NECK:  Supple with no bruits, no jugular venous distention.  He had no  lymphadenopathy.  CARDIOVASCULAR:  S1 and S2.  Somewhat irregular but with occasional  extra beats.  I could not palpate his PMI.  No bruits were audible.  He  had 2+ peripheral pulses.  LUNGS:  Clear to auscultation bilaterally without wheezes or rhonchi.  SKIN:  No rash, no lesions.  ABDOMEN:  Soft, nontender, nondistended.  He said that in the past he  has had some pain on palpation of his abdomen, but not during my exam.  EXTREMITIES:  He had 1+ edema on his right leg, which he says is  chronic.  He has scars from his TKA and from his saphenous vein graft.  His left leg has no edema.  MUSCULOSKELETAL:  Benign.  He had no CVA tenderness.  NEUROLOGIC:  He was alert and oriented x3.  His cranial nerves II-XII  were grossly intact.  Neuro exam nonfocal.   He had a chest x-ray which showed cardiomegaly but without acute  disease.   An  electrocardiogram had a rate of 73, rhythm normal sinus, normal axis,  PR interval 158, QRS 96, QTC 450.  He had no evidence of hypertrophy.  He did have some PVCs.  No ST-T wave changes.  When compared with the  electrocardiogram from November 2008, it  was very similar except that  his PVCs are new.   LABORATORY DATA:  His white blood cell count 8.6, hemoglobin 13,  hematocrit 38, platelets 238.  Sodium 139, potassium 4, chloride 105,  bicarb 28, anion gap of 6, BUN 15, creatinine 0.9, glucose 113.  Cardiac  enzymes point of care:  MB 1.6, the second one 1.4, troponin I both less  than 0.05.   ASSESSMENT/PLAN:  This is a 70 year old male with an extensive history  of coronary artery disease, status post percutaneous coronary  intervention, status post coronary artery bypass graft surgery with one  to two months of chest pain at rest, with the worst chest pain at rest  this evening.  The pain responsive to nitroglycerin.  His  electrocardiogram is unchanged, though he has new premature ventricular  contractions and his enzymes are negative thus far.  With the  combination of symptoms of typical and atypical chest pain in a patient  with a TIMI score of 4, making his risk for a cardiovascular event in 14  days of 20%, we will plan on proceeding with a cardiac catheterization.  Will continue with his current medications, aspirin, Plavix, blood  pressure medications and Coreg, though we will hold his Avandia.  I  question the utility of continuing him on Avandia on discharge.  We will  again risk stratify him, checking a fasting lipid panel, noting that we  cannot start him on a statin.  Recheck his hemoglobin A1c.  Get an idea  of his glycemic control.  Will admit as an observation to telemetry  again, with the plan of performing a cardiac catheterization tomorrow.  Care was discussed with Dr. Bevelyn Buckles. Bensimhon.     ______________________________  Valetta Close, M.D.      Bevelyn Buckles. Bensimhon, MD  Electronically Signed    JC/MEDQ  D:  08/03/2007  T:  08/03/2007  Job:  [id number]

## 2010-11-12 NOTE — Op Note (Signed)
NAME:  Carlos Herman, Carlos Herman NO.:  [id number]   MEDICAL RECORD NO.:  [id number]          PATIENT TYPE:  INP   LOCATION:  2899                         FACILITY:  [facility]   PHYSICIAN:  Mila Homer. Sherlean Foot, M.D. DATE OF BIRTH:  06/28/1941   DATE OF PROCEDURE:  05/03/2007  DATE OF DISCHARGE:                               OPERATIVE REPORT   SURGEON:  Mila Homer. Sherlean Foot, M.D.   ASSISTANT:  __________ PA   ANESTHESIA:  General.   PREOPERATIVE DIAGNOSIS:  Right knee osteoarthritis.   POSTOPERATIVE DIAGNOSIS:  Right knee osteoarthritis.   PROCEDURE:  Right total knee arthroplasty.   INDICATIONS FOR PROCEDURE:  The patient is a 70 year old white male with  failure of conservative measures for osteoarthritis of the right knee.  Informed sent was obtained.   DESCRIPTION OF PROCEDURE:  The patient was laid supine and administered  general anesthesia.  A Foley catheter was placed.  Right leg was prepped  and draped in usual sterile fashion.  A midline incision was made with a  #10 blade approximately 8 inches in length.  A fresh blade was used to  make a median parapatellar arthrotomy and perform a synovectomy.  I then  used the extramedullary alignment system on the tibia to make a  perpendicular cut to the anatomic axis of the tibia.  I removed 2 mm off  the medial side, which was the low side.  I then made an intramedullary  hole in the femur, using the intramedullary guide set on 6 degree valgus  cut; pinned it into place  and made the distal femoral cut with a  sagittal saw.  I then marked out the epicondylar axis and Whitesides  line.  I measured the posterior condylar angle at 3 degrees.  I sized to  a size E, pinned to the 3 created external rotation holes.  We made the  anterior, posterior and chamfer cuts with sagittal saw through the 4-n-1  cutting block.  I then placed a 10-mm spacer block in the knee; had good  flexion/extension and gap balance.  With the 12-mm spacer  block, I then  placed a lamina spreader in the knee and removed the ACL, PCL, medial  and lateral menisci and posterior condylar osteophytes.  Then, once I  had a well balanced knee, I then finished the femur with a size E  finishing block; tibia with a size 5 tibial tray drilling keel.  I then  trialed with a size 5 tibia size E femur, size 12 insert and size 35  patella.  I had good flexion and tension gap balance; dropping the angle  back to 130 degrees -- the patella tracked well.  I then removed the  trial components and copiously irrigated.  I then cemented any  components and removed excess cement, allowed the cement to harden in  extension.  I placed a Hemovac, coming out superolaterally and deep in  the arthrotomy.  A pain catheter came out superomedially and  superficially to the arthrotomy.  I Let tourniquet down after 54  minutes; irrigated again and obtained hemostasis.  I then  closed the  arthrotomy with the figure-of-eight #1 Vicryl sutures.  Deep soft  tissues were buried with 0 Vicryl suture, subcuticular stitch and skin  staples.  Dressed with Xeroform dressing sponges, sterile Webril and TED  stocking.   COMPLICATIONS:  None.   DRAINS:  One Hemovac, one pain catheter.   ESTIMATED BLOOD LOSS:  300 mL.   TOURNIQUET TIME:  54 minutes.           ______________________________  Mila Homer Sherlean Foot, M.D.     SDL/MEDQ  D:  05/03/2007  T:  05/03/2007  Job:  [id number]

## 2010-11-12 NOTE — Consult Note (Signed)
NAME:  Carlos Herman, Carlos Herman NO.:  [id number]   MEDICAL RECORD NO.:  [id number]          PATIENT TYPE:  INP   LOCATION:  3304                         FACILITY:  [facility]   PHYSICIAN:  Lonia Blood, M.D.DATE OF BIRTH:  [birth year]   DATE OF CONSULTATION:  05/03/2007  DATE OF DISCHARGE:                                 CONSULTATION   Consultation from Dr. Georgena Spurling.   REASON FOR CONSULTATION:  Complex medical care in the postop period.   HISTORY OF PRESENT ILLNESS:  Carlos Herman is a very pleasant 70-  year-old gentleman with a complex medical history that is detailed  below.  He has a history of osteoarthritis and was suffering with end-  stage osteoarthritis of the right knee.  He was admitted to the service  of Dr. Georgena Spurling today and subsequently underwent a right total knee  arthroplasty.  He is now in the Step Down Unit recovering and is doing  quite well.  Due to the patient's history of diabetes, hypertension,  hypercholesterolemia and previous CVAs, the [facility]  [facility] was consulted to follow the patient's medical care during his  hospital stay.   At the present time the patient is resting comfortably in his hospital  bed on the Step Down Unit.  He has no complaints of chest pain, fevers,  chills, nausea, vomiting, shortness of breath, abdominal pain, diarrhea.  He states that he tolerated his surgery well.  He has a moderate amount  of pain in the right knee but none beyond what would be expected from  his recent surgery.   REVIEW OF SYSTEMS:  Comprehensive review of systems is unremarkable with  exception to the knee pain as noted above.   PAST MEDICAL HISTORY:  1. Coronary artery disease status post coronary artery bypass graft in      1996 x5 vessels with multiple PTCA and stents subsequently and      known occluded vein graft to the PDA.  2. Diabetes mellitus type 2.  3. Hypertension.  4. Hypercholesterolemia with  intolerance to statins.  5. 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  6. Spinal cyst which was excised October, 2005.  7. Status post umbilical hernia repair.   OUTPATIENT MEDICATIONS:  1. Coreg 12.5 mg p.o. b.i.d.  2. Norvasc 5 mg daily.  3. Isordil 60 mg daily.  4. Altace 10 mg daily.  5. Zetia 10 mg daily.  6. Aspirin 81 mg daily.  7. Plavix 75 mg daily.  8. Cymbalta 60 mg daily.  9. Januvia 60 mg daily.  10.Metformin 1 gram b.i.d.   ALLERGIES:  1. PENICILLINS.  2. STATINS.   FAMILY HISTORY:  Reviewed in multiple old records but noncontributory  and therefore not restated here.   SOCIAL HISTORY:  1. The patient is divorced but has remarried.  2. He did not drink alcohol.  3. He occasionally smokes a cigarette.   DATA REVIEW:  From April 27, 2007 - Hemoglobin 12.6 with an MCV of 92,  white count and platelet count  normal.  BMET is unremarkable.  LFTs are  unremarkable.  Urinalysis is unremarkable.  Chest x-ray reveals no acute  disease.   PHYSICAL EXAM:  Temperature 97, blood pressure 147/58, heart rate 69,  respiratory rate 16, O2 sats 93% on 2 liters per minute nasal cannula.  GENERAL:  A well-developed, well-nourished gentleman in no acute  respiratory distress.  LUNGS:  Clear to auscultation bilaterally without wheezes or rhonchi.  CARDIOVASCULAR:  Regular rate and rhythm without murmur, gallop or rub,  with normal S1 and S2.  ABDOMEN:  Nontender, nondistended, soft, bowel sounds present.  No  hepatosplenomegaly, no rebound, no ascites.  EXTREMITIES:  Trace bilateral lower extremity edema.  Right lower  extremity is currently in a CPM machine.  There is no evidence of  cyanosis.  NEUROLOGIC:  Alert and oriented x4.  Cranial nerves II through XII  intact bilaterally, 5/5 strength bilateral upper extremities and left  lower extremity with the right lower extremity not being tested as it is  immediately  postop.   RECOMMENDATIONS:  1. Diabetes mellitus - I agree with the sliding scale insulin as you      are presently dosing.  Will follow his CBGs closely.  I agree with      a hemoglobin A1c as you have ordered.  We will adjust the patient's      diabetes medications, as indicated, based upon the results of his      frequent CBG monitoring.  2. Hypertension - The patient's blood pressure is currently reasonably      controlled.  I would expect a mild amount of elevation due to his      recent surgery and the pain associated with such.  I agree with the      Coreg that Cardiology has resumed.  We will follow his blood      pressure trend closely and titrate medicines further as      appropriate.  3. Hypercholesterolemia - Zetia should be continued.  It should be      noted the patient is INTOLERANT TO STATINS.  4. History of cerebrovascular accidents - In the setting of known      coronary disease with multiple stents as well as multiple prior      cerebrovascular accidents, resuming the patient's Plavix as soon as      possible is certainly suggested.   Thank you very much for your consultation on this pleasant gentleman.  We will be happy to continue to follow along with you.      Lonia Blood, M.D.  Electronically Signed     JTM/MEDQ  D:  05/03/2007  T:  05/03/2007  Job:  [id number]

## 2010-11-12 NOTE — Consult Note (Signed)
NAME:  Carlos Herman, Carlos Herman NO.:  [id number]   MEDICAL RECORD NO.:  [id number]          PATIENT TYPE:  INP   LOCATION:  2024                         FACILITY:  [facility]   PHYSICIAN:  Pricilla Riffle, MD, FACCDATE OF BIRTH:  05-Nov-1940   DATE OF CONSULTATION:  DATE OF DISCHARGE:                                 CONSULTATION   IDENTIFICATION:  The patient is a 70 year old gentleman who we were  asked to see regarding preoperative risk stratification for femur  fracture.  The patient to have a right hip screw.   HISTORY OF PRESENT ILLNESS:  The patient has known history of coronary  artery disease.  He is normally followed by Dr. Charlton Haws.  He was  last seen in August 01, 2008.  He has a history again of CAD.  He is  status post CABG in 1996 (LIMA to LAD; SVG to D2; SVG to OM-1/OM-2/PDA).  Last cath in February 2009 (LAD 100% prox, D1 was small and severely  diseased 99% prox, left circumflex was severely diseased with 70-80%  lesion; OM-3 was severely diseased; RCA was patent, had multiple stents  overlapping, had 20-30% narrowing, PLSA was patent, PDA was 100%  occluded, PDA filled via collaterals, SVG to diagonal was patent, filled  LAD retrograde, there was a 90% distal LAD lesion, SVG to PDA was 100%  occluded, SVG to circumflex 100% occluded, LIMA to LAD was atretic, and  LVEF was 55%).   He was last seen this year in February, notes occasional chest pain.  He  does not take nitroglycerin for this.  He is active and works at [employer].  His activity is down now from a fall and ankle fracture  earlier this winter.   ALLERGIES:  PENICILLIN and STATINS.  He was on one STATIN that led to  achiness.   PAST MEDICAL HISTORY:  1. Coronary artery disease.  2. Hypertension.  3. Diabetes.  Last hemoglobin A1c 6.83.  4. Dyslipidemia.  LDL by his report was 135.  5. History of TIAs and CVA, last in 2000.  6. History of edema.  7. Umbilical hernia.  8. Kidney  stones.   PAST SURGICAL HISTORY:  Right total knee arthroplasty November 2008,  CABG, hernia repair, and synovial cyst in back.   SOCIAL HISTORY:  The patient is married.  He has 6 children.  Drinks  occasionally.  Does not smoke.   FAMILY HISTORY:  Noncontributory to above problem.   MEDICATIONS:  1. Isosorbide 60.  2. Metformin 500 b.i.d.  3. Ramipril 10.  4. Coreg 12.5 b.i.d.  5. Amlodipine 10.  6. Plavix 75.  7. Aspirin 81.  8. Cymbalta 60 p.r.n.   REVIEW OF SYSTEMS:  All systems are reviewed and negative to the above  problem except as noted above.   PHYSICAL EXAMINATION:  GENERAL:  The patient is in no acute distress at  rest.  VITAL SIGNS:  Temperature is 97.7, blood pressure 137/72, pulse is 80-93  and regular.  HEENT:  Normocephalic and atraumatic.  EOMI.  PERRL.  Mucous membranes  are dry.  NECK:  No bruits.  No JVD.  LUNGS:  Clear to auscultation anteriorly at the bases.  CARDIAC:  Regular rate and rhythm.  Grade 2-3/6 systolic murmur heard  best at the left sternal border radiating to the apex.  No S3.  Occasional skips.  ABDOMEN:  Mild diffuse tenderness.  No masses.  No hepatomegaly.  Normal  bowel sounds.  EXTREMITIES:  Dorsalis pedis 2+ pulses bilaterally.  No lower extremity  edema.  Right leg is rotated outward.   A 12-lead EKG; normal sinus rhythm, 84 beats per minute.  Labs  significant for a hemoglobin of 13.1, WBC of 11.6, BUN and creatinine of  6 and 0.8, and potassium of 4.3.  Chest x-ray portable shows  cardiomegaly, status post CABG.   IMPRESSION:  The patient is a 70 year old with known coronary artery  disease that is severe.  Last catheterization in February 2009 with 1  patent graft and right coronary artery stent patent.  Diffuse native  vessel disease.  He was last seen in February of this year by Dr. Charlton Haws.  No evidence for active ischemia at that time, currently with no  active ischemia, no congestive heart failure on exam.   His labs and EKG  are unremarkable.   1. With his coronary anatomy, he is at some increased risk for      ischemic event.  I do not, however, feel that it is prohibitive for      surgery.  Important would be to keep adequate control of blood      pressure to avoid hypotension, treat pain, continue meds, if he      needs to hold for blood pressure standpoint, hold ACE first.  Okay      to hold Plavix, would resume as soon as possible from a surgical      standpoint.  Continue aspirin.  2. Hypertension, would follow.  3. Diabetes, continue agents.  4. Dyslipidemia.  We will follow up as an outpatient.      Pricilla Riffle, MD, [facility]  Electronically Signed     PVR/MEDQ  D:  10/16/2008  T:  10/17/2008  Job:  [id number]

## 2010-11-12 NOTE — Assessment & Plan Note (Signed)
[facility]                            CARDIOLOGY OFFICE NOTE   WILLIAM, SCHAKE                    MRN:          [id number]  DATE:02/17/2008                            DOB:          21-Apr-1941    HISTORY OF PRESENT ILLNESS:  Carlos Herman returns today for followup.  He is  doing well.  He has had previous coronary artery disease with a CABG in  1996.   He was recently hospitalized in February for chest pain.   His coronary artery disease was deemed stable.  His LIMA to the LAD was  patent.  Vein graft to OM1 and 2 vein graft to the PDA were known to be  occluded.   He has had a PCI of the native RCA in September 2006 using a bare-metal  stent.  His last heart cath, his coronaries were stable.  He has not had  recurrent chest pain.  He continues to have issues in regards to his  diabetes.  Apparently, his Avandamet was recently stopped.  I was glad  about this since Avandia is not a good medicine for him to be on with  his heart disease.  However, he is currently on no medication.  I  believe his last hemoglobin A1c was 6.8.  I told him, I would call him  in simple Glucophage 500 mg a day and he could follow up with Dr. Artis Flock  for further therapy and monitoring.  He had been on Januvia in the past.  I am not sure why it was stopped, but certainly if his blood sugars were  not well controlled, he could have escalating dosages of Glucophage or  start Janumet   Otherwise, he is doing well.  He has chronic lower extremity edema,  which is improving.  He has had a previous right knee replacement and  still has some stiffness and lack of range of motion there.  His wife  continues to work up in [place], the community  is a bit hard and it  gives him less quality time together.   REVIEW OF SYSTEMS:  Otherwise negative.   MEDICATIONS:  He uses the target at [facility] for his drugs.  He is on,  1. Isordil 60 a day.  2. Altace 10 a day.  3. Zetia 10 a  day.  4. Aspirin a day.  5. Plavix 75 a day.  6. Amlodipine 10 a day.  7. Carvedilol 12.5 b.i.d.  8. Glucophage 500 a day.  We restarted his previous dose of Avandamet,      was 1 g b.i.d.   PHYSICAL EXAMINATION:  GENERAL:  Remarkable for an overweight white  male, in no distress.  VITAL SIGNS:  His weight is 247, blood pressure 110/70, pulse 56 and  regular, respiratory rate 14, afebrile.  HEENT:  Unremarkable.  NECK:  Carotids are normal without bruit, no lymphadenopathy, no  thyromegaly, no JVP elevation.  LUNGS:  Clear.  Good diaphragmatic motion.  No wheezing.  S1 and S2.  Normal heart sounds.  PMI normal.  ABDOMEN:  Benign.  Bowel sounds positive.  No AAA,  no tenderness, no  bruit, no hepatosplenomegaly, no hepatojugular reflux, no tenderness.  EXTREMITIES:  Distal pulses are intact, trace edema.  NEURO:  Nonfocal.  SKIN:  Warm and dry.  He has a large scar in the medial aspect of his  left knee with decreased range of motion.   IMPRESSION:  1. Coronary artery disease, previous coronary artery bypass graft with      occluded grafts, bare-metal stent to the right coronary artery.      Continue aspirin, Plavix, and Coreg.  2. Diabetes.  Follow with Dr. Artis Flock.  Glucophage 500 a day to be      started.  I agree with discontinuing Avandia.  3. Hypertension.  Currently well controlled.  Continue current dose of      Altace and low-salt diet  4. Previous transient ischemic attacks.  Continue aspirin and Plavix.  5. Lower extremity edema.  Currently stable.  P.r.n. diuretics.  6. Previous knee replacement.  Continue rehab.  I will see Chae back      in 6 months.     Noralyn Pick. Eden Emms, MD, [facility]  Electronically Signed    PCN/MedQ  DD: 02/17/2008  DT: 02/18/2008  Job #: [id number]

## 2010-11-15 NOTE — Cardiovascular Report (Signed)
NAME:  BIRD, SWETZ NO.:  [id number]   MEDICAL RECORD NO.:  [id number]          PATIENT TYPE:  OIB   LOCATION:  6532                         FACILITY:  [facility]   PHYSICIAN:  Charlies Constable, M.D. [facility] DATE OF BIRTH:  Nov 14, 1940   DATE OF PROCEDURE:  03/05/2005  DATE OF DISCHARGE:                              CARDIAC CATHETERIZATION   INDICATIONS:  Mr. Pavey is 70 years old and has had previous bypass  surgery in 1996 and has had two stents placed in the right coronary artery  in 2005.  He had a Cardiolite scan which showed inferior scar with a small  area of peri-infarction ischemia.  He underwent catheterization last week  with Dr. Gala Romney.  His LIMA to the LAD was atretic.  His vein graft to the  diagonal was open and supplied both the diagonal and LAD.  His vein graft to  the marginal and posterolateral branch of the circumflex artery was open.  His vein graft to the right had been previously occluded and he had two  patent stents in the mid and distal right coronary artery, but an 80%  stenosis in the proximal right coronary artery which was the target vessel  we brought him back for intervention on today.   PROCEDURE:  The procedure was performed via the right femoral artery using  arterial sheath and 6-French preformed coronary catheters.  A front wall  arterial puncture was performed and Omnipaque contrast was used.  We used a  6-French JR4 guiding catheter with side holes and a Prowater wire.  Patient  was given Angiomax bolus and infusion and was given additional 300 mg load  of Plavix.  We crossed the lesion in the proximal right coronary artery with  the Prowater wire without difficulty.  We direct stented with a 3 x 8 mm  Liberte stent deploying this with one inflation of 14 atmospheres for 30  seconds.  We post dilated with a 3.75 x 8 mm Quantum Maverick performing one  inflation of 15 atmospheres for 30 seconds.  Final diagnostic studies were  then  performed through the guiding catheter.  Patient tolerated the  procedure well and left the laboratory in satisfactory condition.   RESULTS:  Initially the stenosis in the proximal right coronary artery was  estimated at 80%.  Following stenting this improved to less than 10%.  There  was residual disease in the posterior descending branch of the right  coronary artery but this was diffusely diseased and we elected not to treat  this.   CONCLUSION:  Successful PCI of the stenosis in the proximal native right  coronary artery using a bare metal stent with improvement in the center of  narrowing from 80% to less than 10%.   DISPOSITION:  Patient will return to postanesthesia care unit for further  observation.           ______________________________  Charlies Constable, M.D. [facility]     BB/MEDQ  D:  03/05/2005  T:  03/05/2005  Job:  [id number]   cc:   Charlton Haws, M.D.  [address]  [place]  [place] [address]   Vale Haven. Andrey Campanile, M.D.  [address]  [place]  [place] [address]  Fax: [phone]   [facility]

## 2010-11-15 NOTE — Assessment & Plan Note (Signed)
[facility]                            CARDIOLOGY OFFICE NOTE   Carlos, Herman                    MRN:          [id number]  DATE:06/19/2006                            DOB:          [birth year]    Carlos Herman was seen today as an add-on.   He has a history of coronary artery bypass grafting.   He has had problems with occlusion of the vein graft to the right.   I believe his bypass was in 1996 with LIMA of the LAD, vein graft to  diagonal, vein graft to the PDA and a vein graft to the OM.   He has had stenting of the native RCA.   He apparently was being setup for a colonoscopy and he told the GI  doctor about his significant episode of chest pain last Friday.  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  It was central.  He took an aspirin. He did not have  nitroglycerin with him and it subsided in about 20 minutes.   He has not had any other episodes in general but is still active with  his cars and working.  He has not had any exertional chest pains.   He has been compliant with his medications.   He has had previous CVAs and has been maintained on aspirin and Plavix.   Coronary risk factors include hypertension and diabetes as well as  hypercholesterolemia.   MEDICATIONS:  Include:  1. Toprol 25 a day.  2. Norvasc 5 a day.  3. OxyContin p.r.n.  4. Isosorbide 60 a day.  5. Altace 10 a day.  6. Zetia 10 a day.  7. Baby aspirin a day.  8. Plavix 75 a day.  9. Cymbalta 600 a day.  10.Metformin 100 b.i.d.  11.Celebrex 200 a day.   His EKG today in the office was essentially normal with no acute  changes.   EXAMINATION:  VITALS:  The blood pressure is 120/70.  Pulse is 70 and  regular.  HEENT:  Is normal.  NECK:  There is no carotid bruits.  No lymphadenopathy.  LUNGS:  Are clear.  HEART:  There is an S1, S2 with normal heart sounds.  ABDOMEN:  Is benign.  LOWER EXTREMITIES:  Intact  pulses and trace edema.  He has a mild eschar  on the right shin from a bruise.   IMPRESSION:  Coronary artery disease with bypass in 1996.  Isolated  episode of chest pain.  Continue aspirin and Plavix.  The patient will  have a stress Myoview next Wednesday.   He will carry his nitroglycerin with him and knows to go to the  emergency room if he were to have any recurrent pain.  I was reassured  that his exam and EKG were normal.   His risk factors are well modified.  We will have to stop his metformin  if he needs a repeat heart cath.   The patient will continue his Altace as his blood pressure is well  controlled.  He will also continue his  aspirin and beta blocker given  his known coronary disease.  Since he has stenting of the native right  coronary artery for a failed graft, he will continue his aspirin and  Plavix.   Further recommendations will be based on the results of his Myoview.     Noralyn Pick. Eden Emms, MD, [facility]  Electronically Signed    PCN/MedQ  DD: 06/19/2006  DT: 06/19/2006  Job #: [id number]

## 2010-11-15 NOTE — Discharge Summary (Signed)
NAME:  Carlos Herman, Carlos Herman                       ACCOUNT NO.:  [id number]   MEDICAL RECORD NO.:  [id number]                   PATIENT TYPE:  INP   LOCATION:  3001                                 FACILITY:  [facility]   PHYSICIAN:  Renato Battles, M.D.                  DATE OF BIRTH:  10/27/1940   DATE OF ADMISSION:  12/12/2003  DATE OF DISCHARGE:  12/14/2003                                 DISCHARGE SUMMARY   DISCHARGE DIAGNOSES:  1. Transient ischemic attack, resolved.  2. Hypertension, controlled.  3. Type 2 diabetes.  4. Bradycardia, resolved.  5. Hyperlipidemia with LDL over 100, despite being on Zetia; however, the     patient has an intolerance to statins.  6. Chronic right foot pain on chronic oxycodone.  7. Coronary artery disease, status post coronary artery bypass graft.   DISCHARGE MEDICATIONS:  1. Aggrenox one tab p.o. every day for the next two weeks, then twice a day.  2. Oxycodone 5 mg p.o. q.i.d. p.r.n.  3. Senokot S one tab p.o. q.h.s. p.r.n.  4. Avandia 8 mg p.o. every day.  5. Toprol XL 25 mg p.o. every day.  6. Imdur 60 mg p.o. every day.  7. Zetia 10 mg p.o. every day.  8. Norvasc 5 mg p.o. every day.  9. Altace 10 mg p.o. every day.   CONSULTS:  Pramod P. Pearlean Brownie, M.D. from the stroke team, neurology.   PROCEDURES:  1. Head MRI/MRA, dated December 12, 2003, showed occluded right vertebral artery     but no significant stenosis of the carotids.  2. MRI of the brain, dated December 12, 2003, showed no acute intracranial     abnormality, remote left posterior parietal infarction with     encephalomalacia changes.  3. Carotid Doppler showed no carotid stenosis bilaterally.   HISTORY AND PHYSICAL AND HOSPITAL COURSE:  The patient is a very pleasant 70-  year-old white male who presented to the emergency department complaining of  dizziness and feeling off balance.  Given his history of prior stroke, this  was felt to be serious.  Physical exam revealed normal vital signs.  Neurologic exam showed no significant findings.  The patient was admitted  for treatment of TIA and dizziness.  MRI and MRA were ordered.  A Doppler  was ordered as well as fasting lipids.  The patient's symptoms resolved  spontaneously, gradually over the next 48 hours.  At the time of discharge,  he was completely recovered.  Fasting lipids revealed elevated LDL at 110,  despite being on Zetia; however, when I started the patient on Zocor, he  said he was told he can not tolerate this medication by his doctor, so this  was discontinued.  His blood pressure was stable.  He had bradycardia during  his hospitalization which resolved after a discrete dose of Toprol 225 mg  daily.  Also his blood sugar was poorly controlled and  hemoglobin A1c was  elevated at 7.1, so Avandia was increased to 8 mg daily.  Neurologic consult  was obtained, and the patient was discontinued from Plavix and aspirin and  started on Aggrenox instead.  His hospitalization was otherwise uneventful.  He had a workup for anemia because when he presented his hemoglobin was a  little low but this resolved spontaneously and all the workup came back  negative with normal folate and B-12, also normal ferritin levels.   DISCHARGE INSTRUCTIONS:  Low-fat diet.   ACTIVITY:  As tolerated.                                                Renato Battles, M.D.    SA/MEDQ  D:  12/14/2003  T:  12/15/2003  Job:  [id number]   cc:   Pramod P. Pearlean Brownie, MD  Fax: [phone]   Vale Haven. Andrey Campanile, M.D.  [address]  [place]  [place] [address]  Fax: [phone]

## 2010-11-15 NOTE — Discharge Summary (Signed)
NAME:  Carlos Herman, Carlos Herman             ACCOUNT NO.:  [id number]   MEDICAL RECORD NO.:  [id number]          PATIENT TYPE:  OIB   LOCATION:  6532                         FACILITY:  [facility]   PHYSICIAN:  Arturo Morton. Riley Kill, M.D. [facility] OF BIRTH:  1941/01/08   DATE OF ADMISSION:  03/05/2005  DATE OF DISCHARGE:  03/06/2005                                 DISCHARGE SUMMARY   PRIMARY CARE PHYSICIAN:  Duffy Rhody C. Andrey Campanile, M.D.   PRIMARY CARDIOLOGIST:  Charlton Haws, M.D.   PRINCIPAL DIAGNOSIS:  Coronary artery disease.   OTHER DIAGNOSES:  1.  Hypertension.  2.  Hyperlipidemia.  3.  Type 2 diabetes mellitus.  4.  Obesity.  5.  Osteoarthritis.   HISTORY OF PRESENT ILLNESS:  A 70 year old white male with a prior history  of CAD and status post CABG in 1996 as well as PCI stenting of the RCA x2 in  October 2005 who has recently been experiencing progressive fatigue as well  as some atypical chest pain.  He recently underwent a functional study which  revealed an EF of 52% and a small infarct at the base of the inferior wall  in the inferior septal region and in the apex which was predominantly fixed.  There was the question of slight reversibility at the lateral margin as well  as evidence of transient ischemic dilatation and, therefore, he was referred  for cardiac catheterization which took place on February 28, 2005, revealing  an 80% stenosis in the proximal RCA with previously placed stents being  patent.  He otherwise has multi-vessel disease with patent grafts with the  exception of a vein graft to the PDA which has previously been known to be  occluded.   HOSPITAL COURSE:  The patient came back as an outpatient and was taken to  the cath lab on March 05, 2005 for PCI and stenting of the proximal RCA  with successful placement of a 3.0 x 8 mm bare metal stent.  He tolerated  the procedure well and following the procedure his site was closed with an  AngioSeal device.  He has been able  to ambulate in the hallways without  limitation or recurrent discomfort and he has had no groin complications.  He is being discharged home today in satisfactory condition.   DISCHARGE LABORATORY DATA:  Hemoglobin 11.9, hematocrit 34.3, WBC 6.4,  platelets 226,000.  Sodium 138, potassium 3.7, chloride 105, CO2 27, BUN 13,  creatinine 1.0, glucose 148.   DISPOSITION:  The patient is being discharged home in good condition.   FOLLOW-UP PLANS AND APPOINTMENT:  The patient has follow-up with Dr. Eden Emms  on March 14, 2005 at 4:15 p.m.   DISCHARGE MEDICATIONS:  1.  Aspirin 325 mg daily.  2.  Plavix 75 mg daily.  3.  Altace 10 mg daily.  4.  Zetia 10 mg daily.  5.  Isosorbide mononitrate 60 mg daily.  6.  Avandia 8 mg daily.  7.  Celebrex 200 mg daily.  8.  Norvasc 5 mg daily.  9.  Cymbalta 60 mg daily.  10. Nitroglycerin 0.4 mg sublingual p.r.n. chest pain.  Outstanding lab studies:  None.   Duration of discharge encounter:  40 minutes, including physician time.      Ok Anis, NP      Arturo Morton. Riley Kill, M.D. [facility]  Electronically Signed    CRB/MEDQ  D:  03/06/2005  T:  03/06/2005  Job:  [id number]   cc:   Vale Haven. Andrey Campanile, M.D.  Fax: [phone]   Charlton Haws, M.D.  [address]  [address]  [place]  [place] [address]

## 2010-11-15 NOTE — Assessment & Plan Note (Signed)
[facility]                            CARDIOLOGY OFFICE NOTE   Carlos Herman, Carlos Herman                    MRN:          [id number]  DATE:06/29/2006                            DOB:          1940/12/18    Carlos Herman returns today for follow-up.  I congratulated him, Christmas night  he got engaged to Carlos Herman, who is his girlfriend that I had  previously met from [place], [place].  Carlos Herman has been doing okay.  He has a history of CABG in 1996, with PTCA and stenting of the native  right in 1999.   His EF was in the 52% range.  He had a stress Myoview done June 26, 2006, which was normal with an EF of 59%.  Carlos Herman has been doing better.  His weight is down about 6 pounds.   REVIEW OF SYSTEMS:  He continues to have a few medical problems.  He  needs a follow-up colonoscopy with Dr. Leone Herman.  This was put off due to  his cardiac condition and he is now cleared.  He also has had chronic  right knee problems and probably needs surgery for this.  I believe he  is going to see Dr. Sherlean Herman.  He has a lot of pain in his right lower  extremity from the previous TIA and also neuropathy.  The neuropathy may  also be related to his saphenous vein harvest site on that side.   MEDICATIONS:  Listed in the chart.  They include  1. Aspirin daily.  2. Plavix 75 daily.  He has been maintained on both due to his stenting and TIAs.   I do not have a recent hemoglobin A1c and he is on metformin 1 g b.i.d.   PHYSICAL EXAMINATION:  GENERAL APPEARANCE:  He looks well.  HEENT:  Normal.  NECK:  There is no carotid bruit.  No JVP elevation.  LUNGS:  Clear.  CARDIOVASCULAR:  There is an S1 and S2 with distant heart sounds.  ABDOMEN:  Benign.  EXTREMITIES:  Distal pulses are intact.  He has some +1 edema in the  right lower extremity and some erythema near the saphenous vein harvest  site.   IMPRESSION:  Stable, status post coronary artery bypass grafting with  residual  stenting of the native right coronary artery.  Continue aspirin  and Plavix.  At some point in the future, we may switch his Toprol to  Coreg given his diabetes.  He is cleared for any type of surgery that he  may need.  I encouraged him to follow up with Dr. Sherlean Herman in regards to  knee replacement since he is quite bothered by his knee pain.  He is  also cleared to have a screening colonoscopy with Dr. Leone Herman.   If need be, the patient can stop his aspirin and Plavix five days before  the colonoscopy.   The patient is not looking forward to his prep.   Overall, I think Carlos Herman's cardiac status is stable and I will see him  back in six months.     Noralyn Pick. Eden Emms, MD, [facility]  Electronically Signed    PCN/MedQ  DD: 06/29/2006  DT: 06/29/2006  Job #: [id number]   cc:   Mila Homer. Carlos Herman, M.D.  Iva Boop, MD,FACG

## 2010-11-15 NOTE — Discharge Summary (Signed)
NAME:  Carlos Herman, Carlos Herman             ACCOUNT NO.:  [id number]   MEDICAL RECORD NO.:  [id number]          PATIENT TYPE:  INP   LOCATION:  6523                         FACILITY:  [facility]   PHYSICIAN:  Arturo Morton. Riley Kill, M.D. [facility] OF BIRTH:  [birth year]   DATE OF ADMISSION:  04/25/2004  DATE OF DISCHARGE:  04/26/2004                                 DISCHARGE SUMMARY   PRIMARY CARE PHYSICIAN:  Dr. Margrett Rud in [place]   CARDIOLOGIST:  Dr. Charlton Haws   DIAGNOSES:  1.  Status post cardiac catheterization with successful drug-eluting stent      placement in the native right coronary artery.  2.  Myocardial infarction.   PAST MEDICAL HISTORY:  1.  Recent neurosurgery involved in removal of spinal cyst.  2.  Diabetes.  3.  CVA.  4.  Hypertension.  5.  Right foot pain.  6.  Dyslipidemia with Statin intolerance.  7.  Degenerative joint disease of the right knee.  8.  Status post spinal cyst surgery.  9.  Umbilical hernia repair.   HISTORY OF PRESENT ILLNESS:  A 70 year old gentleman who presented with  episode of chest pain while watching a ball game.  Arrived to emergency  room.  EKG without abnormality.  Enzymes slightly elevated.  Blood pressure  126/57 with a pulse of 70.  Initial laboratories showed a potassium of 4.1,  BUN 14, glucose 143, hematocrit 37.  EKG showed normal sinus rhythm with a  slightly prolonged QT, otherwise negative.  Patient was admitted.  Cardiac  status:  Chest pain, positive MI, cycled cardiac enzymes.  Dr. Riley Kill  initially saw patient.  Concerned about recent neurosurgery.  He called Dr.  Yetta Barre concerning the use of heparin this soon after neurosurgery.  Dr. Yetta Barre  stated should be okay with the heparin.  No bolus given.  Patient to cardiac  catheterization laboratory on the 27th by Dr. Samule Ohm.  Treated initially  with 300 of Plavix and then Integrilin.  Continued IV heparin prior to  catheterization.  EF 65% with regional wall motion  abnormality.  No aortic  stenosis or mitral regurgitation.  Results as stated above.  Plan to  continue patient on aspirin and Plavix and discontinue Aggrenox.  Dr. Eden Emms  in to see patient on the morning of the 28th.  Patient without complaints of  discomfort.  Blood work:  Potassium 4.0, glucose 162, BUN 6, creatinine 0.9,  hemoglobin 12.9, hematocrit 37.9, WBC 9.2.   DISPOSITION:  Patient being discharged home, doing well after PCI.  Blood  pressure well controlled.  With history of CVAs we are going to discontinue  the Aggrenox and use aspirin and Plavix.  Dyslipidemia:  Continue Zetia.  Intolerant to Statins.  Patient instructed to continue all other medications  without Aggrenox.   PAIN MANAGEMENT:  Tylenol for general discomfort or as previously  instructed.   ACTIVITY:  No driving or strenuous activity for two days.   DIET:  Low fat, low salt, diabetic diet.   WOUND CARE:  Gently clean catheterization site with soap and water.  No tub  bathing or swimming x1  week.  He is instructed to call our office for any  swelling, bleeding, pain, fever from catheterization site.   FOLLOWUP:  He has a follow-up appointment with [facility] next week.       MB/MEDQ  D:  04/26/2004  T:  04/26/2004  Job:  [id number]   cc:   Charlton Haws, M.D.   Dr. Yetta Barre

## 2010-11-15 NOTE — Discharge Summary (Signed)
[place]  Patient:    ERYCK, NEGRON Visit Number: [id number] MRN: [id number]          Service Type: MED Location: [phone] Attending Physician:  Junious Silk Dictated by:   Rozell Searing, P.A. Admit Date:  12/18/2001 Discharge Date: 12/20/2001   CC:         Duffy Rhody C. Andrey Campanile, M.D.   Referring Physician Discharge [place]   No dictation. Dictated by:   Rozell Searing, P.A. Attending Physician:  Junious Silk DD:  12/20/01 TD:  12/20/01 Job: [id number]

## 2010-11-15 NOTE — Discharge Summary (Signed)
NAME:  Carlos Herman, Carlos Herman             ACCOUNT NO.:  [id number]   MEDICAL RECORD NO.:  [id number]          PATIENT TYPE:  INP   LOCATION:  5009                         FACILITY:  [facility]   PHYSICIAN:  Mila Homer. Sherlean Foot, M.D. DATE OF BIRTH:  02/08/1941   DATE OF ADMISSION:  05/03/2007  DATE OF DISCHARGE:  05/07/2007                               DISCHARGE SUMMARY   ADMISSION DIAGNOSIS:  Osteoarthritis of the right knee.   DISCHARGE DIAGNOSES:  1. Osteoarthritis of the right knee.  2. Coronary artery disease with a history of coronary artery bypass      graft and myocardial infarction x2.  3. Hypertension.  4. Diabetes mellitus.  5. History of cerebrovascular accident.  6. Postoperative hemorrhagic anemia.  7. Sleep apnea.  8. Obesity.   PROCEDURE:  Right total knee arthroplasty.   HISTORY:  Carlos Herman is a 70 year old white male with an 8-year  history of questionable patella fracture in the 1970s with right knee  constant pain.  He has pain that radiates down to his ankle and into his  foot.  He does have mechanical locking.  Cortisone has been injected  without relief.  He is not using any assistive device at this time.  His  pain is worse with sitting and with the knee bent.  He has pain that  causes him to wake at night.  X-rays reveal tricompartment DJD.  He is  admitted at this time for total knee arthroplasty.   HOSPITAL COURSE:  A 70 year old white male admitted on May 03, 2007.  After appropriate laboratory studies were obtained and 600 mg of  clindamycin IV, and a femoral nerve block, he was taken to the operating  room where he underwent a right total knee arthroplasty by Mila Homer.  Sherlean Foot, M.D. assisted by Oris Drone. Petrarca, P.A.-C.  He tolerated the  procedure well.  He was placed on a 60-gram carb diet.  He continued  with clindamycin 600 mg IV q.8 h. x3 doses.  He was started on Lovenox  30 mg subcutaneous q.12 h. on November 4th at 8 a.m.  Zofran was used  for  nausea.  Dilaudid PCA pump was used for pain management.  A Foley  was placed intraoperatively.  Hemovac was implanted for 4 hours then  charged.  CPM was ordered 0-90 degrees for 6-8 hours per day.  Consults  with PT, OT, and care management.  Weightbearing as tolerated.  He was  placed in a monitored bed.  He did well.  He was transferred back to  5000.  He was allowed out of the bed to a chair on post-op day #1.  Instructed in Lovenox injections.  He was started on Plavix 75 p.o.  daily.  His dressing was changed.  Apparently he had some difficulty  with decreased O2 sats.  A STAT ABG was ordered as well as a chest x-  ray.  UA, culture, and CT of the head without contrast and blood  cultures x2.  These were ordered by medicine.  He did have some what was  felt to be some mental status change.  This was noted to not be an acute  episode.  The remainder of his hospital course was uneventful and he was  discharged on the 7th to return back to the office in 2 weeks for a  recheck evaluation __________  .    EKG revealed a normal sinus rhythm with nonspecific T wave changes.   RADIOGRAPHIC STUDIES:  A CT of the head revealed no evidence of  intracranial abnormality.  There was atrophy with chronic small vessel  white matter ischemic changes and a remote left frontoparietal infarct.  Chest x-ray of November 5, revealed vascular congestion without overt  pulmonary edema.  __________  atelectasis, cervical trach.  There was  enlargement of the cardiopericardial silhouette.   Blood gasses revealed a pH of 7.468, pCO2 35.2, pO2 64.6, bicarb is  25.2, total CO2 26.2, base excess 1.8, sat 94.4% and he was on 2.5  liters at that time.  Hemoglobin 12.6, hematocrit 37.4%, white count  8,500, platelets 259,000.  Discharge hemoglobin 9.9, hematocrit 28.7%,  white count 11.8, and platelets 278,000.  Pre-op sodium 138, potassium  4.4, chloride 106, CO2 26, glucose 126, BUN 15, creatinine 0.17.  GFR  is  greater than 60.  Total protein 6, albumin 3.6, AST 19, ALT 17, ALP 83,  total bilirubin 0.24 .4.  Discharge sodium 136, potassium 3.5, chloride  100, CO2 27, glucose 158, BUN 12, creatinine 0.87.  Glycosylate  hemoglobin was 7.2.  Urinalysis, __________  , November 5, revealed 0-2  white, 0-2 reds, rare bacteria.  Blood type A negative.  Antibody screen  negative.  Blood cultures x2 showed no growth.  A urine culture pre-op  was no growth of May 05, 2007, revealed 25,000 Klebsiella  pneumoniae.   DISCHARGE INSTRUCTIONS:  1. Continue with diet as tolerated at home.  It should be a diabetic      diet.  2. Increase activity slowly.  3. Use crutches.  4. Weightbearing as tolerated.  5. He may shower Friday.  6. No lifting or driving for 6 weeks.  7. Follow wound instruction sheet.  8. Change dressing daily.   He was given prescriptions for:  1. Percocet 5/325, 1-2 tabs every four hours as needed for pain.  2. Robaxin 500 mg one p.o. q.6 h. p.r.n. for spasm.  3. Lovenox 40 mg daily at 8 a.m. as instructed.   Follow back up with Dr. Sherlean Foot on May 19, 2007.   Discharge in improved condition.      Oris Drone Petrarca, P.A.-C.    ______________________________  Mila Homer. Sherlean Foot, M.D.    BDP/MEDQ  D:  06/21/2007  T:  06/21/2007  Job:  [id number]

## 2010-11-15 NOTE — Consult Note (Signed)
NAME:  Carlos Herman, Carlos Herman                       ACCOUNT NO.:  [id number]   MEDICAL RECORD NO.:  [id number]                   PATIENT TYPE:  INP   LOCATION:  3001                                 FACILITY:  [facility]   PHYSICIAN:  Casimiro Needle L. Thad Ranger, M.D.           DATE OF BIRTH:  07-Sep-1940   DATE OF CONSULTATION:  12/12/2003  DATE OF DISCHARGE:                                   CONSULTATION   REASON FOR EVALUATION:  Suspect stroke.   HISTORY OF PRESENT ILLNESS:  This is an in-patient consultation evaluation  of this existing [facility] patient.  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  She had seen Dr. Katrinka Blazing for this in the past.  She has been  maintained on aspirin and Plavix.  The patient reports that he was in his  usual state of pretty good health until last night.  He had taken his  nighttime medication and was lying in bed when he felt a sensation of sudden  onset which he finds difficult to describe.  He says it was like a popping,  but he did not really hear anything, and he felt a little bit lightheaded.  He ascribed it to his blood pressure medications.  However, when he woke up  the next morning, he was having considerable difficulty getting around.  He  denied having any vertigo.  Perhaps he had a little lightheadedness, but  mostly he was very unstable on his feet.  He denies any particular  clumsiness in one side or the other or any weakness, numbness or  paresthesias involving the extremities.  He also notes that he has had  double vision most of the day.  After having difficulty when taking a shower  this morning, he called EMS and was brought to the hospital for further  evaluation.  On further questioning, he does report a little bit of a mild  headache, but denies any dysarthria, dysphagia or any lymph symptoms  as  noted above.  He also denies nausea.  He currently states that his symptoms  are about the same.  He has not tried to ambulate much during the day today  due to being off the floor for a lot of tests.  He does feel, washed out  at this time.  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  He has known coronary artery disease with a history of coronary artery  bypass graft in 1996.  He had a good ejection fraction at cardiac  catheterization in March of 2004.  He also has hypertension, diabetes,  hyperlipidemia.   FAMILY/ SOCIAL/ REVIEW OF SYSTEMS:  As outlined in the admission H&H by Dr.  Sherrie Mustache.  MEDICATIONS:  1. Baby aspirin.  2. Plavix.  3. Altace.  4. Toprol.  5. Norvasc.  6. Avandia.  7. Zetia.  8. Oxycodone 5 mg q.i.d.  9. Isosorbide.   PHYSICAL EXAMINATION:  VITAL SIGNS:  Temperature 98.3, blood pressure  150/71.  Pulse 70.  Respiratory rate 20.  O2 saturations 93% on room air.  THROAT EXAMINATION:  This is a healthy-appearing man, supine in the hospital  bed in no evident distress.  HEENT:  Cranial is normocephalic, oropharynx is benign.  NECK:  Supple without carotid or supraclavicular bruits.  HEART:  Regular rate and rhythm without murmurs.  NEUROLOGIC:  Mental status:  He is awake, alert, oriented to time, place,  person.  Recent and remote memory are intact.  Attention span,  concentration, and fund of knowledge are appropriate.  Speech is fluent and  not dysarthric.  Mood is euthymic, and affect appropriate.  Cranial nerves:  Fundi are poorly visualized.  Pupils are equal and reactive.  Examination of  extraocular movements demonstrates some upward deviation of the right eye  with a reported diplopia, worse on the right with gaze which is horizontal  on right upper gaze and more vertical in the right downward gaze.  Visual  fields are full to confrontation.  Hearing is  intact with symmetric finger  rub.  The patient's sensation intact to pinprick.  Face, tongue and palate  was normal symmetrically.  Shoulder shrug strength is normal.  Motor  testing, normal bulk and tone, normal strength in all tested extremity  muscles.  Sensation intact to light touch and pinprick in all extremities.  Coordination, rapid, lids are full and well.  Finger-to-nose is performed  well.  He has a little bit of performing heel to shin with the right foot,  but says he is not sure this is not due to a right knee problem.  Gait:  He  feels a little bit wild when he situs up at the sit of the bed but can do  this.  He stands with a wide station that is unsteady with ambulating with a  tendency to fall towards the left.  Reflexes are 2+ and symmetric.  Toes are  downgoing.   LABORATORY DATA:  CBC:  White count 6.7, hemoglobin 12.6, platelets 211,000  with a normal differential.  CMET is remarkable for an elevated glucose of  172, low protein and albumin with appropriate calcium level, Prothrombin  time is subnormal and 21.  Cardiac enzymes are negative.  Urinalysis is  negative.  CT of the head was personally reviewed and demonstrates an old  right parietal stroke.  He has also had an MRI of the brain performed  earlier today.  I reviewed the study.  To my eye, there is no definite acute  findings.  He old right parietal stroke is noted.  MRA demonstrates good  flow through the large mediastinal vessels intracranially.  The right  vertebral artery is vestigial, but the left is widely patent.   IMPRESSION:  Acute brain stem stroke with resulting diplopia and gait  ataxia, possibly a mild right hemiataxia.  His MRI is negative, but that  does sometimes happen with brain stem strokes.  The history is so typical  with dysarthria and gait ataxia without vertigo, but I think the diagnosis  is pretty secure on clinical grounds.   RECOMMENDATIONS:  1. We will check stroke labs in the  morning.  2. Would also suggest checking a 2-D echocardiogram.  3.  Will need physical and occupational therapy.  4. Stroke service will follow with you.                                               Michael L. Thad Ranger, M.D.    MLR/MEDQ  D:  12/12/2003  T:  12/13/2003  Job:  [id number]   cc:   Duffy Rhody C. Andrey Campanile, M.D.  [address]  [place]  [place] [address]  Fax: [phone]   Olga Millers, M.D. [facility]

## 2010-11-15 NOTE — Op Note (Signed)
NAME:  Carlos Herman, Carlos Herman NO.:  [id number]   MEDICAL RECORD NO.:  [id number]          PATIENT TYPE:  OIB   LOCATION:  2899                         FACILITY:  [facility]   PHYSICIAN:  Tia Alert, MD     DATE OF BIRTH:  [birth year]   DATE OF PROCEDURE:  04/12/2004  DATE OF DISCHARGE:                                 OPERATIVE REPORT   PREOPERATIVE DIAGNOSIS:  Lumbar spinal stenosis with synovial cyst, L2-3,  with neurogenic claudication.   POSTOPERATIVE DIAGNOSIS:  Lumbar spinal stenosis with synovial cyst, L2-3,  with neurogenic claudication.   PROCEDURE:  Lumbar decompressive laminectomy, medial facetectomy, and  bilateral foraminotomy, L2-3, with removal of midline hemorrhagic synovial  cyst using microscopic dissection.   SURGEON:  Dr. Marikay Alar.   ASSISTANT:  Dr. Donalee Citrin.   ANESTHESIA:  General endotracheal.   COMPLICATIONS:  None.   INDICATIONS FOR PROCEDURE:  Carlos Herman is a 70 year old white male  who presented with neurogenic claudication. He had a MRI which showed severe  spinal stenosis at L2-3 with a large midline cyst felt to be a synovial  cyst. I recommended a lumbar laminectomy for decompression and for removal  of the synovial cyst. He understood the risks, benefits, and alternatives  and wished to proceed.   DESCRIPTION OF PROCEDURE:  The patient was taken to the operating room, and  after induction of adequate general endotracheal anesthesia, he was rolled  into the prone position on the Wilson frame, and all pressure points were  padded. His lumbar region was prepped with Duraprep and then draped in the  usual sterile fashion. A dorsal midline incision was made and carried down  to the lumbosacral fashion. The fascia was taken down, and the paraspinous  musculature was taken down in the subperiosteal fashion to expose the L2-3  interspace. Intraoperative x-ray confirmed that level, and then the spinous  process was removed, and  a laminectomy, bilateral medial facetectomies, and  foraminotomies were performed at L2-3. A large midline hemorrhage cyst was  found and dissected free of the dura, removed, and then sent for permanent  pathology. I was able to decompress the lateral recesses. He had quite a bit  overgrown. He had a ligament which was removed. I was able to identify the  nerve roots and feel the pedicles at L2 and L3. Once the decompression was  completed, I irrigated with copious amounts of bacitracin-containing saline  solution, dried all bleeding points, lined the dura with Gelfoam, and then  closed the fascia with interrupted #1 Vicryl, closed the subcutaneous tissue  with 2-0 Vicryl, closed the subcuticular tissue with  3-0 Vicryl, and closed the skin with Dermabond. Drapes were removed. The  patient was awakened from general anesthesia and transferred to recovery  room in stable condition at the end of the procedure. All sponge, needle,  and instrument counts were correct.       DSJ/MEDQ  D:  04/12/2004  T:  04/12/2004  Job:  [id number]

## 2010-11-15 NOTE — Cardiovascular Report (Signed)
NAME:  Carlos Herman, Carlos Herman             ACCOUNT NO.:  [id number]   MEDICAL RECORD NO.:  [id number]          PATIENT TYPE:  INP   LOCATION:  6523                         FACILITY:  [facility]   PHYSICIAN:  Salvadore Farber, M.D. LHCDATE OF BIRTH:  [birth year]   DATE OF PROCEDURE:  04/25/2004  DATE OF DISCHARGE:                              CARDIAC CATHETERIZATION   PROCEDURE:  Left heart catheterization, left ventriculography, coronary  angiography, drug-eluting stent placement x2 to the mid and distal right  coronary artery.   INDICATIONS:  Carlos Herman is a 70 year old gentleman with hypertension,  dyslipidemia, and diabetes mellitus.  He is status post coronary artery  bypass grafting in 1996.  He presented last evening with non ST segment  elevation segment myocardial infarction and was referred for diagnostic  angiography with an eye to percutaneous revascularization.   PROCEDURAL TECHNIQUE:  Informed consent was obtained.  Under 1% lidocaine  local anesthesia a 5-French sheath was placed in the right common femoral  artery using the modified Seldinger technique.  Diagnostic angiography was  performed using JL4 and JR4 catheters for the native coronaries, JR4 for  each of the vein grafts, JR4 for the left subclavian.  The LIMA was  previously demonstrated to be atretic and was not selectively engaged.  Left  heart catheterization and ventriculography was then performed using a  pigtail catheter.  These images demonstrated the culprit stenosis to be 90%  stenosis to the mid RCA.  Decision was made to proceed with percutaneous  revascularization.   300 mg of Plavix as well as double bolus eptifibatide and intravenous  heparin was given to achieve and maintain an ACT of greater than 200  seconds.  An ART3.5 guide was advanced over a wire and engaged in the ostium  of the RCA.  A Luge wire was advanced to the distal vessel without  difficulty.  The lesion in the mid RCA was predilated using  a 2.5 x 20 mm  Maverick for two successful inflations at 10 atmospheres.  It was then  stented using a 2.75 x 32 mm Taxus at 16 atmospheres.  The entirety of the  stent was then post dilated using a 3.0 x 18 mm PowerSail at 16 atmospheres.  There remained approximately a 20% residual stenosis in the mid stent at the  site of the original lesion.  This was then treated with a 3.0 x 20 mm  Quantum at 28 atmospheres.  Even with this high pressure inflation, the  lesion failed to yield completely.  Final angiography demonstrated  approximately 10% residual stenosis.   I had initially planned not to treat an area of severe disease distal to the  stented region due to small size of the runoff vessels.  However, after  stenting of the more proximal lesion the runoff vessels dilated  substantially.  I therefore decided to treat the lesion of the distal RCA.  I initially tried to cross using a 2.75 x 28 mm Taxus.  However, this would  not pass through the mid portion of the previously placed stent.  Guide and  wire were then removed  and replaced with a 6-French AL1 guide and a wiggle  wire.  The wiggle wire was advanced to the distal vessel without difficulty.  With this new system the stent passed readily.  The 2.75 x 28 mm Taxus was  deployed at 14 atmospheres.  The entirety of this stent was post dilated  using a 2.5 x 20 mm Quantum for two successive inflations at 16 atmospheres.  Final angiography demonstrated no residual stenosis and TIMI 3 flow to the  distal vasculature.  The patient tolerated the procedure well and was  transferred to the holding room in stable condition.   COMPLICATIONS:  None.   FINDINGS:  1.  LV 150/7/11.  EF 65% without regional wall motion abnormality.  2.  No aortic stenosis or mitral regurgitation.  3.  Left main:  Angiographically normal.  4.  LAD:  The LAD is a large vessel giving rise to two diagonals.  It is      occluded after the takeoff of the very small  first diagonal.  This first      diagonal has an 80% proximal stenosis.  The LIMA to the mid LAD is      atretic.  The saphenous vein graft to the large second diagonal remains      widely patent and fills the LAD in a retrograde fashion.  The LAD has      50% stenosis just downstream from the takeoff of this second diagonal.      There is then a 60% stenosis at the insertion site of the LIMA and a 90%      stenosis at the apex.  These are all unchanged from previous.  5.  Circumflex:  Large vessel giving rise to three obtuse marginals.  There      is a 60% stenosis of the proximal vessel limiting flow to a small first      obtuse marginal.  The second and third obtuse marginals are occluded at      their ostia.  There is a widely patent saphenous vein graft to these      marginals with excellent distal runoff.  6.  RCA:  Moderate sized, dominant vessel.  There is a 60% stenosis      proximally.  There is a patent stent in the proximal vessel.  There was      a 90% stenosis in the mid vessel and an 80% stenosis in the distal      vessel.  The PDA is relatively small so the PLV is at least moderate      size.  The saphenous vein graft to the PDA is occluded chronically.  The      lesions of the mid and distal RCA were successfully treated with drug-      eluting stents.   IMPRESSION/RECOMMENDATIONS:  Successful drug-eluting stent placement in the  native right coronary artery.  Excellent angiographic result.  Will plan on  Plavix indefinitely given the lengthy stented segments and severe disease.  Would also continue with aspirin indefinitely.       WED/MEDQ  D:  04/25/2004  T:  04/26/2004  Job:  [id number]   cc:   Vale Haven. Andrey Campanile, M.D.  [address]  [place]  [place] [address]  Fax: [phone]   Charlton Haws, M.D.

## 2010-11-15 NOTE — Procedures (Signed)
[facility]  Patient:    Carlos Herman, Carlos Herman Visit Number: [id number] MRN: [id number]          Service Type: PMG Location: [facility] Attending Physician:  Thyra Breed Proc. Date: 02/18/01 Adm. Date:  02/17/2001   CC:         Theron Arista C. Eden Emms, M.D. [facility]  Candy Sledge, M.D.   Procedure Report  PROCEDURE:  Right lumbar sympathetic block.  DIAGNOSIS:  Right lower extremity pain.  HISTORY OF PRESENT ILLNESS:  Carlos Herman is a 70 year old gentleman who is referred to [place] by Dr. Eden Emms for evaluation and consideration for a lumbar sympathetic block for right lower extremity pain. The patient relates a history of leg pain which dates back to September of 2000 when he initially saw his primary care physician, Dr. Andrey Campanile, who advised him that he probably had some back problems. He was sent to Dr. Ronnell Guadalajara who obtained x-rays and advised him he may have some back problems but was concerned about his right knee. He wanted to get an MRI of his right knee but in the interim, the patient had a significant stroke which required hospitalization in November 2000. He was treated by Dr. Noreene Filbert at that time. Following this, he had an arthroscopy of his knee and had some abnormal cartilage taken out. He was not pleased because he had a pain which radiated from the anterior aspect of the right calf to the dorsum of the right foot along the medial aspect. He saw Dr. Eulah Pont for reevaluation and we advised him that he probably needed a total knee replacement, injected his right knee which has not helped greatly and then his right ankle because of a sense that he was having problems because of abnormal gait as a result of the knee problem. He had persistent pain over the medial aspect of the foot and the anterior calf which he described as a squeezing type discomfort made worse by walking, improved mildly by Celebrex which occasionally takes on a burning type  quality. He works on [employer], [employer] and notes that through the course of the day he tends to have increasing discomfort the more he walks on his leg. He has also noted that curiously with sexual intercourse as he progresses to climax, he has a sharp increase in his discomfort and with ejaculation his pain seems to crescendo into a severe elevation. He notes that he has numbness and tingling in the distribution of his pain but denies bowel or bladder incontinence or weakness. He has been treated with ibuprofen and vioxx and was started on Neurontin but developed dizziness and this was stopped.  He sees Dr. Eden Emms for coronary artery disease which has required angioplasties and eventually a coronary artery bypass grafting. The grafts were taken from the right saphenous.  Following his cerebral vascular event, the patient had a seizure disorder and was treated with Dilantin which did not affect the pain in his right leg to any significant extent. He apparently has been extensively worked up by Dr. Noreene Filbert with nerve conduction studies the results of which are not available to [place]. He tells me that he has had MRIs of his back but we are unable to find access to these either.  The patient describes no color changes over his right lower extremity, no excessive sweating, no temperature differences between the right and lower extremities and no new hair growth, or nail changes.  CURRENT MEDICATIONS:  Altace, Celebrex, Imdur, Plavix which was stopped two weeks  ago, aspirin, and glucosamine.  ALLERGIES:  PENICILLIN.  FAMILY HISTORY:  Positive for strokes, diabetes, hypertension, peptic ulcer disease, and COPD in the form of emphysema.  PAST SURGICAL HISTORY:  Significant for repeated stents and angioplasties, umbilical surgery repair and CABG with median sternotomy.  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  SOCIAL HISTORY:  The patients a nonsmoker, nondrinker. He continues to work.  REVIEW OF SYSTEMS:  GENERAL:  Negative. HEAD:  Significant for glasses. NOSE/MOUTH/THROAT:  Negative. EARS:  Negative. PULMONARY: Remote history of recurrent pneumonia but  none in the past 10 years. CARDIOVASCULAR:  History of coronary artery disease as mentioned previously. GI: Negative. GU: Significant for renal calculi. HEPATIC:  Negative. MUSCULOSKELETAL:  History of osteoarthritis. NEUROLOGIC:  See HPI. ENDOCRINE:  History of diabetes, no thyroid disease. HEMATOLOGIC:  Negative except for being on Plavix which was stopped 10 days ago. CUTANEOUS:  Negative. PSYCHIATRIC:  History of depression. ALLERGY/IMMUNOLOGIC:  Negative.  PHYSICAL EXAMINATION:  VITAL SIGNS:  Blood pressure is 139/63, heart rate 77, respiratory rate 18, O2 saturations 97%, pain level is 7/10.  GENERAL:  This is a pleasant male in no acute distress.  HEENT:  Head was normocephalic, atraumatic. Eyes, extraocular movements intact with conjunctivae and sclerae clear. Nose patent nares. Oropharynx was free of lesions.  NECK:  Demonstrated mild restriction in range of motion with carotids 2+ and symmetric without bruits. There was some crepitus on range of motion. There was no lymphadenopathy.  LUNGS:  Clear.  HEART:  Regular rate and rhythm.  ABDOMINAL/GENITALIA/RECTAL:  Not performed.  BACK:  Revealed no tenderness to percussion over the vertebra with negative straight leg raise signs.  EXTREMITIES:  The patient demonstrated bony enlargement of the PIPs, DIPs and first carpal and metacarpal joints of the hands as well as the first MTPs of the feet and metatarsal joints. He had some crepitus of his right knee on  range of motion. Dorsalis pedis pulses  and radial pulses were 2+ and symmetric.  NEUROLOGIC:  The patient was oriented to person, place, time and reason for visit. Cranial nerves II-XII are grossly intact. Deep tendon reflexes were hypoactive in the upper and lower extremities. Motor was 5/5 with symmetric bulk and tone. Sensory was significant for attenuated pinprick along the medial aspect of the right foot dorsum and over the anterior aspect of the right knee. Coordination was grossly intact. There was no difference in temperature between the feet nor nail changes. He has a well healed surgical scar over his saphenous vein with decreased pinprick in that region in addition.  IMPRESSION: 1. Right lower extremity pain with differential including the possibility    of lumbar radiculopathy versus possible saphenous neuropathy versus    possible complex regional pain syndrome versus possible central pain    syndrome related to his previous stroke event. There is also the remote    possibility that he has a localized diabetic neuropathy syndrome. 2. Multiple other medical problems per other physicians which include    coronary artery disease, hypertension, borderline diabetes, history of    cerebral vascular event, osteoarthritis, history of seizures, depression,    renal calculi.  DISPOSITION:  I discussed with the patient his presentation and the fact that I had some of his previous workup but not all of it. I advised  him that performing a lumbar sympathetic block could be a diagnostic procedure as well as therapeutic procedure and it might help to define whether he has a sympathetic component to his pain presentation and discuss the potential benefits, limitations and risk of proceeding with this procedure. As an alternative, I offered to try an IV infusional lidocaine or bretylium bier block. After discussion the options, he is willing to proceed with a lumbar sympathetic block. In the interim, I have advised him that I  need to get Dr. Joya San records. If he does not respond to a sympathetic block, he may respond to medical management with amitriptyline as a first line drug before proceeding with anticonvulsants although he did not respond to Dilantin. Other options would be to ascertain whether he has significant back pathology which could be resulting in lumbar radiculopathy. This might respond to an epidural steroid injection. For the time being, he is to remain off of his Plavix.  DESCRIPTION OF PROCEDURE:  After informed consent was obtained, the patient was taken to the fluoroscopy suite where an IV was established in the right upper extremity. Monitors were placed and he was placed in the prone position with a pillow under his abdomen. Using fluoroscopic guidance, I identified lumbar vertebrae L1 through L5. The spinous process of L2 was identified and 7 cm lateral to this a skin mark was made. The skin was prepped with Betadine x 3 and draped. Using a 25 gauge needle, I anesthetized the skin and subcutaneous structures with 2 cc of 1% lidocaine. A 25 gauge spinal needle was introduced to anesthetize deeper structures with an additional 1.5 cc of 1% lidocaine. A 22 gauge Chiba needle was introduced to the anterior lateral aspect of L3 by AP, lateral and oblique projections. Of note, the patient did have significant osteophytosis about the disk interspaces. The 20 ml of 1% chloroprocaine was injected in 1 ml increments with negative aspirates between each increment. This was followed by 0.25% bupivacaine in 1 ml increments for a total volume of 20 ml. Aspirates were negative between each increment. The 3 cc of air was flushed through the needle.  CONDITION POST PROCEDURE:  The patient was stable after the procedure. After sitting up and cleansing his back of Betadine, he was observed for 15 minutes. He developed increased vasodilation of the right lower extremity and increased warmth. His pain did  not significantly improve suggesting that he does not have a sympathetic mediated component to his presentation.  DISPOSITION: 1. Resume previous diet. 2. Limitations in activities per instruction sheet. 3. Continue on current medications. 4. Followup with me next week at which time will consider either IV    infusional Lidocaine versus epidural steroid injections depending on    what further laboratory investigations from Dr. Joya San office show. Attending Physician:  Thyra Breed DD:  02/18/01 TD:  02/19/01 Job: [id number]

## 2010-11-15 NOTE — H&P (Signed)
[place]. [facility]  Patient:    Carlos Herman, Carlos Herman Visit Number: [id number] MRN: [id number]          Service Type: MED Location: [phone] Attending Physician:  Junious Silk Dictated by:   Madolyn Frieze. Jens Som, M.D. [facility] Admit Date:  12/18/2001                           History and Physical  HISTORY OF PRESENT ILLNESS:  The patient is a 70 year old male with a past medical history of coronary artery disease, status post coronary bypassing graft in 1996 as well as PCI of his right coronary artery in 1999, hypertension, hyperlipidemia, diet-controlled diabetes mellitus, CVA, who presents with chest pain and back pain.  The patient typically has exertional chest pain, dyspnea on exertion, orthopnea, PND, pedal edema, palpitations, presyncope or syncope.  This past week, he has had two brief episodes of chest pain.  This morning at approximately 4 oclock, he awoke with substernal chest pain as well as back pain.  There was no nausea, diaphoresis or shortness of breath.  The pain was not pleuritic.  Of note, he states he has had back pain with his previous coronary disease.  Also of note, he has had back pain intermittently for the past six months and has been seeing a [facility].  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  His back pain does improve with lying in certain positions. His chest pain improved with nitroglycerin on arrival.  PAST MEDICAL HISTORY:  His past medical history is significant for coronary artery disease as outlined.  He does have a history of diet-controlled diabetes mellitus.  He has hypertension as well as hyperlipidemia.  He has been intolerant to statins in the past.  He has a history of CVA.  He has had an umbilical hernia repair.  ALLERGIES/INTOLERANCES:  STATINS.  SOCIAL HISTORY:  He occasionally smokes cigars and rarely consume alcohol.  FAMILY HISTORY:   His family history is negative for coronary artery disease, although his mother has had diabetes mellitus.  REVIEW OF SYSTEMS:  He denies any headaches or fever or chills.  There is no productive cough or hemoptysis.  There is no dysphagia, odynophagia, melena or hematochezia.  There is no dysuria or hematuria.  There is no rash or seizure activity.  There is no orthopnea, PND or pedal edema.  The remaining systems are negative.  He has had pain in his lower extremities.    PHYSICAL EXAMINATION:  VITAL SIGNS:  His physical exam shows a blood pressure of 201/104 on arrival. His pulse is 80 and he is afebrile.  His respiratory rate is 14.  He is 97% on room air.  GENERAL:  He is well-developed and well-nourished, in no acute distress.  SKIN:  Warm and dry.  HEENT:  Unremarkable with normal eyelids.  NECK:  His neck is supple with a normal upstroke bilaterally and there are no bruits noted.  There is no jugular venous distention and no thyromegaly noted.  CHEST:  His chest is clear to auscultation with normal expansion.  CARDIOVASCULAR:  Exam reveals a regular rate and rhythm with a normal S1 and S2.  There are no murmurs, rubs, or gallops noted.  ABDOMEN:  Not tender or distended.  Positive bowel sounds.  No hepatosplenomegaly and no masses appreciated.  There is no abdominal bruit. He has 2+ femoral  pulses bilaterally and no bruits.  EXTREMITIES:  His extremities show no edema and I can palpate no cords.  He has 2+ dorsalis pedis pulses bilaterally.  NEUROLOGIC:  Exam is grossly intact.  LABORATORY AND ACCESSORY DATA:  His chest x-ray shows no acute disease.  His electrocardiogram shows normal sinus rhythm at a rate of 73.  The axis is normal.  There are no ST changes.  His white blood cell count is 7.9 with a hemoglobin of 15 and hematocrit of 44.  His platelet count is 270,000.  His sodium is 138 with a potassium of 4. His CO2 is 28 and his chloride is 102.  His BUN  and creatinine are 13 and 0.9. His glucose is 226.  His enzymes are normal x1.  DIAGNOSES: 1. Atypical chest and back pain. 2. History of coronary artery disease, status post coronary artery bypassing    graft. 3. Hypertension. 4. Hyperlipidemia. 5. History of intolerance to statins. 6. Diabetes, controlled with diet. 7. History of cerebrovascular accident.  PLAN:  The patient presents with chest and back pain.  The etiology is not clear to me.  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  If they are negative, then we will plan to risk-stratify with an adenosine Cardiolite tomorrow.  If he shows significant ischemia, then we will proceed with cardiac catheterization.  Of note, we will continue with his aspirin and he has been placed on heparin and nitroglycerin in the emergency room.  We will add beta blockade and continue his ACE inhibitor.  We will adjust his medications to control his blood pressure.  We will also check a D-dimer to rule out pulmonary embolus, although I doubt this is the case.  Ideally, he would benefit from a statin, but apparently he has been intolerant to these previously secondary to myalgias; we will need to discuss this with Dr. Noralyn Pick. Nishan.  We will make further recommendations once we have his enzymes. Dictated by:   Madolyn Frieze. Jens Som, M.D. [facility] Attending Physician:  Junious Silk DD:  12/18/01 TD:  12/20/01 Job: [id number]

## 2010-11-15 NOTE — Discharge Summary (Signed)
[place]. [facility]  Patient:    Carlos Herman, Carlos Herman                    MRN: [id number] Adm. Date:  [id number] Disc. Date: 04/24/00 Attending:  Colon Branch Dictator:   Annett Fabian, P.A.-C. CC:         Delsa Grana. Andrey Campanile, M.D.  Noralyn Pick. Eden Emms, M.D. [facility]   Discharge Summary  DISCHARGE DIAGNOSES: 1. Chest pain. 2. Coronary artery disease, status post coronary artery bypass grafting    in 1999. 3. Cerebrovascular accident. 4. Dizziness.  HOSPITAL COURSE:  The patient was admitted on April 22, 2000, by  Dr. Noralyn Pick. Nishan, complaining of chest pain and dizziness.  In anticipation of a potential cardiac catheterization, the patients Coumadin was held.  On April 23, 2000, the patient underwent an adenosine Cardiolite stress test.  The test revealed septal hypokinesis, no ischemia, with an ejection fraction of 46%.  Because of the patients complaint of dizziness, a neurologic consultation was obtained.  They obtained an electroencephalogram which revealed minimal periodic slowing in the left parasagittal region. There was no epileptiform activity.  They recommended a slow taper of the Dilantin.  FOLLOWUP:  The patient is to follow up with Dr. Eden Emms in three months.  LABORATORY DATA:  Sodium 138, potassium 3.9, chloride 104, CO2 of 26, BUN 9, creatinine 0.8, glucose 220.  White count 7.6, hemoglobin 13.4, hematocrit 37.8, platelets 303.  Cardiac enzymes are negative x 3.  On admission the Dilantin was 7.7, TSH 1.820.  Carotid Dopplers were obtained which revealed no ICA stenosis.  An MRI of the brain revealed no acute process.  This was unchanged from a previous study.  Electrocardiogram:  Sinus rhythm without ectopy, and no ischemic changes.  DISPOSITION:  The patient is discharged home in stable condition. DD:  04/24/00 TD:  04/24/00 Job: [id number]

## 2010-11-15 NOTE — H&P (Signed)
NAME:  Carlos Herman, Carlos Herman                       ACCOUNT NO.:  [id number]   MEDICAL RECORD NO.:  [id number]                   PATIENT TYPE:  EMS   LOCATION:  [facility]                                 FACILITY:  [facility]   PHYSICIAN:  Elliot Cousin, M.D.                 DATE OF BIRTH:  Dec 26, 1940   DATE OF ADMISSION:  12/12/2003  DATE OF DISCHARGE:                                HISTORY & PHYSICAL   CHIEF COMPLAINT:  Dizziness and feeling off-balance.   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  The patient states  that he started feeling kind of funny last night after he took his  Norvasc.  The patient does have a little lightheadedness when taking the  Norvasc, therefore, he takes it at night and the sensation wears off during  the following morning.  However, last night, the patient was wobbly.  The  sensation lasted for a few minutes and then he went to bed.  When he got up  this morning, he was able to get up to walk to the bathroom, however, he  almost fell a few times.  The patient felt very off-balanced and dizzy.  He  denies any spinning dizziness or vertiginous symptoms.  The patient denies  any associated double vision or blindness, however, he does state that his  vision appears to be more blurred.  He also has associated pressure in his  ethmoid sinuses but he denies any purulent drainage or any other upper  respiratory infection symptoms.  He has not had any fever or chills or cough  or nasal drainage.  He denies any focal weakness.  He denies any numbness or  heaviness of his arms or legs.  He denies any tingling sensation.  He denies  any slurred speech or difficulty  swallowing.  He denies having any type of  headache over the past 24 hours.  He has not had any chest pain or shortness  of breath.  He did have an episode of nausea with the dizziness but no  vomiting.  He denies any ringing in his ears or any pain in his ears.  He  denies any recent diarrhea or poor p.o. intake.   When the EMS arrived to the patient's home, his blood pressure was 194/98,  pulse 80, respiratory rate 18.  The patient was actually able to ambulate to  the vehicle, however, he was leaning to the left according to the EMT.  When  the patient arrived to the emergency department, his temperature was 97.4,  blood pressure 152/62, pulse 69, respiratory rate 20, and oxygen saturation  was 96% on room air.  The CT scan of the head revealed no acute disease but  it did reveal that the patient had evidence of a left old CVA.  The patient  will be admitted for further evaluation and management.   PAST MEDICAL HISTORY:  1. Acute infarction of the posteromedial aspect of the left parietal lobe in     June of 2000.     a. The patient received extensive rehabilitation.  The right-sided        hemiparesis was resolved.  2. Chronic residual right foot pain which the patient states is secondary to     the stroke.  The patient takes chronic oxycodone and has a history of     lumbar sympathetic block in the past.  3. Status post CABG in 1996.     a. Cardiac catheterization in March of 2004 revealed severe native three-        vessel coronary artery disease but the grafts were patent.  Ejection        fraction estimated at 71%.  4. Hypertension.  5. Type 2 diabetes mellitus.  The type 2 diabetes mellitus was diagnosed in     2000.  6. Hyperlipidemia.  7. Status post umbilical hernia repair in the past.   MEDICATIONS:  1. Plavix 75 mg daily.  2. Aspirin 81 mg daily.  3. Altace 10 mg daily.  4. Toprol-XL 50 mg daily.  5. Norvasc 5 mg daily.  6. Avandia 4 mg daily.  7. Zetia 10 mg  daily.  8. Oxycodone 5 mg 4 times daily.  9. Isosorbide 60 mg daily.   ALLERGIES:  The patient has an allergy to PENICILLIN.   SOCIAL HISTORY:  The patient is divorced.  [place]  He has 3 grown children.  He is employed by [employer].  He denies alcohol and drug use.  He occasionally smokes a cigar.  He can read and write.   FAMILY HISTORY:  The patient's father died at the age of 23 years of age  secondary to emphysema.  His mother died at 26 years of age secondary to  multiple strokes.   REVIEW OF SYSTEMS:  Review of systems as above in the history of present  illness.  In addition, the patient's review of systems is positive for  chronic right foot pain.  Positive for chronic swelling in his right leg.  Otherwise, his review of systems is negative.   PHYSICAL EXAM:  VITAL SIGNS:  Temperature 97.4, blood pressure 152/62, pulse  69, respiratory rate 20, oxygen saturation 96% on room air.  GENERAL:  The patient is a large-framed, overweight 70 year old Caucasian  man who is currently lying in bed in no acute distress.  HEENT:  Head is normocephalic and non-traumatic.  Pupils are equal, round  and reactive to light.  Extraocular movements are intact.  Conjunctivae are  clear.  Sclerae are white.  No proptosis or ptosis noted.  Tympanic  membranes are clear bilaterally.  Nasal mucosa is moist.  Oropharynx reveals  moist mucous membranes, no posterior exudates or erythema.  His teeth are in  fair repair.  NECK:  Neck is obese, supple; no adenopathy, no thyromegaly, no bruit, no  JVD.  LUNGS:  Lungs are clear to auscultation bilaterally.  HEART:  S1 and S2 with a systolic ejection murmur.  CHEST WALL:  The patient has a well-healed sternotomy scar.  ABDOMEN:  His abdomen is obese,  positive bowel sounds, soft, nontender, and  nondistended.  No hepatosplenomegaly and no masses palpated. RECTAL AND GU:  Deferred.  EXTREMITIES:  The  patient has a well-healed right lower extremity scar.  He  has a trace of pretibial edema of the right leg.  No pretibial edema of the  left leg.  Pedal pulses are 2+ bilaterally.  The patient is able to move all  of his extremities without any limitation.  No acute joint abnormalities  noted.  NEUROLOGIC:  The patient is alert and oriented x3.  Cranial nerves II-XII  are intact.  The patient has no facial droop, dysarthria or dysphagia on  exam.  Strength in the supine position is 5/5 of the upper extremities and  4+ to 5-/5 of the right lower extremity and 5/5 of the left lower extremity.  Cerebellar with finger-to-nose bilaterally was within normal limits.  Plantar reflexes on the left were downgoing; plantar reflexes on the right  were upgoing.  The patient was able to stand with some assistance; however,  he was unable to ambulate because he fell backward.  The patient was held up  by the examining physician and his son.   ADMISSION LABORATORIES:  EKG:  Normal sinus rhythm with occasional PVCs and  a heart rate of 60.   CT scan of the head without contrast revealed an old left parietal infarct.   Chest x-ray revealed cardiomegaly, evidence of history of a CABG.  No  infiltrate and no edema.   Initial cardiac markers were negative.  WBC 6.7, hemoglobin 12.6, hematocrit  26.9, MCV 92.8, platelets 211,000.  Sodium 139, potassium 3.5, chloride 108,  CO2 23, glucose 172, BUN 11, creatinine 0.9, calcium 8.2, total protein 5.5,  albumin 3.2, AST 19, ALT 17, alkaline phosphatase 59, total bilirubin 0.8.  PT 12.3, INR 0.9, PTT 21.  Urinalysis negative.   ASSESSMENT:  1. Disequilibrium/dizziness.  The etiology is unclear but will consider     transient ischemic attack, acute stroke and vertigo.  We will also     consider orthostatic changes.  We will also consider medication,     particularly the antihypertensive medications.  The patient does have a     history of an acute left brain  stroke in June of 2000.  He had extensive     rehabilitation.  He stated that the right-sided weakness had resolved.     The patient also mentions that he had a seizure associated with his last     stroke but has had no seizures since 2000.  The patient was treated with     Dilantin for approximately 1 year by his former neurologist, Dr. Candy Sledge.  2. Chronic right foot pain.  The patient states the chronic foot pain     stemmed from his stroke in 2000.  He is currently being treated with     chronic oxycodone.  3. Coronary artery disease with a history of a coronary artery bypass graft     in 1996.  The patient has had no recent complaints of chest pain or     shortness of breath.  4. Type 2 diabetes mellitus.  The patient takes only Avandia for his     diabetes.  He states that his diabetes is under good control.  He was     diagnosed in 2000.   PLAN: 1. The patient will be admitted to telemetry, preferably the stroke floor.  2. We will check  a MRI of the brain, with and without contrast, MRA of the     brain without contrast, and MRA of the neck.  3. We will check a 2-D echocardiogram and carotid Dopplers.  4. We will check a fasting lipid panel and homocysteine level in the a.m.  5. We will check a hemoglobin A1c and TSH.  6. We will continue the patient's home medications.  7. We will consult physical therapy and occupational therapy.  8. We will consult neurology.                                                Elliot Cousin, M.D.    DF/MEDQ  D:  12/12/2003  T:  12/12/2003  Job:  [id number]

## 2010-11-15 NOTE — Discharge Summary (Signed)
[place]. [facility]  Patient:    Carlos Herman, Carlos Herman                   MRN: [id number] Adm. Date:  05/04/99 Disc. Date: 05/10/99 Dictator:   Candy Sledge, M.D. CC:         [place]. Andrey Campanile, M.D.                           Discharge Summary  HISTORY OF PRESENT ILLNESS:  Briefly, [name] is a right handed divorced male, 70 years old, who began complaining of numbness in his feet, right more so than left, several weeks prior to admission.  Within a week he was experiencing  pain and numbness behind his right knee, saw his primary care physician and was  referred for venous Doppler studies, which were negative.  The symptoms persisted and at some point he began experiencing weakness in his distal right leg.  He was referred to Dr. Francena Hanly.  An MRI of the lumbar spine was planned but during the day prior to admission the patient developed increased weakness in his right leg which would not support him.  He presented to the emergency room.  MRI of the lumbar spine in the emergency room showed some foraminal stenosis on the right t L2-3 and L3-4.  There was mild canal stenosis.  PHYSICAL EXAMINATION:  Pertinent findings on admission examination included a mild decrease in muscle bulk in the right thigh.  Straight leg raising was negative t 90 degrees bilaterally.  There was no spinal tenderness.  NEUROLOGIC:  On neurologic examination, speech was normal and cranial nerve examination was unremarkable.  Motor testing revealed a negative rabarre. There was normal strength in both upper extremities and left lower extremity.  In the  right lower extremity, there was 3-/5 weakness in the hip flexors, 4/5 weakness in the quadriceps, 4/5 weakness in the thigh adductors and abductors, 3-/5 weakness in the hamstrings, and 2-3/5 weakness in the foot dorsiflexors, invertors and evertors.  There was 3/5 weakness in the foot plantar flexors.   Deep tendon reflexes were trace in the upper extremities, 1+ at the right knee and 2+ at the left knee, and 1+ at the right ankle and 2+ at the left ankle.  Plantar responses were upgoing on the right and downgoing on the left. Sensory examination revealed intact pinprick sensation throughout.  Temperature sensation was normal and vibratory sensation was slightly diminished distally.  There was no evidence of  sensory level.  IMPRESSION:  The patient was admitted with the impression of a right leg subacute monoplegia involving multiple nerve root and peripheral nerve distributions with evidence of upper motor neuron irritation.  The plan was to admit the patient primarily to rule out a thoracic spinal cord compression or mass.  LABORATORY DATA:  Admission CBC and Differential showed a hemoglobin of 14.2, hematocrit 38.9, WBC 11.6 with 88% neutrophils, 8% lymphocytes, 3% monocytes, platelet count 288,000.  Sedimentation Rate was 31 mms per hour.  Prothrombin time was 13.1 and the INR was 1.0.  Comprehensive metabolic panel showed elevated glucose of 366 mg/dl, SGOT of 57 U/l, and albumin 3.4 g/l; all other indices were within normal limits.  Hemoglobin A1C was elevated at 9.5%.  Urinalysis showed greater than 1000 mg/dl of glucose and 15 mg/dl ketones.  MRI of the lumbar spine obtained on May 04, 1999 was described above.  MRI of the  thoracic spine with and without contrast showed a small disk protrusion at 8-9 central and to the left with mild left-sided cord flattening; no other significant abnormalities noted.  MRI of the brain obtained on May 06, 1999 showed an acute infarction of the posterior and medial aspects of the left parietal lobe,  appearing to be in the distribution of the anterior cerebral artery on the left. MR angiography showed a high-grade stenosis of the left posterior cerebral artery. The right vertebral artery was felt to be either quite  hypoplastic or occluded.  2-D echocardiogram showed the left ventricle to be mildly dilated.  There was moderate concentric left ventricular hypertrophy and the ejection fraction was mildly reduced at 45 to 55%.  HOSPITAL COURSE:  The patient was admitted to the neuroscience unit.  He underwent the above studies without complications.  An initial surprise finding was an elevated serum glucose and the patient was started on an ADA diet.  He also underwent diabetic teaching.  Regular CBGs in the hospital fluctuated between 107 and 220 without other particular treatment.  Blood pressures were generally in he range of 150 to 160/80 to 90.  During the last couple of days of hospitalization, blood pressures were in a more acceptable range.  After noting an unremarkable MRI of the thoracic spine, the origin of the patients right leg pain continued to be somewhat puzzling.  Limited electromyography of he right lower extremity showed no evidence of active denervation and generally poor recruitment of the muscles.  The following day, May 06, 1999, the patient as noted to have some mild weakness of the right upper extremity suggesting possible cerebral origin to his weakness.  He underwent MRI of the brain as noted above,  showing an acute left brain stroke apparently in the anterior cerebral artery distribution.  On May 07, 1999 the patient underwent cerebral arteriography, the report of which is not available on the chart.  This showed a left anterior  cerebral artery cutoff and other foci of intracranial atherosclerosis.  At that  point, it was felt that the patient should probably be started on Coumadin. Rehab was consulted and agreed to take [name] for comprehensive inpatient rehabilitation.  The patient otherwise was doing fairly well and was making improvements in his physical therapy, getting around fairly well.  There was no  other progression of deficits.   Speech remained normal and he actually had very  little weakness in the right upper extremity compared with the right leg weakness. On May 10, 1999 a bed became available on Rehab and the patient was transferred.  FINAL DIAGNOSIS:  1.  Left brain stroke in the territory of the anterior cerebral artery. 2.  Intracranial atherosclerotic disease with apparent occlusion of the left     anterior cerebral artery posteriorly. 3.  Probable diabetes mellitus. 4.  History of hypertension.  DISCHARGE MEDICATIONS: 1.  Insulin sliding-scale. 2.  Imdur 30 mg p.o. q.h.s. 3.  Coumadin as directed.  DISPOSITION:  The patient is transferred to inpatient rehabilitation.  CONDITION ON DISCHARGE:  Stable.  PROGNOSIS:  Good. DD:  07/09/99 TD:  07/10/99 Job: [id number]

## 2010-11-15 NOTE — H&P (Signed)
[place]. [facility]  Patient:    Carlos Herman, Carlos Herman                    MRN: [id number] Adm. Date:  [id number] Disc. Date: [id number] Attending:  Colon Branch Dictator:   Abelino Derrick, P.A.-C., [facility]                         History and Physical  CHIEF COMPLAINT:  Dizziness.  HISTORY OF PRESENT ILLNESS:  Mr. Borquez is a 70 year old male who had bypass surgery in 1996.  He says his last stress test was in January 2000.  He has had a history of a stroke in November 2000.  He was seen by Dr. Candy Sledge then.  He presents to the emergency room today with complaints of dizziness and overall fatigue and malaise for the last week or so.  He denies any fever or chills.  He has had some vague chest discomfort also.  He has had some right foot pain since the time of his stroke, and this has been increasing over the last several days.  He is admitted now for further evaluation.  PAST MEDICAL HISTORY: 1. Remarkable for coronary artery disease, as noted. 2. Prior CVA.  He is on chronic Coumadin therapy since his stroke. 3. He has a history of L2-3 and L3-4 lumbar stenosis. 4. He has had an umbilical hernia repair in the past.  CURRENT MEDICATIONS: 1. Dilantin 400 mg q.d. 2. Altace 7.5 mg q.d. 3. Imdur 30 mg q.d. 4. Coumadin 4 mg q.d. 5. Aspirin 81 mg q.d.  ALLERGIES:  PENICILLIN.  SOCIAL HISTORY:  He lives with his nephew.  He is a nonsmoker.  FAMILY HISTORY:  Negative for coronary artery disease.  His father died in his 87s of COPD.  His mother died in her 8s of dementia.  REVIEW OF SYSTEMS:  Essentially unremarkable, except as noted above.  There is no history of GI bleeding or peptic ulcer disease, or thyroid disease.  PHYSICAL EXAMINATION:  VITAL SIGNS:  Blood pressure 138/78, pulse 83, temperature 98.8 degrees, respirations 18.  GENERAL:  He is a well-developed, well-nourished male, in no acute distress.  HEENT:  Normocephalic.   Extraocular movements intact.  Sclerae anicteric.  He does wear glasses.  NECK:  Without jugular venous distention.  No bruits.  CHEST:  Clear to auscultation and percussion.  CARDIAC:  A regular rate and rhythm, without murmur, rub, or gallop.  Normal S1, S2.  ABDOMEN:  Nontender.  No hepatosplenomegaly.  EXTREMITIES:  Without edema.  NEUROLOGIC:  Grossly intact.  He does have right lower extremity weakness.  He is awake, alert, and oriented, and cooperative.  IMPRESSION: 1. Dizziness and fatigue and chest pain, rule out progression of    coronary artery disease. 2. Rule out new cerebrovascular accident. 3. History of stroke in November 2000. 4. Chronic Coumadin therapy.  PLAN:  The patient will be admitted to telemetry.  CPK, MB, and troponin will be obtained.  He will be set up for an adenosine Cardiolite tomorrow.  We will obtain his office records.  Dr. Noralyn Pick. Eden Emms has ordered carotid artery Dopplers and an MRI.  Dr. Noreene Filbert will be asked to see the patient in consultation. DD:  04/22/00 TD:  04/22/00 Job: [id number]

## 2010-11-15 NOTE — Cardiovascular Report (Signed)
NAME:  Carlos Herman, Carlos Herman                       ACCOUNT NO.:  [id number]   MEDICAL RECORD NO.:  [id number]                   PATIENT TYPE:  OIB   LOCATION:  2856                                 FACILITY:  [facility]   PHYSICIAN:  Salvadore Farber, M.D.             DATE OF BIRTH:  [birth year]   DATE OF PROCEDURE:  01/31/2003  DATE OF DISCHARGE:  01/31/2003                              CARDIAC CATHETERIZATION   PROCEDURE:  Left heart catheterization, left ventriculography, coronary  angiography, saphenous vein graft angiography x2, left subclavian  angiography, abdominal aortography.   INDICATIONS:  Carlos Herman is a 70 year old gentleman with hypertension,  dyslipidemia, and diabetes mellitus who is status post coronary artery  bypass grafting in 1996.  Most recent catheterization was in 1999, which  demonstrated atretic LIMA to LAD, patent saphenous vein graft to a diagonal  which supplied the LAD in a retrograde fashion, occluded saphenous vein  graft to the RCA.  His native RCA was treated with stenting of the mid  vessel and balloon angioplasty of a PDA.  He has done well since that time.  He has remained active, without chest discomfort or exertional dyspnea.  Dr.  Eden Emms recently obtained dobutamine Cardiolite which suggested preserved  left ventricular systolic function with small fixed inferior defect with  mild peri-infarct ischemia.  He was, therefore, referred for diagnostic  angiography.   PROCEDURAL TECHNIQUE:  Informed consent was obtained.  Under 1% lidocaine  local anesthesia, a 6-French sheath was placed in the left femoral artery  using the modified Seldinger technique.  Diagnostic angiography was  performed using JL4 and JR4 catheters for the native coronaries, JR4  catheter for each of the vein grafts.  The vein graft to the RCA was not  engaged as it was previously shown to be occluded.  The JR4 catheter was  then used to selectively engage the left subclavian  artery.  Subclavian  angiography was performed by hand injection.  The LIMA graft was not engaged  as there was no flow evident in it on the left subclavian injection, and it  had previously been documented to be atretic.  Ventriculography was  performed using a pigtail catheter.  The pigtail was then pulled back to the  abdominal aorta and abdominal aortography performed by power injection.  The  patient tolerated the procedure well and was transferred to the holding room  in stable condition.  Sheaths are to be removed there.   COMPLICATIONS:  None.   FINDINGS:  1. LV:  168/15/26.  EF 71%, without regional wall motion abnormality.  2. No aortic stenosis or mitral regurgitation.  3. Abdominal aortography:  Normal abdominal aorta.  Single renal arteries     bilaterally.  Both are angiographically normal.  4. Left main:  Angiographically normal.  5. LAD:  The LAD is occluded after the takeoff of a small first diagonal     branch.  This diagonal has  an 80% stenosis proximally.  The vessel is     approximately 1 mm in size.  The LIMA to the LAD was previously     documented to be atretic.  Slight retrograde filling is seen today.     There is no competitive flow to suggest significant flow through the     LIMA.  The saphenous vein graft to the second diagonal branch is widely     patent.  This fills the LAD in a retrograde fashion.  There is a 50%     stenosis of the mid LAD just after the origin of the second diagonal     branch.  There is a 90% stenosis of the apical LAD.  6. Circumflex:  The circumflex is a large vessel giving rise to three obtuse     marginals.  There was a 60% stenosis of the proximal circumflex.  It is     then occluded after the takeoff of a small first obtuse marginal branch.     This marginal has a 90% stenosis in its proximal portion but is a 1.5-mm     vessel.  The sequential saphenous vein graft to OM 2 and OM 3 is widely     patent with excellent distal  runoff.  7. RCA:  The RCA is a moderate-sized dominant vessel.  There is a 60%     stenosis of the proximal vessel and a 60% stenosis of the mid vessel.     The stent in the mid vessel is widely patent.  The entirety of the vessel     is diffusely diseased.  The PDA has a 70% ostial stenosis and then is     occluded in its midportion.  The distal PDA is collateralized from the     LAD.   IMPRESSION AND RECOMMENDATIONS:  1. Severe three-vessel native coronary disease.  2. Patent saphenous vein graft to diagonal which supplies the left anterior     descending territory.  3. Atretic left internal mammary artery to left anterior descending.  4. Patent sequential saphenous vein graft to obtuse marginal 2 and obtuse     marginal 3.  5. Diffuse disease of the right coronary artery with occlusion of the mid     posterior descending artery.  The distal vessel is collateralized from     the left.   The patient has diffuse native disease with stable graft anatomy compared  with 1999.  Will, therefore, continue his medical therapy.  Blood pressure  was elevated today and markedly elevated during his dobutamine Cardiolite.  Will, therefore, add Norvasc 5 mg per day.                                               Salvadore Farber, M.D.    WED/MEDQ  D:  01/31/2003  T:  01/31/2003  Job:  [id number]   cc:   Charlton Haws, M.D.   Stanley C. Andrey Campanile, M.D.  [address]  [place]  [place] [address]  Fax: [phone]

## 2010-11-15 NOTE — Discharge Summary (Signed)
[place]  Patient:    PEACE, JOST Visit Number: [id number] MRN: [id number]          Service Type: MED Location: [phone] Attending Physician:  Junious Silk Dictated by:   Rozell Searing, P.A. Admit Date:  12/18/2001 Disc. Date: 12/20/01   CC:         Duffy Rhody C. Andrey Campanile, M.D.   Referring Physician Discharge [place]  PROCEDURES:  Adenosine Cardiolite.  REASON FOR ADMISSION:  Please refer to dictated admission note.  LABORATORY DATA:  CBC normal.  Electrolytes and renal function within normal limits.  Glucose 226 on admission - hemoglobin A1c 9.0.  D-dimer 0.34.  Liver enzymes normal.  Cardiac enzymes:  CPK/MB negative x3, troponin I 0.01 (x3). Lipid profile:  Total cholesterol 172, triglycerides 355, HDL 34, LDL 67 (cholesterol/HDL ratio 5.1).  TSH 2.18.  Urinalysis notable for glucosuria.  Admission CXR:  Stable cardiomegaly, no acute process.  HOSPITAL COURSE:  The patient was admitted for further evaluation and management of chest pain which the patient felt was similar to his 1999 presentation, at which time he underwent stenting of the RCA.  The patient is status post five-vessel CABG in 1996 and had a negative Cardiolite in October 2001.  Serial cardiac enzymes were normal.  The patient was thus referred for noninvasive diagnostic testing.  Adenosine Cardiolite performed on the morning of discharge revealed no evidence of ischemia; calculated ejection fraction 50%.  No further cardiac workup was recommended.  The patient was also initially referred for a CT scan of the chest for evaluation of chest pain with radiation to the back.  This revealed no evidence of aortic dissection or pulmonary embolus.  Additionally, a D-dimer was negative.  Laboratory data notable for dislipidemia; the patient has reported history of STATIN intolerance.  Consideration will need to be given to other medications (i.e. Zetia) or  fibrates, given the noted hypertriglyceridemia/low HDL with LDL significantly below 100.  The patient also had a significantly elevated hemoglobin A1c (9.0).  He was seen by the diabetic coordinator and arrangements will be made for him to receive outpatient nutritional counseling.  The patient may also require initiation of diabetic medications.  He is instructed to return to his primary care physician, Dr. Margrett Rud, for follow up of this new finding.  New medications this admission:  Initiation of Lopressor.  DISCHARGE MEDICATIONS: 1. Aspirin 81 mg q.d. 2. Imdur 30 mg q.d. 3. Plavix 75 mg q.d. 4. Altace 7.5 mg q.d. 5. Toprol-XL 50 mg q.d. (new). 6. Oxycodone 20 mg as previously directed. 7. Nitrostat 0.4 mg p.r.n.  INSTRUCTIONS:  FOLLOW-UP:  The patient is to follow up with Dr. Tawana Scale for diabetes mellitus evaluation and management.  He is to follow up with the nutrition/diabetes management center as instructed.  The patient will follow up with Dr. Charlton Haws as previously scheduled.  DISCHARGE DIAGNOSES: 1. Nonischemic chest pain.    a. Normal serial cardiac enzymes.    b. Nonischemic adenosine Cardiolite; ejection fraction 50%. 2. Coronary artery disease.    a. Five-vessel coronary artery bypass grafting in 1996.    b. Status post stent right coronary artery in 1999.    c. Negative Cardiolite, ejection fraction 46% in October 2001. 3. Hyperglycemia. 4. Dislipidemia.    STATIN intolerance. 5. Central obesity. 6. Status post left parietal stroke in 2000.    Short-term Coumadin. 7. Hypertension. Dictated by:   Rozell Searing, P.A. Attending Physician:  Olga Millers  Allyne Gee DD:  12/20/01 TD:  12/20/01 Job: [id number]

## 2010-11-15 NOTE — H&P (Signed)
NAME:  Carlos Herman, Carlos Herman             ACCOUNT NO.:  [id number]   MEDICAL RECORD NO.:  [id number]          PATIENT TYPE:  INP   LOCATION:  1830                         FACILITY:  [facility]   PHYSICIAN:  Arturo Morton. Riley Kill, M.D. [facility] OF BIRTH:  1940/11/11   DATE OF ADMISSION:  04/25/2004  DATE OF DISCHARGE:                                HISTORY & PHYSICAL   CHIEF COMPLAINT:  Chest pain.   HISTORY OF PRESENT ILLNESS:  The patient is a 70 year old gentleman well-  known to Dr. Charlton Haws.  The patient has had prior revascularization  surgery in 1996.  Tonight, while watching the ball game, he had an episode  of chest pain and took an aspirin and nitroglycerin, and subsequently this  pain went away; however, the pain came on a little bit later, and required  more time for relief.  It was subsequently relieved and he presented to the  emergency room.  The emergency room, his enzymes were noted to be slightly  positive, without electrocardiographic abnormalities.  Of note, the patient  had neurosurgery approximately 2 weeks ago for a spinal cyst removal and has  been lying around a little bit more than usual.  At his last catheterization  in August of 2004, he had an ejection fraction of 71%.  The LAD was occluded  after the diagonal; the diagonal had an 80% stenosis.  There was a saphenous  vein graft to the diagonal 2, which was patent and filled the LAD in a  retrograde fashion such that the left internal mammary was atretic.  There  was about a 50% stenosis in the LAD after the diagonal 2 insertion.  There  is also a 90% apical LAD stenosis.  The circumflex has a 60% proximal and is  totally occluded after the first OM.  The OM-1 has a 90% proximal stenosis.  Saphenous vein graft to the OM-2 and -3 is widely patent.  The RCA has 60%  and 60% lesions, and there is a stent that is patent.  There is diffuse  disease of the vessel and the PDA is occluded.  The LAD fills the PDA  through  collaterals.  The patient now is in the emergency room and is pain-  free.   PAST MEDICAL HISTORY:  1.  The patient has had diabetes diagnosed in 2000 at the time of his      stroke.  He is on oral agents only.  2.  The patient has had hypertension since 1996.  3.  The patient had a cerebrovascular accident in 2000 with an acute infarct      in the posteromedial aspect of the left parietal lobe.  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  4.  He has had residual right foot pain and has been under chronic pain      management; he takes narcotics on a regular basis.  5.  Dyslipidemia with a history of statin intolerance; the patient has been  on Zetia, but has been unable to take Zocor.  6.  Degenerative joint disease of the right knee resulting in pain,      requiring narcotic analgesics.  7.  Pain consult leading to MRI of the back with subsequent spinal cyst      surgery by Dr. Tia Alert on April 12, 2004.  8.  Umbilical hernia repair.   ALLERGIES:  The patient is allergic to PENICILLIN; he also has a STATIN  INTOLERANCE secondary to joint discomfort; at least 2 statins have been  tried.   SOCIAL HISTORY:  He is divorced and has 3 children.  He does not smoke on a  regular basis.   CURRENT MEDICATIONS:  Current medications include:  1.  Aggrenox 1 tablet p.o. b.i.d.  2.  Oxycodone 5 mg p.o. daily p.r.n.  The patient says he is taking up to      about 30 mg of oxycodone a day.  He was taking a higher dose.  3.  He is now on Senokot 1 tablet p.o. nightly p.r.n.  4.  Avandia 8 mg p.o. daily.  5.  Toprol-XL 25 mg daily.  6.  Imdur 60 mg daily.  7.  Zetia 10 mg daily.  8.  Norvasc 5 mg daily.  9.  Altace 10 mg p.o. daily.  10. Celebrex 200 mg daily.   FAMILY HISTORY:  Father died at 30 of emphysema and mother is 61 with  multiple CVAs.   REVIEW OF SYSTEMS:  His vision is okay.  His hearing has been okay.  He  has  no rashes.  He does use laxatives primarily because he has a lot of  constipation.  He has no other GI complaints.  He has had no significant  shortness of breath per se.   EXAMINATION:  GENERAL:  The patient is an alert and oriented gentleman in no  acute distress.  VITAL SIGNS:  The blood pressure is 126/57 with pulse 71, the respiratory  16.  He does not feel febrile; no temperature has been taken to date.  NECK:  There are no carotid bruits noted.  Neck is supple.  LUNGS:  The lungs are clear to auscultation and percussion.  CARDIAC:  The cardiac rhythm is regular.  The PMI is not displaced.  There  are normal first and second heart sounds.  There are no murmurs, rubs, or  gallops noted.  ABDOMEN:  The abdomen is soft and obese.  No hepatosplenomegaly is noted,  nor masses.  EXTREMITIES:  The extremities reveal slight edema bilaterally of the lower  extremities with 1+ pitting.  There is a recent scar noted in the lumbar  area.  NEUROLOGIC:  Gross neurologic exam is focally intact.   LABORATORY AND ACCESSORY CLINICAL DATA:  Chest x-ray done on April 10, 2004 that was a pre-spinal stenosis revealed normal heart size and  vascularity.   Sodium is 140, potassium 4.1, chloride 105, BUN 14, glucose 143, hematocrit  37.  CK-MBs were 6 and 6.1, and troponin I was 0.21 and troponin I was 0.29.   The electrocardiogram demonstrates normal sinus rhythm.  There is a slightly  prolonged Q-T and otherwise unremarkable.   IMPRESSION:  1.  Chest pain x2 with prior coronary artery bypass graft surgery and      positive troponins.  Please see history of present illness for details      of prior catheterization.  2.  Type 2 diabetes mellitus, on medical therapy.  3.  Hypertension.  4.  Prior cerebrovascular accident.  5.  Recent back surgery.   PLAN:  1.  We will admit the patient and obtain serial enzymes. 2.  I spoke with Dr. Yetta Barre by phone and he said that heparin would be  okay.      We plan a no-bolus and a low      therapeutic range at the present time.  3.  We will continue his medicines which include Aggrenox.  I will have Dr.      Eden Emms address whether consideration should be going back to Plavix.      The patient may require repeat cardiac catheterization.       TDS/MEDQ  D:  04/25/2004  T:  04/25/2004  Job:  [id number]   cc:   Vale [place]. Andrey Campanile, M.D.  [address]  [place]  [place] [address]  Fax: [phone]   Charlton Haws, M.D.   Tia Alert, MD  [address]  [place], [place] [address]  Fax: [phone]

## 2010-11-15 NOTE — H&P (Signed)
NAME:  Carlos Herman, Carlos Herman             ACCOUNT NO.:  [id number]   MEDICAL RECORD NO.:  [id number]          PATIENT TYPE:  OIB   LOCATION:  NA                           FACILITY:  [facility]   PHYSICIAN:  Charlies Constable, M.D. [facility] DATE OF BIRTH:  09-21-1940   DATE OF ADMISSION:  03/04/2005  DATE OF DISCHARGE:                                HISTORY & PHYSICAL   CHIEF COMPLAINT:  Chest pain.   HISTORY OF PRESENT ILLNESS:  Carlos Herman is a 70 year old male with known  coronary artery disease.  He had bypass surgery in 1996, and stents to the  RCA in 2005.  He was evaluated by Dr. Eden Emms for chest pain and had an  abnormal Myoview.  Cardiac catheterization in the [facility] lab revealed a 70%  stenosis in the proximal RCA.  The SVG to PDA was totaled as had been seen  on previous catheterizations.  It was recommended that he have percutaneous  coronary intervention to the RCA and he is here today for the procedure.   Carlos Herman has not had any chest pain since the catheterization.  He has  limited his exertion.  He has recovered well from the procedure and has had  no problems with his groin.  He has chronic pain issues secondary to nerve  damage to his right leg for which he is seen at the Pain Clinic.  He is on  scheduled oxycodone and Cymbalta which help his symptoms greatly.  He is  pain free at the time of exam.   PAST MEDICAL HISTORY:  1.  Status post aortocoronary bypass surgery in 1996, with LIMA to LAD, SVG      to diagonal, SVG to OM-1 and OM-3 and SVG to PDA.  2.  Status post PTCA and stent x2 to the RCA in 2005.  3.  Preserved left ventricular function with an EF of 60% at catheterization      on February 28, 2005.  4.  Non-insulin dependent diabetes mellitus.  5.  Hyperlipidemia.  6.  Hypertension.  7.  History of CVA in 2000, as well as 2005.  8.  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  9.  Degenerative joint disease of the right knee.  10. History of spinal cyst  and surgery.   PAST SURGICAL HISTORY:  1.  Cardiac catheterizations.  2.  Bypass surgery.  3.  Umbilical hernia repair.  4.  Spinal cyst surgery.   ALLERGIES:  PENICILLIN.  Has had intolerance to at least two STATINS.   SOCIAL HISTORY:  He is divorced and lives here in [place].  He works at  [employer].  He smokes an occasional cigar, but does not abuse  alcohol or drugs.   FAMILY HISTORY:  Both parents died at the age of 10, his father from  emphysema and his mother secondary to CVAs.  He has no siblings with  coronary artery disease.   REVIEW OF SYSTEMS:  Significant for right lower extremity pain as described  above.  He gets fairly good control with his current medical regimen which  we will follow while he is in the hospital.  GENITOURINARY:  He denies  hematemesis, hemoptysis or melena.  CONSTITUTIONAL:  No recent fevers or  chills.  He has had no significant pain or sequela from his catheterization.  Review of systems is otherwise negative.   CURRENT MEDICATIONS:  1.  Toprol XL 25 mg daily.  2.  Celebrex 200 mg daily.  3.  Norvasc 5 mg daily.  4.  Oxycodone 5 mg tablets two p.o. q.a.m. and at bedtime with an additional      tablet occasionally p.r.n.  5.  Isosorbide 60 mg q.p.m.  6.  Altace 10 mg daily.  7.  Zetia 10 mg daily.  8.  Avandia 8 mg daily.  9.  Aspirin 325 mg daily.  10. Plavix 75 mg daily.  11. Cymbalta 60 mg daily.  12. Sublingual nitroglycerin p.r.n.   PHYSICAL EXAMINATION:  VITAL SIGNS:  Temperature 98.0, blood pressure  134/56, heart rate 66, respirations 18, O2 saturations 98% on room air.  GENERAL:  He is a well-developed, well-nourished, white male in no acute  distress.  HEENT:  His head is normocephalic, atraumatic with pupils equal round and  reactive to light and accommodation.  Extraocular movements intact.  Sclerae  clear.  Nares without discharge.  NECK:  No JVD.  No thyromegaly.  No carotid bruits are appreciated.   LUNGS:  Clear to auscultation bilaterally.  HEART:  Regular in rate and rhythm with soft systolic murmur noted.  ABDOMEN:  Obese, soft, nontender.  EXTREMITIES:  Distal pulses are intact with no edema noted.  His  catheterization site is well-healed and no bruit is appreciated.  NEUROLOGIC:  He is alert and oriented with cranial nerves 2-12 grossly  intact.  SKIN:  No rashes or lesions are noted.  All surgical scars are well-healed.   IMPRESSION:  1.  Coronary artery disease.  Carlos Herman is here today for percutaneous      coronary intervention to the right coronary artery.  The left internal      mammary artery to the left anterior descending was atretic and other      grafts were patent on recent catheterization.  He is to continue on      aspirin and Plavix.  2.  Pain.  We will follow Carlos Herman' home pain regimen while he is in the      hospital and he should follow up with the Pain Clinic as scheduled.  3.  Hyperlipidemia.  Carlos Herman has tolerated Zetia, although he did not      tolerate Statins.  He should follow up with Dr. Eden Emms.   PLAN:  Carlos Herman is otherwise stable and he will be continued on all of  his home medications.      Theodore Demark, P.A. [facility]    ______________________________  Charlies Constable, M.D. [facility]    RB/MEDQ  D:  03/05/2005  T:  03/05/2005  Job:  [id number]   cc:   Vale [place]. Andrey Campanile, M.D.  [address]  [place]  [place] [address]  Fax: [phone]

## 2010-11-15 NOTE — Procedures (Signed)
[facility]  Patient:    Carlos Herman, Carlos Herman Visit Number: [id number] MRN: [id number]          Service Type: PMG Location: TPC Attending Physician:  Thyra Breed Proc. Date: 02/25/01 Admit Date:  02/17/2001   CC:         Theron Arista C. Eden Emms, M.D. [facility]  Candy Sledge, M.D.   Procedure Report  PROCEDURE:  IV infusion of lidocaine.  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  INTERVAL HISTORY:  The patient presents with ongoing pain, predominantly in the right lower extremity and ankle region.  He has noted no improvement following his lumbar sympathetic block and presents today for an IV infusion of lidocaine.  He actually felt as though his knee pain was worsened after the block.  He describes a tight feeling over his right ankle and calf region.  His medications are unchanged.  PHYSICAL EXAMINATION:  Blood pressure 105/44, heart rate is 76, respiratory rate 17O2 saturations 96%.  Pain level is 5/10.  His neuro and musculoskeletal exams are unchanged from last week.  DESCRIPTION OF PROCEDURE:  After informed consent was obtained, the patient was placed in a sitting position and monitored.  An IV was established in his left upper extremity.  I personally administered 250 mg of lidocaine.  The patient noted minimal improvement with his pain.  POSTPROCEDURE CONDITION:  Stable.  DISCHARGE INSTRUCTIONS: 1. Resume previous diet. 2. Limitations and activities per instruction sheet, as outlined by my    assistant today. 3. Resume previous medications, including his Plavix. 4. Follow up with me in 2-3 weeks to ascertain whether further injections    would be of any benefit.  At this point, it does not appear that he has a    sympathetically-mediated pain syndrome. Attending Physician:  Thyra Breed DD:  02/25/01 TD:  02/25/01 Job: [id number]

## 2010-11-15 NOTE — Assessment & Plan Note (Signed)
[facility]                         GASTROENTEROLOGY OFFICE NOTE   Carlos Herman, Carlos Herman                    MRN:          [id number]  DATE:06/17/2006                            DOB:          1940-07-18    REFERRING PHYSICIAN:  Quita Skye. Artis Flock, M.D.   REASON FOR CONSULTATION:  Rectal bleeding/colon cancer screening.   ASSESSMENT:  A 70 year old white man with some occasional rectal  bleeding, usually related to excessive stooling.  He sees bright red  blood on the tissue paper.  He has never had a screening colonoscopy.  He also has coronary artery disease with a history of multiple stents,  history of stroke as well.  He is on chronic Plavix.  He had a spell of  possible anginal last Friday.   PLAN:  1. Arrange followup appointment with Dr. Charlton Haws, his      cardiologist.  2. We will ask Dr. Eden Emms regarding guidance with respect to holding      Plavix prior to his colonoscopy.  I suspect he may need some      further evaluation of his coronary anatomy prior to anything based      upon his recent angina.  He was due for a 67-month followup with Dr.      Eden Emms in the spring but has not yet had it.  3. The patient should contact me and arrange an appointment to set up      his colonoscopy pending the evaluation with Dr. Eden Emms.   HISTORY:  A 70 year old white man who has alternating bowel habits, who  has been having some constipation using oxycodone for his knee pain.  Sometimes when he has multiple bowel movements, he will see bright red  blood on the tissue paper.  Friday night, approximately 5 days ago, he  had a limited spell of chest pain, perhaps 20 minutes or so.  He took  aspirin and it seemed to resolve.  He said in the past he has had  similar problems and has had to have stenting.  He has some occasional  crampy abdominal pain.  All other systems are negative.  GI review of  systems negative.   PAST MEDICAL HISTORY:  1.  Hypertension.  2. Obesity.  3. Coronary artery disease with myocardial infarction and multiple      stents as well as coronary artery bypass grafting.  Last stenting      was March 05, 2005, with bare metal stent to the right coronary      artery.  He has underlying diffuse disease as well as in some of      the other areas.  4. Nephrolithiasis with lithotripsy earlier this year.  5. Diabetes mellitus.  6. Obesity.  7. Dyslipidemia.  8. Previous stroke.  9. Osteoarthritis with knee problems.  He says he needs replacement.  10.Depression.   FAMILY HISTORY:  Mother had diabetes, no colon cancer.   MEDICATIONS:  1. Toprol XL 25 mg daily.  2. Norvasc 5 mg daily.  3. Oxycodone 5 mg daily.  4. Isosorbide 60 mg daily.  5. Altace 10 mg  daily.  6. Zetia 10 mg daily.  7. Baby aspirin 81 mg daily.  8. Plavix 75 mg daily.  9. Cymbalta 60 mg daily.  10.Metformin 1000 mg b.i.d.   DRUG ALLERGIES:  Penicillin.   PRN MEDICATIONS:  Nitroglycerin and Claritin.   SOCIAL HISTORY:  He is divorced.  He lives with his girlfriend.  He has  3 sons.  He is employed in [name].  No  alcohol, tobacco, or drugs.   REVIEW OF SYSTEMS:  Some fatigue, eye glasses, pedal edema, urinary  difficulty at times, chronic knee pain, urinary incontinence at times.  All other systems negative or as reflected in my medical history form.   Note, he also apparently had a Hemoccult-positive stool and was sent  over because of that as well.   Lab data, May 25, 2006:  Glucose 156 and a CMET, CBC normal.  Cholesterol 204, triglycerides 191, LDL 124, hemoglobin A1c 7.5%.  Urinalysis negative.   I appreciate the opportunity to care for this patient.     Carlos Boop, MD,FACG  Electronically Signed    CEG/MedQ  DD: 06/17/2006  DT: 06/17/2006  Job #: [id number]   cc:   Quita Skye. Artis Flock, M.D.  Noralyn Pick. Eden Emms, MD, [facility]

## 2010-11-15 NOTE — Cardiovascular Report (Signed)
NAME:  Carlos Herman, Carlos Herman NO.:  [id number]   MEDICAL RECORD NO.:  [id number]          PATIENT TYPE:  OIB   LOCATION:  2853                         FACILITY:  [facility]   PHYSICIAN:  Arvilla Meres, M.D. LHCDATE OF BIRTH:  1941/02/17   DATE OF PROCEDURE:  02/28/2005  DATE OF DISCHARGE:                              CARDIAC CATHETERIZATION   PRIMARY CARE PHYSICIAN:  Dr. Karma Ganja   CARDIOLOGIST:  Dr. Maurine Cane   PATIENT IDENTIFICATION:  Carlos Herman is a 70 year old male with a history  of coronary disease status post bypass grafting in 1996 as well as PTCA and  stenting of the RCA x2 in October of 2005 who has recently been experiencing  progressive fatigue as well as some atypical chest pain.  He was referred  for Cardiolite study which showed an EF of 52% with small infarct at the  base of the inferior wall and inferior septal region and in the apex which  was predominantly fixed.  There was a question of slight reversibility at  the lateral margin.  There was also evidence of transient ischemic  dilatation and thus he is referred for diagnostic heart catheterization.   PROCEDURES PERFORMED:  1.  Selective coronary angiography.  2.  Left heart catheterization.  3.  Left ventriculogram.  4.  Left subclavian angiography.  5.  Saphenous vein graft angiography.  6.  Abdominal aortogram.   DESCRIPTION OF PROCEDURE:  The risks, benefits of procedure were explained  to Carlos Herman.  Consent was signed and placed on the chart.  A 6-French  arterial sheath was placed in the right femoral artery using a modified  Seldinger technique.  Standard catheters were used including a JL4, JR4, and  angled pigtail.  All catheter exchanges were made over a wire.  There were  no apparent complications.   FINDINGS:  Central aortic pressure is 147/69 with a mean of 100.  LV  pressure is 163/0 with an EDP of 15.  There was no gradient on pullback  across the aortic valve.   Left main was normal.   LAD:  LAD is a large vessel giving rise to two diagonals.  It is totally  occluded after the takeoff a very small first diagonal.  There is a 95%  ostial stenosis in the first diagonal.   The LIMA to the LAD is atretic.   The saphenous vein graft to the second diagonal is widely patent.  There is  a 40% lesion in the diagonal after the insertion of a vein graft.  The vein  graft then backfills the LAD well.  In the LAD proper just after the takeoff  of the diagonal there is a 50% tubular stenosis.  There is 60% distal  stenosis at the insertion of the LIMA.   The left circumflex gives off a tiny ramus and three marginals.  There is a  60-70% lesion in the proximal circumflex.  The OM1 is totally occluded  proximally.  The OM2 is heavily diseased throughout with a 99% focal lesion.  The OM3 is totally occluded at the ostium.   The saphenous  vein sequential graft to the OM1 and the OM3 is widely patent.  In the upper branch of the OM3 just after the insertion of the vein graft  there is a 60% focal stenosis which is mildly hazy, but not flow-limiting.   The right coronary artery is a dominant vessel.  It gives off a small to  moderate PDA and a moderate sized PL.  There is a 70% proximal stenosis  prior to the first stent.  The proximal stent is widely patent.  Following  the stent there is a 40% mid lesion.  In the distal RCA prior to the take ff  a the PDA there is another stent which is also widely patent.  In the PDA  there is an 80% ostial stenosis and a 95% tubular stenosis in the mid  portion; however, this is small and diffusely diseased.   Left ventriculogram done in the RAO position showed an EF of 60% with no  mitral regurgitation or wall motion abnormalities.  Abdominal aortogram  showed patent renal arteries bilaterally with no significant aortoiliac  disease.   ASSESSMENT/PLAN:  1.  Severe native three-vessel coronary disease.  2.  Atretic  left internal mammary artery.  3.  Saphenous vein graft to the obtuse marginal 1 and obtuse marginal 3 is      widely patent.  4.  Saphenous vein graft to the diagonal is patent and fills the left      anterior descending well.  5.  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  6.  The right coronary artery stents are widely patent; however, there is a      high-grade stenosis proximal to the first right coronary artery stent.  7.  Normal left ventricular function.   DISCUSSION:  I will review the films with Dr. Juanda Chance but he will likely need  angioplasty and stenting of the proximal RCA.  Of note, the lesion in the  OM3 after the insertion of the vein graft currently does not appear flow-  limiting but I suspect this may cause him a problem in the future.      Arvilla Meres, M.D. [facility]  Electronically Signed     DB/MEDQ  D:  02/28/2005  T:  02/28/2005  Job:  [id number]   cc:   Vale Haven. Andrey Campanile, M.D.  [address]  [place]  [place] [address]  Fax: [phone]   Charlton Haws, M.D.  [address]  [address]  [place]  [place] [address]

## 2010-11-15 NOTE — Consult Note (Signed)
[place]  Patient:    Carlos Herman, Carlos Herman                    MRN: [id number] Proc. Date: 04/22/00 Adm. Date:  [id number] Attending:  Colon Branch CC:         Noralyn Pick. Eden Emms, M.D. [facility]  [facility], [address]   Consultation Report  HISTORY OF PRESENT ILLNESS:  Carlos Herman is a 70 year old right-handed white male born 1940-12-22, with a history of coronary artery disease, cerebrovascular disease.  This patient sustained a left parietal stroke in November of 2000.  He was seen previously with Dr. Noreene Filbert.  The patient was found to have bicerebral events but on the most recent MRI scan, the right brain lesion has since disappeared.  The patient has had an episode while in rehabilitation that is consistent with seizure event and tongue biting. The event was unwitnessed.  The patient was placed on Dilantin and the patient has had some symptoms of toxicity on the medication and is running levels around 7 without recurrent seizures.  The patient has had some intermittent dizziness or vertigo since November 2000.  He notes that if he moves his head too quickly, stands, stoops, rolls over, that he may a brief episode of vertigo lasting only a few seconds that improves.  The patient has no vomiting or nausea.  The patient denies any double vision, denies any problems with any new numbness, weakness, on the face, arms, legs.  Denies any swallowing problems, speech problems, hearing problems or ringing in the ears.  Neurology is asked to see this patient today for an evaluation.  Carotid Doppler studies have been done today and were unremarkable.  MRI scan of the brain shows an old left parietal hemorrhagic stroke that does not show any new changes.  PAST MEDICAL HISTORY:  Significant for: 1. History of hypertension. 2. History of diabetes. 3. History of coronary artery disease status post MI, CABG  procedure in the    past. 4. History of left parietal stroke. 5. History of lumbosacral spinal stenosis. 6. History of vertigo as above. 7. History of knee surgery in the past. 8. History of umbilical hernia repair.  MEDICATIONS AT THIS TIME: 1. Include Dilantin 400 mg a day. 2. Altace 7.5 mg daily. 3. The patient is on Coumadin 4 mg a day. 4. Imdur 30 mg q.h.s. 5. Aspirin 81 mg a day.  ALLERGIES:  The patient states she has an allergy to PENICILLIN which gives him a rash.  SOCIAL HISTORY:  The patient does not smoke.  Drinks alcohol on occasion. The patient is divorced, has 4 children, one is passed away.  He has used cigars on occasion.  The patient works for [employer] and lives intact he [place] area.  FAMILY HISTORY:  Mother has dementia, a history of stroke, diabetes.  Father died in 59 of COPD.  REVIEW OF SYSTEMS: Notable for no recent fevers, chills.  The patient denies significant headache, loss of vision.  Denies any problems with shortness of breath.  Has some chest discomfort today.  No nausea or vomiting.  Denies any blackouts.  PHYSICAL EXAMINATION:  VITAL SIGNS:  Blood pressure is 138/78, heart rate is 83, respiratory rate 18, temperature afebrile.  GENERAL:  This patient is a minimally to moderately obese white male who is alert and cooperative at the time of the examination.  HEENT:  Head is atraumatic.  Eyes:  Pupils, equal, round, reactive to light and accommodation.  Disks are flat bilaterally.  NECK:  Supple.  No carotid bruits noted.  RESPIRATORY:  Clear to auscultation and percussion.  CARDIOVASCULAR:  Reveals a regular rate and rhythm without obvious murmurs or rubs noted.  EXTREMITIES:  Without significant edema.  NEUROLOGIC:  Cranial nerves as above.  Facial symmetry is present.  The patient has good symmetry to pinprick sensation throughout the face.  Has good strength to facial muscle and to muscle head turning and shoulder  shrug bilaterally.  The patient has full visual fields.  Speech is slightly dysarthric.  End gaze nystagmus is seen bilaterally in the horizontal plane. No vertical nystagmus is seen.  The patient has good strength in all four extremities.  Good symmetric ______ noted throughout.  Patient notes hypesthesia on the right arm, right leg as compared to the left to pin prick. Vibratory sensation is depressed on the right foot as compared to the left and patient has symmetric vibratory sensation to the upper extremities.  Deep tendon reflexes were depressed but symmetric.  Toes are neutral bilaterally. The patient has good finger to nose, finger to toe to finger bilaterally. Gait is unremarkable.  Tandem gait normal.  Romberg negative.  No pronator drift is seen.  ______ maneuver was negative.  LABORATORY DATA:  Notable for a white count of 7.6, hemoglobin 13.4, hematocrit 37.8, MCV of 91.1, platelets 303, sodium 136, potassium 4.3, chloride 102, CO2 27, glucose 126, BUN 11, creatinine 0.6, calcium 8.4, total protein 6.4, albumin 3.7, AST 34, ALT 27, alk. phos. of 96, total bilirubin of 1.0, CPK of 178, troponin I less than 0.01.  Dilantin 7.7.  MRI of the brain shows notable left parietal hemorrhagic stroke, no acute changes seen otherwise.  Carotid Doppler study is negative.  IMPRESSION: 1. History of cerebrovascular disease with left parietal stroke. 2. History of vertigo possibly medication related versus benign positional    vertigo. 3. Coronary artery disease. 4. Hypertension. 5. Diabetes.  This patient is coming in for an evaluation of chest pain.  The patient has a history of probable seizure events that may have occurred around the time of the stroke.  The patient be able to taper off of the Dilantin at this point. Dilantin taper may elicit improvement in vertigo.  PLAN: 1. Check EEG study. 2. If EEG is unremarkable initiate taper of Dilantin by 100 mg every 2 weeks    until  he is off the medication.  3. Will follow patient clinically while in house.  Would not recommend any    Antivert or other medications for vertigo. DD:  04/22/00 TD:  04/23/00 Job: [id number]

## 2010-11-15 NOTE — Cardiovascular Report (Signed)
NAME:  BALDEMAR, DADY             ACCOUNT NO.:  [id number]   MEDICAL RECORD NO.:  [id number]          PATIENT TYPE:  INP   LOCATION:  6524                         FACILITY:  [facility]   PHYSICIAN:  Veverly Fells. Excell Seltzer, MD  DATE OF BIRTH:  July 23, 1940   DATE OF PROCEDURE:  08/02/2006  DATE OF DISCHARGE:  08/04/2007                            CARDIAC CATHETERIZATION   [facility] CATH NOTE   PROCEDURE:  Left heart catheterization, selective coronary angiography,  left ventricular angiography, saphenous vein graft angiography, LIMA  angiography, Angio-Seal to the right femoral artery.   INDICATIONS:  Mr. Meneely is a 70 year old gentleman with known CAD and  prior CABG.  He presented with a chest pain syndrome.  He is ruled out  for myocardial infarction.  He was referred for cardiac catheterization  in the setting of his chest pain with known CAD.   Risks and indications of procedure were reviewed with the patient.  Informed consent was obtained.  The right groin was prepped, draped,  anesthetized with 1% lidocaine, using modified Seldinger technique, a 6-  French sheath was placed in the right femoral artery.  Standard 6-French  Judkins' catheters were used for native coronary angiography.  The JR-4  catheter was used for saphenous vein graft angiography and LIMA  angiography.  A LCD catheter was used for saphenous vein graft  angiography as well.  An angled pigtail catheter was used for left  ventriculography.  A pull-back across the aortic valve was done.  At the  completion of the procedure, an Angio-Seal device was used to seal the  femoral arteriotomy.  The patient tolerated the procedure well.  There  were no immediate complications.   FINDINGS:  Aortic pressure 183/74 with a mean of 117, left ventricular  pressure 186/13.   CORONARY ANGIOGRAPHY:  Left mainstem:  The left mainstem is patent.  It  bifurcates into the LAD and left circumflex.  There is minimal stenosis  present.   LAD:  The LAD is occluded in its proximal segment, just beyond the first  diagonal branch.  First diagonal branch is a small vessel that is  severely diseased.  There is 99% stenosis in the proximal portion of  that vessel.   Left circumflex.  The left circumflex is severely diseased.  The  circumflex courses down and is dilated in its proximal portion.  There  is an 80% stenosis just beyond the ectatic segment, and the vessel was  diffusely diseased from there on, with diffuse 70% to 80% stenosis.  The  vessel courses down and supplies a branch in the third OM territory that  is diffusely diseased.  There are no higher OMs visualized.   The right coronary artery is patent.  There are multiple stents in the  proximal as well as distal vessel.  There is a long stented segment in  the distal vessel that likely represents multiple overlapping stents.  The stents were widely patent.  In the proximal vessel, there is minimal  restenosis.  The distal overlapping stents have mild restenosis no worse  than 20% to 30%.  The PDA is occluded.  There is a PL branch that is  widely patent.  The PDA is collateralized from septal perforating  branches of the LAD.   Saphenous vein graft angiography, saphenous vein graft to second  diagonal branch is widely patent.  There is minimal irregularity of the  graft in the body of the graft.  The first diagonal branch is patent.  This graft retrograde fills the entire LAD system with multiple septal  perforators.  The distal portion of the LAD has a focal non-severe  stenosis of 90% near the apex.  The perforating branches supply the  right PDA as detailed above.  The midportion of the LAD has no  significant obstructive disease.   Saphenous vein graft to right PDA, 100%.   Saphenous vein graft to left circumflex, 100%.   LIMA to LAD, atretic.   Left ventriculography demonstrates normal LV systolic function with an  LVEF of 55%.    ASSESSMENT:  1. Severe native three-vessel coronary artery disease.  2. Patent right coronary artery stents.  3. Patent saphenous vein graft to the first diagonal, which fills the      entire left anterior descending circulation.  4. Total occlusion, saphenous vein graft to the right coronary artery.  5. Total occlusion, saphenous vein graft to the obuse marginal.  6. Atretic left internal mammary artery to left anterior descending.  7. Preserved left ventricular function.   PLAN:  Mr. Fant has no targets for PCI.  He does have significant  small-vessel CAD and diffuse disease.  He should continue with  aggressive medical therapy.      Veverly Fells. Excell Seltzer, MD  Electronically Signed     MDC/MEDQ  D:  08/03/2007  T:  08/04/2007  Job:  [id number]   cc:   Noralyn Pick. Eden Emms, MD, [facility]

## 2010-11-15 NOTE — Procedures (Signed)
CLINICAL HISTORY:  A 70 year old man with history of previous stroke and  seizure, now with dizziness and unsteady gait. The electroencephalogram is  performed for evaluation of possible seizure. The patient is described as  awake and alert. This is a routine electroencephalogram done with photic but  not hyperventilation.   DESCRIPTION OF PROCEDURE:  The dominant rhythm in this tracing is a moderate  to high amplitude alpha rhythm of 10 hertz, which predominates posteriorly.  Appears without abnormal asymmetry and attenuates with eye opening and  closing. Low amplitude fast activity is seen frontally and centrally and  appears without abnormal asymmetry. No focal slowing is noted. No  epileptiform discharges are seen. The patient remained in the awake state  throughout the recording. Photic stimulation produced bilateral symmetric  driving responses. Hyperventilation was not performed. Single channel  devoted to electrocardiogram reveals sinus rhythm throughout with a rate of  approximately 90 beats per minute.   CONCLUSION:  Normal study in awake state.    Fenton Malling, M.D.   [id number]  D:  12/12/2003 20:51:19  T:  12/12/2003 21:42:36  Job #:  [id number]

## 2010-12-27 ENCOUNTER — Telehealth: Payer: Self-pay | Admitting: Cardiovascular Disease

## 2010-12-27 NOTE — Telephone Encounter (Signed)
Returning call back to Debra  

## 2010-12-27 NOTE — Telephone Encounter (Signed)
Spoke with pt, he is needing surgical clearance for left shoulder decompression. He is due for a 6 month appt. Follow up appt made Carlos Herman

## 2010-12-30 ENCOUNTER — Encounter: Payer: Self-pay | Admitting: Cardiovascular Disease

## 2010-12-31 ENCOUNTER — Encounter: Payer: Self-pay | Admitting: Cardiovascular Disease

## 2010-12-31 ENCOUNTER — Ambulatory Visit (INDEPENDENT_AMBULATORY_CARE_PROVIDER_SITE_OTHER): Payer: Medicare Other | Admitting: Cardiovascular Disease

## 2010-12-31 ENCOUNTER — Encounter: Payer: Self-pay | Admitting: Cardiology

## 2010-12-31 DIAGNOSIS — Z01810 Encounter for preprocedural cardiovascular examination: Secondary | ICD-10-CM

## 2010-12-31 DIAGNOSIS — I2510 Atherosclerotic heart disease of native coronary artery without angina pectoris: Secondary | ICD-10-CM

## 2010-12-31 DIAGNOSIS — R0989 Other specified symptoms and signs involving the circulatory and respiratory systems: Secondary | ICD-10-CM

## 2010-12-31 DIAGNOSIS — I6350 Cerebral infarction due to unspecified occlusion or stenosis of unspecified cerebral artery: Secondary | ICD-10-CM

## 2010-12-31 DIAGNOSIS — I10 Essential (primary) hypertension: Secondary | ICD-10-CM

## 2010-12-31 MED ORDER — AMIODARONE HCL 400 MG PO TABS: 400.0000 mg | ORAL_TABLET | Freq: Two times a day (BID) | ORAL | Status: DC

## 2010-12-31 MED ORDER — METOPROLOL SUCCINATE ER 100 MG PO TB24: 100.0000 mg | ORAL_TABLET | Freq: Every day | ORAL | Status: DC

## 2010-12-31 NOTE — Assessment & Plan Note (Signed)
Stable no angina.  Change beta blocker to Toprol 100 as he currently is aggravated by needing two scripts of coreg for his current dose

## 2010-12-31 NOTE — Assessment & Plan Note (Signed)
Not afib.  Change beta blocker and add amiodarone prior to surgery

## 2010-12-31 NOTE — Patient Instructions (Signed)
Your physician recommends that you schedule a follow-up appointment. Please call to schedule your follow up appointment 1-2 weeks after your surgery.  Your physician has recommended you make the following change in your medication: STOP COREG and START TOPROL 100 mg mg daily and AMIODARONE 400 mg twice daily.

## 2010-12-31 NOTE — Assessment & Plan Note (Signed)
Microvascular Continue ASA and Plavix

## 2010-12-31 NOTE — Progress Notes (Signed)
He has distant history of CABG I believe in 95. 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 His lima is atretic with the LAD filling retrograde from the SVG to the Diagonal. The native RCA is patent with multiple stents. His last cath was 08/2007. He is on chronic Plavix His myovue in 02/2009 was low risk with small inferior infarct mild periinfarct ischemia and EF 58% [name] has had a lot of orthopedic and neuro problems with his back and legs. He fractured his left ankle and has had a previous synovial cyst removed from his back. He is having increasing pain below the knee left greater than right  He now has a torn tendon in his right shoulder and needs arthroscopic surgery by Dr Valentina Gu.  He is a bit agitated today.  He got impatient with the front desk staff.  His pulse was elevated and he was having a lot of PAC;s/?MAT.  ECG read as afib but not.  Reviewed with Dr Teressa Lower to make sure.    Reviewed long rhythm strip and clearly NSR with PAC;s  ROS: Denies fever, malais, weight loss, blurry vision, decreased visual acuity, cough, sputum, SOB, hemoptysis, pleuritic pain, palpitaitons, heartburn, abdominal pain, melena, lower extremity edema, claudication, or rash.  All other systems reviewed and negative  General: Affect agitated  Healthy:  appears stated age HEENT: normal Neck supple with no adenopathy JVP normal no bruits no thyromegaly Lungs clear with no wheezing and good diaphragmatic motion Heart:  S1/S2 no murmur,rub, gallop or click PMI normal Abdomen: benighn, BS positve, no tenderness, no AAA no bruit.  No HSM or HJR Distal pulses intact with no bruits Plus one bilateral  edema Neuro non-focal Skin warm and dry Decreased ROM in left shoulder with mild  muscular weakness S/P left ankle fracture and RTKR.    Current Outpatient Prescriptions  Medication Sig Dispense Refill  . amLODipine (NORVASC) 10 MG tablet TAKE ONE TABLET BY MOUTH ONE TIME DAILY  90 tablet  0  . aspirin 81  MG tablet Take 81 mg by mouth daily.        . carvedilol (COREG) 12.5 MG tablet TAKE 1 TABLET DAILY  90 tablet  2  . isosorbide mononitrate (IMDUR) 60 MG 24 hr tablet Take 60 mg by mouth daily.        . metFORMIN (GLUCOPHAGE) 1000 MG tablet Take 1,000 mg by mouth 2 (two) times daily with a meal.        . oxyCODONE (OXYCONTIN) 15 MG TB12 Take 15 mg by mouth every 12 (twelve) hours as needed.        Marland Kitchen PLAVIX 75 MG tablet TAKE 1 TABLET DAILY  90 tablet  2  . ramipril (ALTACE) 10 MG tablet Take 10 mg by mouth daily.        Marland Kitchen DISCONTD: DULoxetine (CYMBALTA) 60 MG capsule Take 60 mg by mouth daily.          Allergies  Penicillins and Statins  Electrocardiogram:  Assessment and Plan

## 2010-12-31 NOTE — Assessment & Plan Note (Signed)
Has done well with multiple previous open orthopedic procedures.  Ok for left shoulder surgery.  Will start amiodarone to limit risk of PAF.  See post op and anticipate short course of Rx

## 2011-01-03 ENCOUNTER — Encounter: Payer: Self-pay | Admitting: Cardiovascular Disease

## 2011-01-06 ENCOUNTER — Other Ambulatory Visit: Payer: Self-pay | Admitting: Cardiovascular Disease

## 2011-01-21 ENCOUNTER — Telehealth: Payer: Self-pay | Admitting: Cardiovascular Disease

## 2011-01-21 NOTE — Telephone Encounter (Signed)
lori has question re pt meds. Pt is having surgery on January 29, 2011

## 2011-02-02 ENCOUNTER — Other Ambulatory Visit: Payer: Self-pay | Admitting: Cardiovascular Disease

## 2011-02-03 NOTE — Telephone Encounter (Signed)
Patient wants to discuss stopping one medication

## 2011-03-13 ENCOUNTER — Other Ambulatory Visit: Payer: Self-pay | Admitting: Anesthesiology

## 2011-03-13 DIAGNOSIS — M549 Dorsalgia, unspecified: Secondary | ICD-10-CM

## 2011-03-19 ENCOUNTER — Ambulatory Visit
Admission: RE | Admit: 2011-03-19 | Discharge: 2011-03-19 | Disposition: A | Discharge status: Home / Self Care | Payer: Medicare Other | Source: Ambulatory Visit | Attending: Anesthesiology | Admitting: Anesthesiology

## 2011-03-19 DIAGNOSIS — M549 Dorsalgia, unspecified: Secondary | ICD-10-CM

## 2011-03-19 MED ORDER — GADOBENATE DIMEGLUMINE 529 MG/ML IV SOLN
20.0000 mL | Freq: Once | INTRAVENOUS | Status: AC | PRN
  Administered 2011-03-19: 20 mL via INTRAVENOUS

## 2011-03-21 LAB — BASIC METABOLIC PANEL
BUN: 14
CO2: 25
CO2: 26
Calcium: 8.4
Chloride: 106
Chloride: 107
Creatinine, Ser: 0.83
GFR calc Af Amer: >60
GFR calc non Af Amer: >60
Glucose, Bld: 124 — ABNORMAL HIGH
Potassium: 3.7
Potassium: 3.9

## 2011-03-21 LAB — POCT CARDIAC MARKERS
CKMB, poc: 1.6
Myoglobin, poc: 59.1
Operator id: 277751
Operator id: 277751
Troponin i, poc: <0.05
Troponin i, poc: <0.05

## 2011-03-21 LAB — CBC
HCT: 33.8 — ABNORMAL LOW
HCT: 36.2 — ABNORMAL LOW
Hemoglobin: 11.6 — ABNORMAL LOW
Hemoglobin: 13.2
MCHC: 34.1
MCV: 90.8
MCV: 90.9
MCV: 91.4
Platelets: 242
Platelets: 259
RBC: 3.72 — ABNORMAL LOW
RBC: 3.99 — ABNORMAL LOW
RDW: 13.6
WBC: 8.1
WBC: 9.9

## 2011-03-21 LAB — I-STAT 8, (EC8 V) (CONVERTED LAB)
Bicarbonate: 27.7 — ABNORMAL HIGH
Glucose, Bld: 113 — ABNORMAL HIGH
Hemoglobin: 13.6
Operator id: 277751
Sodium: 139
TCO2: 29
pCO2, Ven: 51.1 — ABNORMAL HIGH

## 2011-03-21 LAB — DIFFERENTIAL
Eosinophils Absolute: 0.1
Eosinophils Relative: 1
Lymphocytes Relative: 28
Lymphs Abs: 2.8
Monocytes Relative: 7

## 2011-03-21 LAB — LIPID PANEL
Cholesterol: 156
HDL: 28 — ABNORMAL LOW
LDL Cholesterol: 104 — ABNORMAL HIGH
Total CHOL/HDL Ratio: 5.6

## 2011-03-21 LAB — HEMOGLOBIN A1C: Hgb A1c MFr Bld: 7.4 — ABNORMAL HIGH

## 2011-03-21 LAB — CARDIAC PANEL(CRET KIN+CKTOT+MB+TROPI)
CK, MB: 1.5
CK, MB: 1.5
Relative Index: INVALID
Troponin I: <0.01

## 2011-03-21 LAB — POCT I-STAT CREATININE: Creatinine, Ser: 0.9

## 2011-03-21 LAB — PROTIME-INR: INR: 1.1

## 2011-04-08 LAB — BASIC METABOLIC PANEL
BUN: 8
BUN: 9
CO2: 25
CO2: 27
CO2: 30
Calcium: 7.9 — ABNORMAL LOW
Calcium: 8.4
Calcium: 8.4
Calcium: 8.6
Chloride: 102
Creatinine, Ser: 0.7
Creatinine, Ser: 0.8
Creatinine, Ser: 0.87
Creatinine, Ser: 0.94
GFR calc Af Amer: >60
GFR calc Af Amer: >60
GFR calc Af Amer: >60
GFR calc non Af Amer: >60
GFR calc non Af Amer: >60
Glucose, Bld: 144 — ABNORMAL HIGH
Glucose, Bld: 153 — ABNORMAL HIGH
Sodium: 136
Sodium: 138

## 2011-04-08 LAB — URINALYSIS, ROUTINE W REFLEX MICROSCOPIC
Glucose, UA: NEGATIVE
Leukocytes, UA: NEGATIVE
Protein, ur: NEGATIVE
pH: 6

## 2011-04-08 LAB — BLOOD GAS, ARTERIAL
Bicarbonate: 25.2 — ABNORMAL HIGH
O2 Content: 2.5
TCO2: 26.2
pCO2 arterial: 35.2
pH, Arterial: 7.468 — ABNORMAL HIGH

## 2011-04-08 LAB — URINE MICROSCOPIC-ADD ON

## 2011-04-08 LAB — CBC
Hemoglobin: 9.7 — ABNORMAL LOW
Hemoglobin: 9.9 — ABNORMAL LOW
MCHC: 33.9
MCHC: 34.3
MCHC: 34.4
MCV: 91.6
Platelets: 230
RBC: 3.04 — ABNORMAL LOW
RBC: 3.13 — ABNORMAL LOW
RBC: 3.14 — ABNORMAL LOW
RDW: 12.6
RDW: 12.6
RDW: 12.6
RDW: 12.8

## 2011-04-08 LAB — CULTURE, BLOOD (ROUTINE X 2): Culture: NO GROWTH

## 2011-04-08 LAB — URINE CULTURE

## 2011-04-09 LAB — URINE CULTURE
Colony Count: NO GROWTH
Culture: NO GROWTH

## 2011-04-09 LAB — CBC
Platelets: 259
RDW: 12.9
WBC: 8.5

## 2011-04-09 LAB — URINALYSIS, ROUTINE W REFLEX MICROSCOPIC
Bilirubin Urine: NEGATIVE
Glucose, UA: NEGATIVE
Hgb urine dipstick: NEGATIVE
Nitrite: NEGATIVE
Specific Gravity, Urine: 1.021
pH: 5

## 2011-04-09 LAB — COMPREHENSIVE METABOLIC PANEL
ALT: 17
AST: 19
Albumin: 3.6
Alkaline Phosphatase: 83
Chloride: 106
Potassium: 4.4
Sodium: 138
Total Bilirubin: 0.4
Total Protein: 6

## 2011-04-09 LAB — ABO/RH: ABO/RH(D): A NEG

## 2011-04-09 LAB — DIFFERENTIAL
Basophils Relative: 2 — ABNORMAL HIGH
Eosinophils Absolute: 0.1
Eosinophils Relative: 2
Monocytes Absolute: 0.6
Monocytes Relative: 8

## 2011-04-09 LAB — CROSSMATCH
ABO/RH(D): A NEG
Antibody Screen: NEGATIVE

## 2011-04-09 LAB — APTT: aPTT: 24

## 2011-04-17 ENCOUNTER — Telehealth: Payer: Self-pay | Admitting: Cardiovascular Disease

## 2011-04-17 MED ORDER — ISOSORBIDE MONONITRATE ER 60 MG PO TB24: 60.0000 mg | ORAL_TABLET | Freq: Every day | ORAL | Status: DC

## 2011-04-17 NOTE — Telephone Encounter (Signed)
Pt returning your call

## 2011-07-10 ENCOUNTER — Other Ambulatory Visit: Payer: Self-pay | Admitting: Cardiovascular Disease

## 2011-08-11 ENCOUNTER — Other Ambulatory Visit: Payer: Self-pay | Admitting: Cardiovascular Disease

## 2011-10-01 ENCOUNTER — Telehealth: Payer: Self-pay

## 2011-10-01 NOTE — Telephone Encounter (Signed)
Refilled metoprolol 100mg 

## 2011-11-05 DIAGNOSIS — G8929 Other chronic pain: Secondary | ICD-10-CM | POA: Diagnosis not present

## 2011-11-05 DIAGNOSIS — I679 Cerebrovascular disease, unspecified: Secondary | ICD-10-CM | POA: Diagnosis not present

## 2011-11-05 DIAGNOSIS — E663 Overweight: Secondary | ICD-10-CM | POA: Diagnosis not present

## 2011-11-05 DIAGNOSIS — E119 Type 2 diabetes mellitus without complications: Secondary | ICD-10-CM | POA: Diagnosis not present

## 2011-11-05 DIAGNOSIS — I2510 Atherosclerotic heart disease of native coronary artery without angina pectoris: Secondary | ICD-10-CM | POA: Diagnosis not present

## 2011-11-05 DIAGNOSIS — I10 Essential (primary) hypertension: Secondary | ICD-10-CM | POA: Diagnosis not present

## 2011-11-05 DIAGNOSIS — Z0000 Encounter for general adult medical examination without abnormal findings: Secondary | ICD-10-CM | POA: Diagnosis not present

## 2011-11-05 DIAGNOSIS — E785 Hyperlipidemia, unspecified: Secondary | ICD-10-CM | POA: Diagnosis not present

## 2011-12-04 DIAGNOSIS — M899 Disorder of bone, unspecified: Secondary | ICD-10-CM | POA: Diagnosis not present

## 2012-02-05 ENCOUNTER — Other Ambulatory Visit: Payer: Self-pay | Admitting: Cardiovascular Disease

## 2012-03-03 ENCOUNTER — Other Ambulatory Visit: Payer: Self-pay | Admitting: Cardiovascular Disease

## 2012-04-06 DIAGNOSIS — M545 Low back pain: Secondary | ICD-10-CM | POA: Diagnosis not present

## 2012-04-06 DIAGNOSIS — M4310 Spondylolisthesis, site unspecified: Secondary | ICD-10-CM | POA: Diagnosis not present

## 2012-04-06 DIAGNOSIS — M47817 Spondylosis without myelopathy or radiculopathy, lumbosacral region: Secondary | ICD-10-CM | POA: Diagnosis not present

## 2012-04-19 ENCOUNTER — Other Ambulatory Visit: Payer: Self-pay | Admitting: Neurological Surgery

## 2012-04-19 DIAGNOSIS — M545 Low back pain: Secondary | ICD-10-CM

## 2012-04-20 ENCOUNTER — Other Ambulatory Visit: Payer: Self-pay | Admitting: Cardiovascular Disease

## 2012-04-28 ENCOUNTER — Ambulatory Visit
Admission: RE | Admit: 2012-04-28 | Discharge: 2012-04-28 | Disposition: A | Discharge status: Home / Self Care | Payer: Medicare Other | Source: Ambulatory Visit | Attending: Neurological Surgery | Admitting: Neurological Surgery

## 2012-04-28 VITALS — BP 112/49 | HR 66 | Ht 69.0 in | Wt 245.0 lb

## 2012-04-28 DIAGNOSIS — M545 Low back pain: Secondary | ICD-10-CM | POA: Diagnosis not present

## 2012-04-28 MED ORDER — DIAZEPAM 5 MG PO TABS
5.0000 mg | ORAL_TABLET | Freq: Once | ORAL | Status: AC
  Administered 2012-04-28: 5 mg via ORAL

## 2012-04-28 MED ORDER — IOHEXOL 180 MG/ML  SOLN
15.0000 mL | Freq: Once | INTRAMUSCULAR | Status: AC | PRN
  Administered 2012-04-28: 15 mL via INTRATHECAL

## 2012-04-28 NOTE — Progress Notes (Signed)
Patient states he has been off Plavix at least the past five days and the Amitriptyline for the past two days.   jkl

## 2012-05-03 ENCOUNTER — Other Ambulatory Visit: Payer: Self-pay | Admitting: Cardiovascular Disease

## 2012-05-07 DIAGNOSIS — E1129 Type 2 diabetes mellitus with other diabetic kidney complication: Secondary | ICD-10-CM | POA: Diagnosis not present

## 2012-05-07 DIAGNOSIS — Z23 Encounter for immunization: Secondary | ICD-10-CM | POA: Diagnosis not present

## 2012-05-07 DIAGNOSIS — M545 Low back pain: Secondary | ICD-10-CM | POA: Diagnosis not present

## 2012-05-07 DIAGNOSIS — I10 Essential (primary) hypertension: Secondary | ICD-10-CM | POA: Diagnosis not present

## 2012-05-07 DIAGNOSIS — M899 Disorder of bone, unspecified: Secondary | ICD-10-CM | POA: Diagnosis not present

## 2012-05-07 DIAGNOSIS — M949 Disorder of cartilage, unspecified: Secondary | ICD-10-CM | POA: Diagnosis not present

## 2012-05-07 DIAGNOSIS — N182 Chronic kidney disease, stage 2 (mild): Secondary | ICD-10-CM | POA: Diagnosis not present

## 2012-05-19 DIAGNOSIS — E291 Testicular hypofunction: Secondary | ICD-10-CM | POA: Diagnosis not present

## 2012-07-05 DIAGNOSIS — M545 Low back pain: Secondary | ICD-10-CM | POA: Diagnosis not present

## 2012-07-07 ENCOUNTER — Other Ambulatory Visit: Payer: Self-pay | Admitting: *Deleted

## 2012-07-07 MED ORDER — METOPROLOL SUCCINATE ER 100 MG PO TB24: ORAL_TABLET | ORAL | Status: DC

## 2012-07-13 ENCOUNTER — Other Ambulatory Visit: Payer: Self-pay | Admitting: *Deleted

## 2012-07-13 DIAGNOSIS — IMO0002 Reserved for concepts with insufficient information to code with codable children: Secondary | ICD-10-CM | POA: Diagnosis not present

## 2012-07-13 DIAGNOSIS — G5770 Causalgia of unspecified lower limb: Secondary | ICD-10-CM | POA: Diagnosis not present

## 2012-07-13 DIAGNOSIS — M48061 Spinal stenosis, lumbar region without neurogenic claudication: Secondary | ICD-10-CM | POA: Diagnosis not present

## 2012-07-13 MED ORDER — RAMIPRIL 10 MG PO CAPS: 10.0000 mg | ORAL_CAPSULE | Freq: Every day | ORAL | Status: DC

## 2012-07-14 ENCOUNTER — Other Ambulatory Visit: Payer: Self-pay | Admitting: *Deleted

## 2012-07-14 MED ORDER — CLOPIDOGREL BISULFATE 75 MG PO TABS: 75.0000 mg | ORAL_TABLET | Freq: Every day | ORAL | Status: DC

## 2012-07-28 ENCOUNTER — Encounter: Payer: Self-pay | Admitting: Cardiovascular Disease

## 2012-07-28 ENCOUNTER — Ambulatory Visit (INDEPENDENT_AMBULATORY_CARE_PROVIDER_SITE_OTHER): Payer: Medicare Other | Admitting: Cardiovascular Disease

## 2012-07-28 VITALS — BP 146/80 | HR 96 | Ht 69.0 in | Wt 243.0 lb

## 2012-07-28 DIAGNOSIS — I2510 Atherosclerotic heart disease of native coronary artery without angina pectoris: Secondary | ICD-10-CM

## 2012-07-28 DIAGNOSIS — I6350 Cerebral infarction due to unspecified occlusion or stenosis of unspecified cerebral artery: Secondary | ICD-10-CM | POA: Diagnosis not present

## 2012-07-28 DIAGNOSIS — Z96649 Presence of unspecified artificial hip joint: Secondary | ICD-10-CM

## 2012-07-28 DIAGNOSIS — E119 Type 2 diabetes mellitus without complications: Secondary | ICD-10-CM

## 2012-07-28 DIAGNOSIS — I25810 Atherosclerosis of coronary artery bypass graft(s) without angina pectoris: Secondary | ICD-10-CM

## 2012-07-28 DIAGNOSIS — I10 Essential (primary) hypertension: Secondary | ICD-10-CM | POA: Diagnosis not present

## 2012-07-28 MED ORDER — METOPROLOL SUCCINATE ER 100 MG PO TB24: ORAL_TABLET | ORAL | Status: DC

## 2012-07-28 NOTE — Assessment & Plan Note (Signed)
Stable with no angina and good activity level.  Continue medical Rx Increase beta blocker additional 50 mg Toprol in afternoon

## 2012-07-28 NOTE — Assessment & Plan Note (Signed)
Increase Toprol given relative tachycardia BP and CAD

## 2012-07-28 NOTE — Patient Instructions (Addendum)
Increase Toprol to 100mg  in the morning and 50mg  in the evening.  Your physician wants you to follow-up in: 6 months with Dr. Eden Emms.  You will receive a reminder letter in the mail two months in advance. If you don't receive a letter, please call our office to schedule the follow-up appointment.

## 2012-07-28 NOTE — Assessment & Plan Note (Signed)
STable continue asa and plavix

## 2012-07-28 NOTE — Assessment & Plan Note (Signed)
F/U Carlos Herman and Carlos Herman Has had right hip, knee and ankle worked on but marked change in ability to flex at hip.

## 2012-07-28 NOTE — Progress Notes (Signed)
Patient ID: Carlos Herman, male   DOB: [birth year], 72 y.o.   MRN: [id number] He has distant history of CABG I believe in 95. 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 His lima is atretic with the LAD filling retrograde from the SVG to the Diagonal. The native RCA is patent with multiple stents. His last cath was 08/2007. He is on chronic Plavix His myovue in 02/2009 was low risk with small inferior infarct mild periinfarct ischemia and EF 58% Carlos Herman has had a lot of orthopedic and neuro problems with his back and legs.  He cannot walk well or flex at the right hip. Waiting on results of EMG from Carlos Herman  ROS: Denies fever, malais, weight loss, blurry vision, decreased visual acuity, cough, sputum, SOB, hemoptysis, pleuritic pain, palpitaitons, heartburn, abdominal pain, melena, lower extremity edema, claudication, or rash.  All other systems reviewed and negative  General: Affect appropriate Overweight white male HEENT: normal Neck supple with no adenopathy JVP normal no bruits no thyromegaly Lungs clear with no wheezing and good diaphragmatic motion Heart:  S1/S2 no murmur, no rub, gallop or click PMI normal Abdomen: benighn, BS positve, no tenderness, no AAA no bruit.  No HSM or HJR Distal pulses intact with no bruits No edema Neuro non-focal Skin warm and dry Unable to ADuct at right hip   Current Outpatient Prescriptions  Medication Sig Dispense Refill  . amitriptyline (ELAVIL) 25 MG tablet Take 25 mg by mouth at bedtime.        Marland Kitchen amLODipine (NORVASC) 10 MG tablet TAKE ONE TABLET BY MOUTH ONE TIME DAILY  90 tablet  11  . aspirin 81 MG tablet Take 81 mg by mouth daily.        . Calcium Carbonate-Vitamin D (CALCIUM 600 + D PO) 1 tab po bid       . clopidogrel (PLAVIX) 75 MG tablet Take 1 tablet (75 mg total) by mouth daily.  15 tablet  0  . Flavocoxid (LIMBREL) 500 MG CAPS 1 tab po bid       . isosorbide mononitrate (IMDUR) 60 MG 24 hr tablet TAKE ONE TABLET BY MOUTH ONE TIME  DAILY  90 tablet  2  . metFORMIN (GLUCOPHAGE) 1000 MG tablet Take 1,000 mg by mouth 2 (two) times daily with a meal.        . metoprolol succinate (TOPROL-XL) 100 MG 24 hr tablet Take with or immediately following a meal.TAKE ONE TABLET BY MOUTH ONE TIME DAILY  30 tablet  3  . oxyCODONE-acetaminophen (PERCOCET) 10-325 MG per tablet Take 1 tablet by mouth every 4 (four) hours as needed.        . ramipril (ALTACE) 10 MG capsule Take 1 capsule (10 mg total) by mouth daily.  30 capsule  2    Allergies  Penicillins and Statins  Electrocardiogram:  SR rate 96 PAC's no change from 7/12  Assessment and Plan

## 2012-07-28 NOTE — Assessment & Plan Note (Signed)
Discussed low carb diet.  Target hemoglobin A1c is 6.5 or less.  Continue current medications.  

## 2012-07-29 ENCOUNTER — Telehealth: Payer: Self-pay | Admitting: Cardiovascular Disease

## 2012-07-29 MED ORDER — CLOPIDOGREL BISULFATE 75 MG PO TABS: 75.0000 mg | ORAL_TABLET | Freq: Every day | ORAL | Status: DC

## 2012-07-29 NOTE — Telephone Encounter (Signed)
Pt needs a refill on plavix asap has been trying since OV

## 2012-10-06 ENCOUNTER — Telehealth: Payer: Self-pay | Admitting: *Deleted

## 2012-10-06 MED ORDER — RAMIPRIL 10 MG PO CAPS: 10.0000 mg | ORAL_CAPSULE | Freq: Every day | ORAL | Status: DC

## 2012-10-06 MED ORDER — CLOPIDOGREL BISULFATE 75 MG PO TABS: 75.0000 mg | ORAL_TABLET | Freq: Every day | ORAL | Status: DC

## 2012-10-06 NOTE — Telephone Encounter (Signed)
PT AWARE REFILLS CALLED TO [facility]  QUANTITY  OF 90 WITH 3 REFILLS .Zack Seal

## 2012-10-06 NOTE — Telephone Encounter (Signed)
LMTCB RE   REFILLS .Zack Seal

## 2012-11-16 DIAGNOSIS — E291 Testicular hypofunction: Secondary | ICD-10-CM | POA: Diagnosis not present

## 2012-11-16 DIAGNOSIS — E785 Hyperlipidemia, unspecified: Secondary | ICD-10-CM | POA: Diagnosis not present

## 2012-11-16 DIAGNOSIS — M949 Disorder of cartilage, unspecified: Secondary | ICD-10-CM | POA: Diagnosis not present

## 2012-11-16 DIAGNOSIS — I10 Essential (primary) hypertension: Secondary | ICD-10-CM | POA: Diagnosis not present

## 2012-11-16 DIAGNOSIS — E663 Overweight: Secondary | ICD-10-CM | POA: Diagnosis not present

## 2012-11-16 DIAGNOSIS — Z0000 Encounter for general adult medical examination without abnormal findings: Secondary | ICD-10-CM | POA: Diagnosis not present

## 2012-11-16 DIAGNOSIS — F17200 Nicotine dependence, unspecified, uncomplicated: Secondary | ICD-10-CM | POA: Diagnosis not present

## 2012-11-16 DIAGNOSIS — M899 Disorder of bone, unspecified: Secondary | ICD-10-CM | POA: Diagnosis not present

## 2013-01-11 DIAGNOSIS — M1990 Unspecified osteoarthritis, unspecified site: Secondary | ICD-10-CM | POA: Diagnosis not present

## 2013-01-11 DIAGNOSIS — G5720 Lesion of femoral nerve, unspecified lower limb: Secondary | ICD-10-CM | POA: Diagnosis not present

## 2013-01-11 DIAGNOSIS — G894 Chronic pain syndrome: Secondary | ICD-10-CM | POA: Diagnosis not present

## 2013-01-14 ENCOUNTER — Other Ambulatory Visit: Payer: Self-pay | Admitting: *Deleted

## 2013-01-14 MED ORDER — ISOSORBIDE MONONITRATE ER 60 MG PO TB24: ORAL_TABLET | ORAL | Status: DC

## 2013-01-26 ENCOUNTER — Ambulatory Visit: Payer: Medicare Other | Admitting: Cardiovascular Disease

## 2013-01-27 ENCOUNTER — Encounter: Payer: Self-pay | Admitting: Cardiovascular Disease

## 2013-01-27 ENCOUNTER — Ambulatory Visit (INDEPENDENT_AMBULATORY_CARE_PROVIDER_SITE_OTHER): Payer: Medicare Other | Admitting: Cardiovascular Disease

## 2013-01-27 VITALS — BP 140/80 | HR 92 | Wt 238.0 lb

## 2013-01-27 DIAGNOSIS — I2510 Atherosclerotic heart disease of native coronary artery without angina pectoris: Secondary | ICD-10-CM

## 2013-01-27 DIAGNOSIS — I10 Essential (primary) hypertension: Secondary | ICD-10-CM | POA: Diagnosis not present

## 2013-01-27 DIAGNOSIS — E119 Type 2 diabetes mellitus without complications: Secondary | ICD-10-CM | POA: Diagnosis not present

## 2013-01-27 DIAGNOSIS — I6350 Cerebral infarction due to unspecified occlusion or stenosis of unspecified cerebral artery: Secondary | ICD-10-CM

## 2013-01-27 NOTE — Assessment & Plan Note (Signed)
Small vessel disease Continue ASA and Plavix

## 2013-01-27 NOTE — Assessment & Plan Note (Signed)
Discussed low carb diet.  Target hemoglobin A1c is 6.5 or less.  Continue current medications.  

## 2013-01-27 NOTE — Progress Notes (Signed)
Patient ID: Carlos Herman, male   DOB: 10/01/1940, 72 y.o.   MRN: [id number] He has distant history of CABG I believe in 95. 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 His lima is atretic with the LAD filling retrograde from the SVG to the Diagonal. The native RCA is patent with multiple stents. His last cath was 08/2007. He is on chronic Plavix His myovue in 02/2009 was low risk with small inferior infarct mild periinfarct ischemia and EF 58% [name] has had a lot of orthopedic and neuro problems with his back and legs. He cannot walk well or flex at the right hip. Waiting on results of EMG from [name]  ROS: Denies fever, malais, weight loss, blurry vision, decreased visual acuity, cough, sputum, SOB, hemoptysis, pleuritic pain, palpitaitons, heartburn, abdominal pain, melena, lower extremity edema, claudication, or rash.  All other systems reviewed and negative  General: Affect appropriate Chronically ill white male HEENT: normal Neck supple with no adenopathy JVP normal no bruits no thyromegaly Lungs clear with no wheezing and good diaphragmatic motion Heart:  S1/S2 no murmur, no rub, gallop or click PMI normal Abdomen: benighn, BS positve, no tenderness, no AAA no bruit.  No HSM or HJR Distal pulses intact with no bruits Plus one bilateral edema Neuro non-focal Skin warm and dry Limited ROM RLE at knee and hip rigfht TKR    Current Outpatient Prescriptions  Medication Sig Dispense Refill  . amitriptyline (ELAVIL) 25 MG tablet Take 25 mg by mouth at bedtime.        Marland Kitchen amLODipine (NORVASC) 10 MG tablet TAKE ONE TABLET BY MOUTH ONE TIME DAILY  90 tablet  11  . aspirin 81 MG tablet Take 81 mg by mouth daily.        . Calcium Carbonate-Vitamin D (CALCIUM 600 + D PO) 1 tab po bid       . clopidogrel (PLAVIX) 75 MG tablet Take 1 tablet (75 mg total) by mouth daily.  90 tablet  3  . isosorbide mononitrate (IMDUR) 60 MG 24 hr tablet TAKE ONE TABLET BY MOUTH ONE TIME DAILY  90 tablet  0   . metFORMIN (GLUCOPHAGE) 1000 MG tablet Take 1,000 mg by mouth 2 (two) times daily with a meal.        . metoprolol succinate (TOPROL-XL) 100 MG 24 hr tablet Take 100mg  QAM and 50mg  every evening.  135 tablet  3  . oxyCODONE-acetaminophen (PERCOCET) 10-325 MG per tablet Take 1 tablet by mouth every 4 (four) hours as needed.        . ramipril (ALTACE) 10 MG capsule Take 1 capsule (10 mg total) by mouth daily.  90 capsule  3   No current facility-administered medications for this visit.    Allergies  Penicillins and Statins  Electrocardiogram: 07/28/12 SR rate 96 PAC otherwise normal   Assessment and Plan

## 2013-01-27 NOTE — Assessment & Plan Note (Signed)
Stable with no angina and good activity level.  Continue medical Rx  

## 2013-01-27 NOTE — Patient Instructions (Signed)
Your physician wants you to follow-up in:  6 MONTHS WITH DR NISHAN  You will receive a reminder letter in the mail two months in advance. If you don't receive a letter, please call our office to schedule the follow-up appointment. Your physician recommends that you continue on your current medications as directed. Please refer to the Current Medication list given to you today. 

## 2013-01-27 NOTE — Assessment & Plan Note (Signed)
Well controlled.  Continue current medications and low sodium Dash type diet.    

## 2013-03-08 DIAGNOSIS — IMO0002 Reserved for concepts with insufficient information to code with codable children: Secondary | ICD-10-CM | POA: Diagnosis not present

## 2013-03-08 DIAGNOSIS — G894 Chronic pain syndrome: Secondary | ICD-10-CM | POA: Diagnosis not present

## 2013-03-08 DIAGNOSIS — M47817 Spondylosis without myelopathy or radiculopathy, lumbosacral region: Secondary | ICD-10-CM | POA: Diagnosis not present

## 2013-04-13 ENCOUNTER — Other Ambulatory Visit: Payer: Self-pay | Admitting: Cardiovascular Disease

## 2013-05-03 DIAGNOSIS — Z79899 Other long term (current) drug therapy: Secondary | ICD-10-CM | POA: Diagnosis not present

## 2013-05-03 DIAGNOSIS — M47817 Spondylosis without myelopathy or radiculopathy, lumbosacral region: Secondary | ICD-10-CM | POA: Diagnosis not present

## 2013-05-03 DIAGNOSIS — M159 Polyosteoarthritis, unspecified: Secondary | ICD-10-CM | POA: Diagnosis not present

## 2013-05-03 DIAGNOSIS — G894 Chronic pain syndrome: Secondary | ICD-10-CM | POA: Diagnosis not present

## 2013-05-06 ENCOUNTER — Other Ambulatory Visit: Payer: Self-pay | Admitting: Cardiovascular Disease

## 2013-05-10 DIAGNOSIS — E291 Testicular hypofunction: Secondary | ICD-10-CM | POA: Diagnosis not present

## 2013-05-10 DIAGNOSIS — Z23 Encounter for immunization: Secondary | ICD-10-CM | POA: Diagnosis not present

## 2013-05-10 DIAGNOSIS — M899 Disorder of bone, unspecified: Secondary | ICD-10-CM | POA: Diagnosis not present

## 2013-05-10 DIAGNOSIS — E785 Hyperlipidemia, unspecified: Secondary | ICD-10-CM | POA: Diagnosis not present

## 2013-05-10 DIAGNOSIS — I10 Essential (primary) hypertension: Secondary | ICD-10-CM | POA: Diagnosis not present

## 2013-05-10 DIAGNOSIS — E663 Overweight: Secondary | ICD-10-CM | POA: Diagnosis not present

## 2013-07-04 DIAGNOSIS — M25579 Pain in unspecified ankle and joints of unspecified foot: Secondary | ICD-10-CM | POA: Diagnosis not present

## 2013-07-04 DIAGNOSIS — G894 Chronic pain syndrome: Secondary | ICD-10-CM | POA: Diagnosis not present

## 2013-07-04 DIAGNOSIS — M25569 Pain in unspecified knee: Secondary | ICD-10-CM | POA: Diagnosis not present

## 2013-07-04 DIAGNOSIS — M47817 Spondylosis without myelopathy or radiculopathy, lumbosacral region: Secondary | ICD-10-CM | POA: Diagnosis not present

## 2013-07-12 ENCOUNTER — Other Ambulatory Visit: Payer: Self-pay | Admitting: Cardiovascular Disease

## 2013-07-26 ENCOUNTER — Ambulatory Visit (INDEPENDENT_AMBULATORY_CARE_PROVIDER_SITE_OTHER): Payer: Medicare Other | Admitting: Cardiovascular Disease

## 2013-07-26 ENCOUNTER — Encounter: Payer: Self-pay | Admitting: Cardiovascular Disease

## 2013-07-26 VITALS — BP 118/66 | HR 65 | Ht 69.0 in | Wt 228.0 lb

## 2013-07-26 DIAGNOSIS — I10 Essential (primary) hypertension: Secondary | ICD-10-CM | POA: Diagnosis not present

## 2013-07-26 DIAGNOSIS — I6350 Cerebral infarction due to unspecified occlusion or stenosis of unspecified cerebral artery: Secondary | ICD-10-CM

## 2013-07-26 DIAGNOSIS — Z96649 Presence of unspecified artificial hip joint: Secondary | ICD-10-CM

## 2013-07-26 DIAGNOSIS — E119 Type 2 diabetes mellitus without complications: Secondary | ICD-10-CM | POA: Diagnosis not present

## 2013-07-26 DIAGNOSIS — I2510 Atherosclerotic heart disease of native coronary artery without angina pectoris: Secondary | ICD-10-CM | POA: Diagnosis not present

## 2013-07-26 NOTE — Assessment & Plan Note (Signed)
Stable with no angina and good activity level.  Continue medical Rx  

## 2013-07-26 NOTE — Assessment & Plan Note (Signed)
Discussed low carb diet.  Target hemoglobin A1c is 6.5 or less.  Continue current medications.  

## 2013-07-26 NOTE — Patient Instructions (Signed)
Your physician recommends that you continue on your current medications as directed. Please refer to the Current Medication list given to you today.  Your physician wants you to follow-up in: 1 year with Dr. Nishan. You will receive a reminder letter in the mail two months in advance. If you don't receive a letter, please call our office to schedule the follow-up appointment.  

## 2013-07-26 NOTE — Assessment & Plan Note (Signed)
Minimal residual deficits  Continue antiplatlet Rx

## 2013-07-26 NOTE — Assessment & Plan Note (Signed)
Sees Dr Myles Rosenthal for chronic pain involving back hip and ankle  All from orthopedic issues.  Limited use of narcotics due to constipation

## 2013-07-26 NOTE — Assessment & Plan Note (Signed)
Well controlled.  Continue current medications and low sodium Dash type diet.    

## 2013-07-26 NOTE — Progress Notes (Signed)
Patient ID: Carlos Herman, male   DOB: Aug 19, 1940, 73 y.o.   MRN: [id number] He has distant history of CABG I believe in 95. 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 His lima is atretic with the LAD filling retrograde from the SVG to the Diagonal. The native RCA is patent with multiple stents. His last cath was 08/2007. He is on chronic Plavix His myovue in 02/2009 was low risk with small inferior infarct mild periinfarct ischemia and EF 58% [name] has had a lot of orthopedic and neuro problems with his back and legs. Sees Dr Myles Rosenthal for pain control  Still activity involved with [facility] [employer].  No chest pain Compliant with meds Two sons in [place] doing well     ROS: Denies fever, malais, weight loss, blurry vision, decreased visual acuity, cough, sputum, SOB, hemoptysis, pleuritic pain, palpitaitons, heartburn, abdominal pain, melena, lower extremity edema, claudication, or rash.  All other systems reviewed and negative  General: Affect appropriate Chronically ill white male  HEENT: normal Neck supple with no adenopathy JVP normal no bruits no thyromegaly Lungs clear with no wheezing and good diaphragmatic motion Heart:  S1/S2 no murmur, no rub, gallop or click PMI normal Abdomen: benighn, BS positve, no tenderness, no AAA no bruit.  No HSM or HJR Distal pulses intact with no bruits No edema Neuro non-focal S/P RTKR  Skin warm and dry No muscular weakness   Current Outpatient Prescriptions  Medication Sig Dispense Refill  . amitriptyline (ELAVIL) 25 MG tablet Take 25 mg by mouth at bedtime.        Marland Kitchen amLODipine (NORVASC) 10 MG tablet TAKE ONE TABLET BY MOUTH ONE TIME DAILY   90 tablet  0  . aspirin 81 MG tablet Take 81 mg by mouth daily.        . Calcium Carbonate-Vitamin D (CALCIUM 600 + D PO) 1 tab po bid       . clopidogrel (PLAVIX) 75 MG tablet Take 1 tablet (75 mg total) by mouth daily.  90 tablet  3  . HYDROcodone-acetaminophen (NORCO) 10-325 MG per tablet every 4  (four) hours as needed.       . isosorbide mononitrate (IMDUR) 60 MG 24 hr tablet TAKE ONE TABLET BY MOUTH ONE TIME DAILY   90 tablet  0  . JANUVIA 50 MG tablet Take 50 mg by mouth daily.       . metFORMIN (GLUCOPHAGE) 1000 MG tablet Take 1,000 mg by mouth 2 (two) times daily with a meal.        . metoprolol succinate (TOPROL-XL) 100 MG 24 hr tablet Take 100mg  QAM and 50mg  every evening.  135 tablet  3  . oxyCODONE-acetaminophen (PERCOCET) 10-325 MG per tablet Take 1 tablet by mouth every 4 (four) hours as needed.        . ramipril (ALTACE) 10 MG capsule Take 1 capsule (10 mg total) by mouth daily.  90 capsule  3   No current facility-administered medications for this visit.    Allergies  Penicillins and Statins  Electrocardiogram:  SR rate 37 PAC old IMI no change from ECG 07/28/12    Assessment and Plan

## 2013-08-04 ENCOUNTER — Other Ambulatory Visit: Payer: Self-pay | Admitting: Cardiovascular Disease

## 2013-08-08 ENCOUNTER — Other Ambulatory Visit: Payer: Self-pay | Admitting: Cardiovascular Disease

## 2013-08-26 ENCOUNTER — Telehealth: Payer: Self-pay | Admitting: *Deleted

## 2013-08-26 MED ORDER — CLOPIDOGREL BISULFATE 75 MG PO TABS: 75.0000 mg | ORAL_TABLET | Freq: Every day | ORAL | Status: DC

## 2013-08-26 NOTE — Telephone Encounter (Signed)
plavix reill sent to pharmacy

## 2013-08-26 NOTE — Telephone Encounter (Signed)
[facility] fax [phone]  Pt is transferring pharmacies and needs new Rx for clopidogrel sent in

## 2013-08-29 ENCOUNTER — Other Ambulatory Visit: Payer: Self-pay

## 2013-08-29 MED ORDER — CLOPIDOGREL BISULFATE 75 MG PO TABS: 75.0000 mg | ORAL_TABLET | Freq: Every day | ORAL | Status: DC

## 2013-08-31 ENCOUNTER — Telehealth: Payer: Self-pay

## 2013-08-31 DIAGNOSIS — M1990 Unspecified osteoarthritis, unspecified site: Secondary | ICD-10-CM | POA: Diagnosis not present

## 2013-08-31 DIAGNOSIS — M47817 Spondylosis without myelopathy or radiculopathy, lumbosacral region: Secondary | ICD-10-CM | POA: Diagnosis not present

## 2013-08-31 DIAGNOSIS — G5720 Lesion of femoral nerve, unspecified lower limb: Secondary | ICD-10-CM | POA: Diagnosis not present

## 2013-08-31 DIAGNOSIS — G894 Chronic pain syndrome: Secondary | ICD-10-CM | POA: Diagnosis not present

## 2013-08-31 NOTE — Telephone Encounter (Signed)
Opened in error

## 2013-09-02 ENCOUNTER — Other Ambulatory Visit: Payer: Self-pay

## 2013-09-02 MED ORDER — CLOPIDOGREL BISULFATE 75 MG PO TABS: 75.0000 mg | ORAL_TABLET | Freq: Every day | ORAL | Status: DC

## 2013-09-13 ENCOUNTER — Other Ambulatory Visit: Payer: Self-pay | Admitting: *Deleted

## 2013-09-13 MED ORDER — ISOSORBIDE MONONITRATE ER 60 MG PO TB24: ORAL_TABLET | ORAL | Status: DC

## 2013-09-13 MED ORDER — RAMIPRIL 10 MG PO CAPS: 10.0000 mg | ORAL_CAPSULE | Freq: Every day | ORAL | Status: DC

## 2013-09-14 ENCOUNTER — Other Ambulatory Visit: Payer: Self-pay

## 2013-09-14 MED ORDER — RAMIPRIL 10 MG PO CAPS: 10.0000 mg | ORAL_CAPSULE | Freq: Every day | ORAL | Status: DC

## 2013-10-31 DIAGNOSIS — G894 Chronic pain syndrome: Secondary | ICD-10-CM | POA: Diagnosis not present

## 2013-10-31 DIAGNOSIS — M159 Polyosteoarthritis, unspecified: Secondary | ICD-10-CM | POA: Diagnosis not present

## 2013-10-31 DIAGNOSIS — G5720 Lesion of femoral nerve, unspecified lower limb: Secondary | ICD-10-CM | POA: Diagnosis not present

## 2013-12-02 DIAGNOSIS — Z125 Encounter for screening for malignant neoplasm of prostate: Secondary | ICD-10-CM | POA: Diagnosis not present

## 2013-12-02 DIAGNOSIS — E785 Hyperlipidemia, unspecified: Secondary | ICD-10-CM | POA: Diagnosis not present

## 2013-12-02 DIAGNOSIS — Z1331 Encounter for screening for depression: Secondary | ICD-10-CM | POA: Diagnosis not present

## 2013-12-02 DIAGNOSIS — I10 Essential (primary) hypertension: Secondary | ICD-10-CM | POA: Diagnosis not present

## 2013-12-02 DIAGNOSIS — E291 Testicular hypofunction: Secondary | ICD-10-CM | POA: Diagnosis not present

## 2013-12-02 DIAGNOSIS — I2510 Atherosclerotic heart disease of native coronary artery without angina pectoris: Secondary | ICD-10-CM | POA: Diagnosis not present

## 2013-12-02 DIAGNOSIS — Z0000 Encounter for general adult medical examination without abnormal findings: Secondary | ICD-10-CM | POA: Diagnosis not present

## 2013-12-02 DIAGNOSIS — IMO0001 Reserved for inherently not codable concepts without codable children: Secondary | ICD-10-CM | POA: Diagnosis not present

## 2013-12-02 DIAGNOSIS — M899 Disorder of bone, unspecified: Secondary | ICD-10-CM | POA: Diagnosis not present

## 2013-12-02 DIAGNOSIS — M949 Disorder of cartilage, unspecified: Secondary | ICD-10-CM | POA: Diagnosis not present

## 2013-12-08 ENCOUNTER — Ambulatory Visit (HOSPITAL_COMMUNITY): Payer: Medicare Other | Attending: Internal Medicine | Admitting: Cardiology

## 2013-12-08 ENCOUNTER — Other Ambulatory Visit (HOSPITAL_COMMUNITY): Payer: Self-pay | Admitting: Cardiology

## 2013-12-08 DIAGNOSIS — I739 Peripheral vascular disease, unspecified: Secondary | ICD-10-CM | POA: Diagnosis not present

## 2013-12-08 DIAGNOSIS — R0989 Other specified symptoms and signs involving the circulatory and respiratory systems: Secondary | ICD-10-CM

## 2013-12-08 NOTE — Progress Notes (Signed)
Lower arterial doppler and bilateral duplex completed.

## 2014-01-06 ENCOUNTER — Encounter: Payer: Self-pay | Admitting: Vascular Surgery

## 2014-02-06 ENCOUNTER — Encounter: Payer: Self-pay | Admitting: Vascular Surgery

## 2014-02-07 ENCOUNTER — Ambulatory Visit (INDEPENDENT_AMBULATORY_CARE_PROVIDER_SITE_OTHER): Payer: Medicare Other | Admitting: Vascular Surgery

## 2014-02-07 ENCOUNTER — Encounter: Payer: Self-pay | Admitting: Vascular Surgery

## 2014-02-07 VITALS — BP 125/67 | HR 66 | Resp 14 | Ht 68.0 in | Wt 223.0 lb

## 2014-02-07 DIAGNOSIS — M25569 Pain in unspecified knee: Secondary | ICD-10-CM | POA: Diagnosis not present

## 2014-02-07 DIAGNOSIS — I2510 Atherosclerotic heart disease of native coronary artery without angina pectoris: Secondary | ICD-10-CM

## 2014-02-07 DIAGNOSIS — M25561 Pain in right knee: Secondary | ICD-10-CM

## 2014-02-07 NOTE — Progress Notes (Signed)
Subjective:     Patient ID: Carlos Herman, male   DOB: 1941-02-14, 73 y.o.   MRN: [id number]  HPI this 73 year old male was referred by Dr. Delfina Redwood for evaluation of leg circulation. Patient states that he has most of his discomfort in the right leg which has had multiple orthopedic problems including a broken hip, broken ankle, and right knee replacement. He also had saphenous vein removed from this leg for coronary artery bypass grafting in 1996 by Dr. Tharon Aquas Trigt Patient is able to ambulate about one half block. His chronic discomfort in the right leg is his limiting factor. He does have some numbness in the right foot. He has no history of nonhealing ulcers infection or gangrene.  Past Medical History  Diagnosis Date  . Depression   . Osteoarthritis   . Stroke \  . Dyslipidemia   . DM (diabetes mellitus)   . Nephrolithiasis   . Obesity     morbid  . Leg edema     right  . Hypertension   . CAD (coronary artery disease)   . Hemorrhoids     internal    History  Substance Use Topics  . Smoking status: Never Smoker   . Smokeless tobacco: Never Used  . Alcohol Use: Yes     Comment: Occasional    No family history on file.  Allergies  Allergen Reactions  . Penicillins Swelling and Other (See Comments)    redness  . Statins Other (See Comments)      muscle pain    Current outpatient prescriptions:amitriptyline (ELAVIL) 25 MG tablet, Take 25 mg by mouth at bedtime.  , Disp: , Rfl: ;  amLODipine (NORVASC) 10 MG tablet, TAKE ONE TABLET BY MOUTH ONE TIME DAILY , Disp: 90 tablet, Rfl: 2;  aspirin 81 MG tablet, Take 81 mg by mouth daily.  , Disp: , Rfl: ;  Calcium Carbonate-Vitamin D (CALCIUM 600 + D PO), 1 tab po bid , Disp: , Rfl:  clopidogrel (PLAVIX) 75 MG tablet, Take 1 tablet (75 mg total) by mouth daily., Disp: 90 tablet, Rfl: 1;  HYDROcodone-acetaminophen (NORCO) 10-325 MG per tablet, every 4 (four) hours as needed. , Disp: , Rfl: ;  isosorbide mononitrate (IMDUR) 60  MG 24 hr tablet, TAKE ONE TABLET BY MOUTH ONE TIME DAILY, Disp: 90 tablet, Rfl: 3;  JANUVIA 50 MG tablet, Take 50 mg by mouth daily. , Disp: , Rfl:  metFORMIN (GLUCOPHAGE) 1000 MG tablet, Take 1,000 mg by mouth 2 (two) times daily with a meal.  , Disp: , Rfl: ;  metoprolol succinate (TOPROL-XL) 100 MG 24 hr tablet, Take one tablet by mouth every morning and 1/2 tablet in the evening., Disp: 135 tablet, Rfl: 2;  oxyCODONE-acetaminophen (PERCOCET) 10-325 MG per tablet, Take 1 tablet by mouth every 4 (four) hours as needed.  , Disp: , Rfl:  ramipril (ALTACE) 10 MG capsule, Take 1 capsule (10 mg total) by mouth daily., Disp: 90 capsule, Rfl: 3  BP 125/67  Pulse 66  Resp 14  Ht 5\' 8"  (1.727 m)  Wt 223 lb (101.152 kg)  BMI 33.91 kg/m2  Body mass index is 33.91 kg/(m^2).           Review of Systems patient denies chest pain but does complain of some dyspnea on exertion, weakness in his arms and legs, numbness in his arms and legs, previous left brain CVA in 2000, swelling in both legs right worse than left. Other systems negative complete review of  systems    Objective:   Physical Exam BP 125/67  Pulse 66  Resp 14  Ht 5\' 8"  (1.727 m)  Wt 223 lb (101.152 kg)  BMI 33.91 kg/m2  Gen.-alert and oriented x3 in no apparent distress HEENT normal for age Lungs no rhonchi or wheezing Cardiovascular regular rhythm no murmurs carotid pulses 3+ palpable no bruits audible Abdomen soft nontender no palpable masses-obese  Musculoskeletal free of  major deformities Skin clear -no rashes Neurologic normal Lower extremities 3+ femoral pulses bilaterally. Right leg with 2-3+ dorsalis pedis pulse palpable. Left foot with one cluster cells pedis pulse palpable. Both feet adequately perfused.  Today I reviewed the outpatient vascular lab studies performed on 12/08/2013. Right leg has triphasic flow throughout with normal ABI. Left leg has monophasic flow below the popliteal level suggestive of tibial  occlusive disease. He has calcified tibial vessels bilaterally.        Assessment:     Significant right leg discomfort due to orthopedic problems primarily. 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

## 2014-04-09 ENCOUNTER — Encounter (HOSPITAL_COMMUNITY): Payer: Self-pay | Admitting: Emergency Medicine

## 2014-04-09 ENCOUNTER — Observation Stay (HOSPITAL_COMMUNITY)
Admission: EM | Admit: 2014-04-09 | Discharge: 2014-04-11 | Disposition: A | Discharge status: Home / Self Care | Payer: Medicare Other | Attending: Cardiovascular Disease | Admitting: Cardiovascular Disease

## 2014-04-09 ENCOUNTER — Emergency Department (HOSPITAL_COMMUNITY): Payer: Medicare Other

## 2014-04-09 DIAGNOSIS — I10 Essential (primary) hypertension: Secondary | ICD-10-CM | POA: Diagnosis not present

## 2014-04-09 DIAGNOSIS — R0602 Shortness of breath: Secondary | ICD-10-CM | POA: Diagnosis not present

## 2014-04-09 DIAGNOSIS — K648 Other hemorrhoids: Secondary | ICD-10-CM | POA: Diagnosis not present

## 2014-04-09 DIAGNOSIS — N200 Calculus of kidney: Secondary | ICD-10-CM | POA: Diagnosis not present

## 2014-04-09 DIAGNOSIS — Z79899 Other long term (current) drug therapy: Secondary | ICD-10-CM | POA: Diagnosis not present

## 2014-04-09 DIAGNOSIS — M1990 Unspecified osteoarthritis, unspecified site: Secondary | ICD-10-CM | POA: Diagnosis not present

## 2014-04-09 DIAGNOSIS — R079 Chest pain, unspecified: Secondary | ICD-10-CM

## 2014-04-09 DIAGNOSIS — F329 Major depressive disorder, single episode, unspecified: Secondary | ICD-10-CM | POA: Insufficient documentation

## 2014-04-09 DIAGNOSIS — I2510 Atherosclerotic heart disease of native coronary artery without angina pectoris: Secondary | ICD-10-CM | POA: Diagnosis not present

## 2014-04-09 DIAGNOSIS — Z7982 Long term (current) use of aspirin: Secondary | ICD-10-CM | POA: Diagnosis not present

## 2014-04-09 DIAGNOSIS — R072 Precordial pain: Principal | ICD-10-CM | POA: Insufficient documentation

## 2014-04-09 DIAGNOSIS — Z8673 Personal history of transient ischemic attack (TIA), and cerebral infarction without residual deficits: Secondary | ICD-10-CM | POA: Diagnosis not present

## 2014-04-09 DIAGNOSIS — E119 Type 2 diabetes mellitus without complications: Secondary | ICD-10-CM | POA: Insufficient documentation

## 2014-04-09 DIAGNOSIS — Z880 Allergy status to penicillin: Secondary | ICD-10-CM | POA: Diagnosis not present

## 2014-04-09 DIAGNOSIS — E785 Hyperlipidemia, unspecified: Secondary | ICD-10-CM | POA: Insufficient documentation

## 2014-04-09 DIAGNOSIS — R0789 Other chest pain: Secondary | ICD-10-CM | POA: Diagnosis not present

## 2014-04-09 DIAGNOSIS — I2699 Other pulmonary embolism without acute cor pulmonale: Secondary | ICD-10-CM

## 2014-04-09 LAB — D-DIMER, QUANTITATIVE: D-Dimer, Quant: 3.04 ug/mL-FEU — ABNORMAL HIGH (ref 0.00–0.48)

## 2014-04-09 LAB — BASIC METABOLIC PANEL
Anion gap: 13 (ref 5–15)
BUN: 14 mg/dL (ref 6–23)
CO2: 29 meq/L (ref 19–32)
Calcium: 9.6 mg/dL (ref 8.4–10.5)
Chloride: 97 mEq/L (ref 96–112)
Creatinine, Ser: 1.03 mg/dL (ref 0.50–1.35)
GFR calc Af Amer: 82 mL/min — ABNORMAL LOW (ref >90)
GFR, EST NON AFRICAN AMERICAN: 71 mL/min — AB (ref >90)
GLUCOSE: 192 mg/dL — AB (ref 70–99)
POTASSIUM: 4.4 meq/L (ref 3.7–5.3)
SODIUM: 139 meq/L (ref 137–147)

## 2014-04-09 LAB — GLUCOSE, CAPILLARY: Glucose-Capillary: 158 mg/dL — ABNORMAL HIGH (ref 70–99)

## 2014-04-09 LAB — CBC
HCT: 41.7 % (ref 39.0–52.0)
HEMOGLOBIN: 14.3 g/dL (ref 13.0–17.0)
MCH: 32.2 pg (ref 26.0–34.0)
MCHC: 34.3 g/dL (ref 30.0–36.0)
MCV: 93.9 fL (ref 78.0–100.0)
PLATELETS: 308 10*3/uL (ref 150–400)
RBC: 4.44 MIL/uL (ref 4.22–5.81)
RDW: 12.5 % (ref 11.5–15.5)
WBC: 10.9 10*3/uL — ABNORMAL HIGH (ref 4.0–10.5)

## 2014-04-09 LAB — I-STAT TROPONIN, ED: TROPONIN I, POC: 0 ng/mL (ref 0.00–0.08)

## 2014-04-09 LAB — TROPONIN I: Troponin I: <0.3 ng/mL (ref <0.30)

## 2014-04-09 MED ORDER — ACETAMINOPHEN 325 MG PO TABS: 650.0000 mg | ORAL_TABLET | ORAL | Status: DC | PRN

## 2014-04-09 MED ORDER — METFORMIN HCL 500 MG PO TABS
1000.0000 mg | ORAL_TABLET | Freq: Two times a day (BID) | ORAL | Status: DC
  Filled 2014-04-09: qty 2

## 2014-04-09 MED ORDER — MORPHINE SULFATE 4 MG/ML IJ SOLN
6.0000 mg | Freq: Once | INTRAMUSCULAR | Status: AC
  Administered 2014-04-09: 6 mg via INTRAVENOUS
  Filled 2014-04-09: qty 2

## 2014-04-09 MED ORDER — MAGNESIUM OXIDE 400 (241.3 MG) MG PO TABS
200.0000 mg | ORAL_TABLET | Freq: Every day | ORAL | Status: DC
  Filled 2014-04-09 (×3): qty 0.5

## 2014-04-09 MED ORDER — MAGNESIUM 250 MG PO TABS: 250.0000 mg | ORAL_TABLET | Freq: Every day | ORAL | Status: DC

## 2014-04-09 MED ORDER — METOPROLOL SUCCINATE ER 50 MG PO TB24
50.0000 mg | ORAL_TABLET | Freq: Every day | ORAL | Status: DC
  Administered 2014-04-09 – 2014-04-10 (×2): 50 mg via ORAL
  Filled 2014-04-09 (×3): qty 1

## 2014-04-09 MED ORDER — NITROGLYCERIN 2 % TD OINT
1.0000 [in_us] | TOPICAL_OINTMENT | Freq: Once | TRANSDERMAL | Status: AC
  Administered 2014-04-09: 1 [in_us] via TOPICAL
  Filled 2014-04-09: qty 1

## 2014-04-09 MED ORDER — ASPIRIN EC 81 MG PO TBEC
81.0000 mg | DELAYED_RELEASE_TABLET | Freq: Every day | ORAL | Status: DC
  Administered 2014-04-10 – 2014-04-11 (×2): 81 mg via ORAL
  Filled 2014-04-09 (×2): qty 1

## 2014-04-09 MED ORDER — METOPROLOL SUCCINATE ER 50 MG PO TB24: 50.0000 mg | ORAL_TABLET | Freq: Every day | ORAL | Status: DC

## 2014-04-09 MED ORDER — HYDROCODONE-ACETAMINOPHEN 10-325 MG PO TABS
1.0000 | ORAL_TABLET | Freq: Two times a day (BID) | ORAL | Status: DC
  Administered 2014-04-09 – 2014-04-11 (×4): 1 via ORAL
  Filled 2014-04-09 (×4): qty 1

## 2014-04-09 MED ORDER — RAMIPRIL 10 MG PO CAPS
10.0000 mg | ORAL_CAPSULE | Freq: Every day | ORAL | Status: DC
  Administered 2014-04-10 – 2014-04-11 (×2): 10 mg via ORAL
  Filled 2014-04-09 (×2): qty 1

## 2014-04-09 MED ORDER — ISOSORBIDE MONONITRATE ER 60 MG PO TB24
60.0000 mg | ORAL_TABLET | Freq: Every day | ORAL | Status: DC
  Administered 2014-04-09 – 2014-04-10 (×2): 60 mg via ORAL
  Filled 2014-04-09 (×3): qty 1

## 2014-04-09 MED ORDER — AMLODIPINE BESYLATE 10 MG PO TABS
10.0000 mg | ORAL_TABLET | Freq: Every day | ORAL | Status: DC
  Administered 2014-04-09 – 2014-04-10 (×2): 10 mg via ORAL
  Filled 2014-04-09 (×3): qty 1

## 2014-04-09 MED ORDER — GI COCKTAIL ~~LOC~~
30.0000 mL | Freq: Four times a day (QID) | ORAL | Status: DC | PRN
Start: 2014-04-09 — End: 2014-04-11

## 2014-04-09 MED ORDER — ENOXAPARIN SODIUM 40 MG/0.4ML ~~LOC~~ SOLN
40.0000 mg | SUBCUTANEOUS | Status: DC
  Filled 2014-04-09: qty 0.4

## 2014-04-09 MED ORDER — METOPROLOL SUCCINATE ER 100 MG PO TB24
100.0000 mg | ORAL_TABLET | Freq: Every day | ORAL | Status: DC
  Administered 2014-04-10 – 2014-04-11 (×2): 100 mg via ORAL
  Filled 2014-04-09 (×2): qty 1

## 2014-04-09 MED ORDER — CLOPIDOGREL BISULFATE 75 MG PO TABS: 75.0000 mg | ORAL_TABLET | Freq: Every day | ORAL | Status: DC

## 2014-04-09 MED ORDER — NITROGLYCERIN 0.4 MG SL SUBL
0.4000 mg | SUBLINGUAL_TABLET | SUBLINGUAL | Status: DC | PRN
  Administered 2014-04-09 (×2): 0.4 mg via SUBLINGUAL
  Filled 2014-04-09: qty 1

## 2014-04-09 MED ORDER — ONDANSETRON HCL 4 MG/2ML IJ SOLN
4.0000 mg | Freq: Four times a day (QID) | INTRAMUSCULAR | Status: DC | PRN
  Administered 2014-04-09: 4 mg via INTRAVENOUS
  Filled 2014-04-09: qty 2

## 2014-04-09 NOTE — ED Notes (Signed)
Attempted to call report

## 2014-04-09 NOTE — ED Provider Notes (Signed)
CSN: [id number]     Arrival date & time 04/09/14  1619 History   First MD Initiated Contact with Patient 04/09/14 1635     Chief Complaint  Patient presents with  . Chest Pain     (Consider location/radiation/quality/duration/timing/severity/associated sxs/prior Treatment) Patient is a 73 y.o. male presenting with chest pain. The history is provided by the patient.  Chest Pain Pain location:  L chest Pain quality: dull   Pain radiates to:  L arm Pain radiates to the back: no   Pain severity:  Mild Onset quality:  Gradual Duration:  3 weeks Timing:  Intermittent Progression:  Worsening (intermittentpain for 3 weeks but today he noticed it more on exertion) Chronicity:  Recurrent (states feels like heart attack) Context: at rest and stress   Relieved by:  Rest Worsened by:  Exertion Ineffective treatments:  None tried Associated symptoms: shortness of breath   Associated symptoms: no abdominal pain, no altered mental status, no cough, no dizziness, no fever, no nausea, no numbness, not vomiting and no weakness   Risk factors: coronary artery disease, diabetes mellitus, high cholesterol, hypertension, male sex and obesity   Risk factors: no smoking     Past Medical History  Diagnosis Date  . Depression   . Osteoarthritis   . Stroke \  . Dyslipidemia   . DM (diabetes mellitus)   . Nephrolithiasis   . Obesity     morbid  . Leg edema     right  . Hypertension   . CAD (coronary artery disease)   . Hemorrhoids     internal   Past Surgical History  Procedure Laterality Date  . Heart bypass  1996  . Angioplasty / stenting iliac    . Total knee arthroplasty      right   History reviewed. No pertinent family history. History  Substance Use Topics  . Smoking status: Never Smoker   . Smokeless tobacco: Never Used  . Alcohol Use: Yes     Comment: Occasional    Review of Systems  Constitutional: Negative for fever, activity change and appetite change.  HENT:  Negative for congestion and rhinorrhea.   Eyes: Negative for discharge and itching.  Respiratory: Positive for shortness of breath. Negative for cough and wheezing.   Cardiovascular: Positive for chest pain.  Gastrointestinal: Negative for nausea, vomiting, abdominal pain, diarrhea and constipation.  Genitourinary: Negative for hematuria, decreased urine volume and difficulty urinating.  Skin: Negative for rash and wound.  Neurological: Negative for dizziness, syncope, weakness and numbness.  All other systems reviewed and are negative.     Allergies  Penicillins and Statins  Home Medications   Prior to Admission medications   Medication Sig Start Date End Date Taking? Authorizing Provider  amLODipine (NORVASC) 10 MG tablet Take 10 mg by mouth at bedtime.   Yes Historical Provider, MD  aspirin EC 81 MG tablet Take 81 mg by mouth daily.   Yes Historical Provider, MD  Calcium Carbonate-Vitamin D (CALCIUM 600 + D PO) Take 1 tablet by mouth 2 (two) times daily.    Yes Historical Provider, MD  clopidogrel (PLAVIX) 75 MG tablet Take 1 tablet (75 mg total) by mouth daily. 09/02/13  Yes Josue Hector, MD  HYDROcodone-acetaminophen (NORCO) 10-325 MG per tablet Take 1 tablet by mouth 2 (two) times daily.  07/11/13  Yes Historical Provider, MD  isosorbide mononitrate (IMDUR) 60 MG 24 hr tablet Take 60 mg by mouth at bedtime.   Yes Historical Provider, MD  Magnesium 250 MG TABS Take 250 mg by mouth at bedtime.   Yes Historical Provider, MD  metFORMIN (GLUCOPHAGE) 500 MG tablet Take 1,000 mg by mouth 2 (two) times daily with a meal.   Yes Historical Provider, MD  metoprolol succinate (TOPROL-XL) 100 MG 24 hr tablet Take 50-100 mg by mouth daily. Take with or immediately following a meal: 1 tablet (100 mg) every morning and 1/2 tablet (50 mg) every night   Yes Historical Provider, MD  OVER THE COUNTER MEDICATION Take 2 capsules by mouth 2 (two) times daily. Anti-oxidant with spirulina   Yes Historical  Provider, MD  ramipril (ALTACE) 10 MG capsule Take 1 capsule (10 mg total) by mouth daily. 09/14/13  Yes Josue Hector, MD   BP 145/62  Pulse 63  Temp(Src) 97.6 F (36.4 C) (Oral)  Resp 17  Ht 5\' 4"  (1.626 m)  Wt 220 lb (99.791 kg)  BMI 37.74 kg/m2  SpO2 97% Physical Exam  Vitals reviewed. Constitutional: He is oriented to person, place, and time. He appears well-developed and well-nourished. No distress.  HENT:  Head: Normocephalic and atraumatic.  Mouth/Throat: Oropharynx is clear and moist. No oropharyngeal exudate.  Eyes: Conjunctivae and EOM are normal. Pupils are equal, round, and reactive to light. Right eye exhibits no discharge. Left eye exhibits no discharge. No scleral icterus.  Neck: Normal range of motion. Neck supple.  Cardiovascular: Normal rate, regular rhythm, normal heart sounds and intact distal pulses.  Exam reveals no gallop and no friction rub.   No murmur heard. Pulmonary/Chest: Effort normal and breath sounds normal. No respiratory distress. He has no wheezes. He has no rales.  Abdominal: Soft. He exhibits no distension and no mass. There is no tenderness.  Musculoskeletal: Normal range of motion.  Neurological: He is alert and oriented to person, place, and time. No cranial nerve deficit. He exhibits normal muscle tone. Coordination normal.  Skin: Skin is warm. No rash noted. He is not diaphoretic.    ED Course  Procedures (including critical care time) Labs Review Labs Reviewed  CBC - Abnormal; Notable for the following:    WBC 10.9 (*)    All other components within normal limits  BASIC METABOLIC PANEL - Abnormal; Notable for the following:    Glucose, Bld 192 (*)    GFR calc non Af Amer 71 (*)    GFR calc Af Amer 82 (*)    All other components within normal limits  I-STAT TROPOININ, ED    Imaging Review Dg Chest 2 View  04/09/2014   CLINICAL DATA:  Left side chest pain, upper back pain today  EXAM: CHEST  2 VIEW  COMPARISON:  04/12/2009   FINDINGS: Cardiomediastinal silhouette is stable. Mild elevation of the right hemidiaphragm. There is linear atelectasis right base. No acute infiltrate or pulmonary edema. Status post CABG. Stable degenerative changes thoracolumbar spine.  IMPRESSION: No active disease.  Status post CABG.   Electronically Signed   By: Lahoma Crocker M.D.   On: 04/09/2014 18:44     EKG Interpretation None      MDM   MDM: 73 year old white male with history of CABG with chief complaint of chest pain. Intermittent for 3 weeks worse today. Typical chest pain. Feels like previous MI. Patient hemodynamically stable having some chest pain here. Aspirin taken prior to arrival. Given nitroglycerin with some improvement. Paste applied. EKG with no new ischemic change. Troponin negative. Due to patient with strong heart history and chest pain similar to previous  MI we'll admit. Unlikely PE very typical chest pain for ACS. Chest x-ray negative for pneumonia or pneumothorax normal vascularture making dissection unlikely with current story. Admit.  Final diagnoses:  None    Admit to Cards   Sol Passer, MD 04/09/14 1939

## 2014-04-09 NOTE — H&P (Addendum)
Patient ID: Carlos Herman MRN: [id number], DOB/AGE: [birth year]   Admit date: 04/09/2014   Primary Physician: Kandice Hams, MD Primary Cardiologist: Jenkins Rouge, MD  Pt. Profile:  73M with DM, HTN, HLD, CAD s/p CABG (1996) with extensive disease of native vessels and grafts, chronic pain, who presents with chest pain and back pain.   Problem List  Past Medical History  Diagnosis Date  . Depression   . Osteoarthritis   . Stroke \  . Dyslipidemia   . DM (diabetes mellitus)   . Nephrolithiasis   . Obesity     morbid  . Leg edema     right  . Hypertension   . CAD (coronary artery disease)   . Hemorrhoids     internal    Past Surgical History  Procedure Laterality Date  . Heart bypass  1996  . Angioplasty / stenting iliac    . Total knee arthroplasty      right     Allergies  Allergies  Allergen Reactions  . Penicillins Swelling and Other (See Comments)    redness  . Statins Other (See Comments)      muscle pain    HPI 73M with DM, HTN, HLD, CAD s/p CABG (1996) with extensive disease of native vessels and grafts, chronic pain, who presents with chest pain and back pain.   Carlos Herman reports that he developed (mid thoracic) back and (left costal margin) chest pain earlier this afternoon while watching a race on TV. He states "it just hurt...a discomfort." Described the pain as 6-8/10 in severity. Pain was not reproducible, pleuritic, or positional. Although he has multiple chronic pains, he was particularly concerned that the back pain was higher up (usually it is lumbar) and at one point he stated that the pain reminded him of pain he had prior to his CABG in 1996. When asked if it reminded him of pain prior to his recent stents, he reported "who knows." He also notes right calf pain and swelling but of which he says are chronic.   He reports that in the ED, pain improved first with SL NTG and then with a nitro patch. POC TnI 0.00. K 4.4, Cr 1.03. ECG  was without evidence of acute ischemia. On my interview, he was CP free. He was admitted to cardiology. D-dimer after admission returned at 3.04.  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 His LIMA is atretic with the LAD filling retrograde from the SVG to the Diagonal. The native RCA is patent with multiple stents. His last cath was 08/2007. He is on chronic Plavix. His myovue in 02/2009 was low risk with small inferior infarct mild periinfarct ischemia and EF 58%.  Home Medications  Prior to Admission medications   Medication Sig Start Date End Date Taking? Authorizing Provider  amLODipine (NORVASC) 10 MG tablet Take 10 mg by mouth at bedtime.   Yes Historical Provider, MD  aspirin EC 81 MG tablet Take 81 mg by mouth daily.   Yes Historical Provider, MD  Calcium Carbonate-Vitamin D (CALCIUM 600 + D PO) Take 1 tablet by mouth 2 (two) times daily.    Yes Historical Provider, MD  clopidogrel (PLAVIX) 75 MG tablet Take 1 tablet (75 mg total) by mouth daily. 09/02/13  Yes Josue Hector, MD  HYDROcodone-acetaminophen (NORCO) 10-325 MG per tablet Take 1 tablet by mouth 2 (two) times daily.  07/11/13  Yes Historical Provider, MD  isosorbide mononitrate (IMDUR)  60 MG 24 hr tablet Take 60 mg by mouth at bedtime.   Yes Historical Provider, MD  Magnesium 250 MG TABS Take 250 mg by mouth at bedtime.   Yes Historical Provider, MD  metFORMIN (GLUCOPHAGE) 500 MG tablet Take 1,000 mg by mouth 2 (two) times daily with a meal.   Yes Historical Provider, MD  metoprolol succinate (TOPROL-XL) 100 MG 24 hr tablet Take 50-100 mg by mouth daily. Take with or immediately following a meal: 1 tablet (100 mg) every morning and 1/2 tablet (50 mg) every night   Yes Historical Provider, MD  OVER THE COUNTER MEDICATION Take 2 capsules by mouth 2 (two) times daily. Anti-oxidant with spirulina   Yes Historical Provider, MD  ramipril (ALTACE) 10 MG capsule Take 1 capsule (10 mg total) by mouth daily. 09/14/13  Yes  Josue Hector, MD    Family History  History reviewed. No pertinent family history.  Social History  History   Social History  . Marital Status: Married    Spouse Name: N/A    Number of Children: N/A  . Years of Education: N/A   Occupational History  . Not on file.   Social History Main Topics  . Smoking status: Never Smoker   . Smokeless tobacco: Never Used  . Alcohol Use: Yes     Comment: Occasional  . Drug Use: No  . Sexual Activity: Not on file   Other Topics Concern  . Not on file   Social History Narrative   Enjoys racing and working on cars     Review of Systems General:  No chills, fever, night sweats or weight changes.  Cardiovascular:  CP per HPI. No dyspnea on exertion, edema, orthopnea, palpitations, paroxysmal nocturnal dyspnea. Dermatological: No rash, lesions/masses Respiratory: No cough, dyspnea Urologic: No hematuria, dysuria Abdominal:   No nausea, vomiting, diarrhea, bright red blood per rectum, melena, or hematemesis Neurologic:  No visual changes, wkns, changes in mental status. All other systems reviewed and are otherwise negative except as noted above.  Physical Exam  Blood pressure 145/62, pulse 63, temperature 97.6 F (36.4 C), temperature source Oral, resp. rate 17, height 5\' 4"  (1.626 m), weight 99.791 kg (220 lb), SpO2 97.00%.  General: Pleasant, NAD Psych: Normal affect. Neuro: Alert and oriented X 3. Moves all extremities spontaneously. HEENT: Normal  Neck: Supple without bruits or JVD. Lungs:  Resp regular and unlabored, CTA. Heart: RRR no s3, s4, or murmurs. Abdomen: Soft, non-tender, non-distended, BS + x 4.  Extremities: No clubbing, cyanosis or edema. DP/PT/Radials 2+ and equal bilaterally. R calf is more swollen and tender than L calf. No reddness.  Back and chest pain not reproducible with palpation.   Labs  Troponin [facility] Test)  Recent Labs  04/09/14 1639  TROPIPOC 0.00   No results found for this  basename: CKTOTAL, CKMB, TROPONINI,  in the last 72 hours Lab Results  Component Value Date   WBC 10.9* 04/09/2014   HGB 14.3 04/09/2014   HCT 41.7 04/09/2014   MCV 93.9 04/09/2014   PLT 308 04/09/2014    Recent Labs Lab 04/09/14 1630  NA 139  K 4.4  CL 97  CO2 29  BUN 14  CREATININE 1.03  CALCIUM 9.6  GLUCOSE 192*   Lab Results  Component Value Date   CHOL  Value: 156        ATP III CLASSIFICATION:  <200     mg/dL   Desirable  200-239  mg/dL  Borderline High  >=240    mg/dL   High 08/03/2007   HDL 28* 08/03/2007   LDLCALC  Value: 104        Total Cholesterol/HDL:CHD Risk Coronary Heart Disease Risk Table                     Men   Women  1/2 Average Risk   3.4   3.3* 08/03/2007   TRIG 119 08/03/2007   No results found for this basename: DDIMER     Radiology/Studies  Dg Chest 2 View  04/09/2014   CLINICAL DATA:  Left side chest pain, upper back pain today  EXAM: CHEST  2 VIEW  COMPARISON:  04/12/2009  FINDINGS: Cardiomediastinal silhouette is stable. Mild elevation of the right hemidiaphragm. There is linear atelectasis right base. No acute infiltrate or pulmonary edema. Status post CABG. Stable degenerative changes thoracolumbar spine.  IMPRESSION: No active disease.  Status post CABG.   Electronically Signed   By: Lahoma Crocker M.D.   On: 04/09/2014 18:44    TTE 12/12/03 LEFT VENTRICLE NORMAL LVID ed 41.7 mm 36-56 mm LVID es 33.1 mm -- FS 21 % 28-44% IVS ed 14.4 mm 6-11 mm LVPW ed 11.1 mm 6-11 mm AORTA NORMAL AoD (root) 36.8 mm <38 mm LEFT ATRIUM NORMAL LAD 42 mm 19-40 mm --------------------------------------------------------------- LEFT VENTRICLE: - Left ventricular size was normal. - Overall left ventricular systolic function was normal. - Left ventricular ejection fraction was estimated , range being 55 % to 65 %.. - This study was inadequate for the evaluation of left ventricular regional wall motion. - Left ventricular wall thickness was at the upper limits  of normal. AORTIC VALVE: - The aortic valve was trileaflet. - Aortic valve thickness was normal. AORTA: - The aortic root was normal in size. MITRAL VALVE: - There was mild mitral annular calcification. LEFT ATRIUM: - The left atrium was mildly dilated. RIGHT VENTRICLE: - Right ventricular size was normal. - Right ventricular systolic function was normal. - Right ventricular wall thickness was normal. PULMONIC VALVE: - The pulmonic valve was grossly normal. TRICUSPID VALVE: - The tricuspid valve structure was normal. PULMONARY ARTERY: - The pulmonary artery was normal size. RIGHT ATRIUM: - Right atrial size was normal. SYSTEMIC VEINS: - The inferior vena cava was normal. --------------------------------------------------------------- SUMMARY - Overall left ventricular systolic function was normal. Left ventricular ejection fraction was estimated , range being 55 % to 65 %.. This study was inadequate for the evaluation of left ventricular regional wall motion. Left ventricular wall thickness was at the upper limits of normal. - There was mild mitral annular calcification. - The left atrium was mildly dilated.  ECG 04/09/14: Atrial bigeminy. occasional abberantly conducted beats 07/26/13: Atrial bigeminy.   ASSESSMENT AND PLAN  59M with DM, HTN, HLD, CAD s/p CABG (1996) with extensive disease of native vessels and grafts, chronic pain, who presents with chest pain and back pain. Although the pain is atypical, the patient reports that the pain is similar to what he felt prior to his CABG. He also has right calf pain with chronic swelling and an elevated d-dimer raising concern for a PE, although his symptoms are pretty atypical for PE (or dissection for that matter). He has not had a stress or cath in years.   1. Cycle enzymes 2. CT angiogram 3. NPO at 4am for probable stress  Signed, Lamar Sprinkles, MD 04/09/2014, 8:21 PM  Addendum Paged by radiology to report  multiple bilateral PEs. No  RV dilatation. Plan for therapeutic Ramona lovenox now. Defer long term anticoag agent to day time when we have a better idea of what NOACs his insurance may cover. No stress test needed. Will order b/l LE [place] to assess clot burden given how much is in lungs. Of note, patient had a trip to [facility] about two weeks ago.

## 2014-04-09 NOTE — ED Notes (Addendum)
He reports an episode of CP 3 weeks ago and then again today. He states he noticed the pain while working on his cars. He took aspirin both times and the pain decreased some. hes concerned because hes had a heart attack in the past. He is a&ox4, resp e/u

## 2014-04-09 NOTE — ED Notes (Signed)
Pt refused 3rd Nitroglycerin tab.

## 2014-04-10 ENCOUNTER — Observation Stay (HOSPITAL_COMMUNITY): Payer: Medicare Other

## 2014-04-10 ENCOUNTER — Other Ambulatory Visit: Payer: Self-pay

## 2014-04-10 DIAGNOSIS — R072 Precordial pain: Secondary | ICD-10-CM | POA: Diagnosis not present

## 2014-04-10 DIAGNOSIS — I059 Rheumatic mitral valve disease, unspecified: Secondary | ICD-10-CM | POA: Diagnosis not present

## 2014-04-10 DIAGNOSIS — I2699 Other pulmonary embolism without acute cor pulmonale: Secondary | ICD-10-CM | POA: Diagnosis not present

## 2014-04-10 LAB — TROPONIN I
Troponin I: <0.3 ng/mL (ref <0.30)
Troponin I: <0.3 ng/mL (ref <0.30)

## 2014-04-10 LAB — CBC
HEMATOCRIT: 37.7 % — AB (ref 39.0–52.0)
HEMOGLOBIN: 12.8 g/dL — AB (ref 13.0–17.0)
MCH: 31.9 pg (ref 26.0–34.0)
MCHC: 34 g/dL (ref 30.0–36.0)
MCV: 94 fL (ref 78.0–100.0)
Platelets: 265 10*3/uL (ref 150–400)
RBC: 4.01 MIL/uL — ABNORMAL LOW (ref 4.22–5.81)
RDW: 12.4 % (ref 11.5–15.5)
WBC: 11.7 10*3/uL — AB (ref 4.0–10.5)

## 2014-04-10 LAB — GLUCOSE, CAPILLARY: Glucose-Capillary: 140 mg/dL — ABNORMAL HIGH (ref 70–99)

## 2014-04-10 MED ORDER — APIXABAN 5 MG PO TABS: 5.0000 mg | ORAL_TABLET | Freq: Two times a day (BID) | ORAL | Status: DC

## 2014-04-10 MED ORDER — IOHEXOL 350 MG/ML SOLN
100.0000 mL | Freq: Once | INTRAVENOUS | Status: AC | PRN
  Administered 2014-04-10: 100 mL via INTRAVENOUS

## 2014-04-10 MED ORDER — CLOPIDOGREL BISULFATE 75 MG PO TABS: 75.0000 mg | ORAL_TABLET | Freq: Every day | ORAL | Status: DC

## 2014-04-10 MED ORDER — ENOXAPARIN SODIUM 100 MG/ML ~~LOC~~ SOLN
100.0000 mg | Freq: Two times a day (BID) | SUBCUTANEOUS | Status: DC
  Filled 2014-04-10 (×2): qty 1

## 2014-04-10 MED ORDER — METFORMIN HCL 500 MG PO TABS
1000.0000 mg | ORAL_TABLET | Freq: Two times a day (BID) | ORAL | Status: DC
  Filled 2014-04-10: qty 2

## 2014-04-10 MED ORDER — ENOXAPARIN SODIUM 100 MG/ML ~~LOC~~ SOLN
100.0000 mg | SUBCUTANEOUS | Status: AC
  Administered 2014-04-10: 100 mg via SUBCUTANEOUS
  Filled 2014-04-10: qty 1

## 2014-04-10 MED ORDER — APIXABAN 5 MG PO TABS
10.0000 mg | ORAL_TABLET | Freq: Two times a day (BID) | ORAL | Status: DC
  Administered 2014-04-10 – 2014-04-11 (×3): 10 mg via ORAL
  Filled 2014-04-10 (×4): qty 2

## 2014-04-10 NOTE — Progress Notes (Signed)
UR completed 

## 2014-04-10 NOTE — Care Management Note (Unsigned)
    Page 1 of 1   04/10/2014     2:39:02 PM CARE MANAGEMENT NOTE 04/10/2014  Patient:  Dohmen,Kamsiyochukwu C   Account Number:  [id number]  Date Initiated:  04/10/2014  Documentation initiated by:  GRAVES-BIGELOW,Leilani Cespedes  Subjective/Objective Assessment:   Pt admitted for cp. CT positive for extensive bilateral acute pulmonary embolism. Plan to initiate on eliquis.     Action/Plan:   Benefits check in process and will make pt aware once completed. 30 day free card to be given to pt by CM.   Anticipated DC Date:  04/11/2014   Anticipated DC Plan:  [place]  CM consult  Medication Assistance      Choice offered to / List presented to:             Status of service:  In process, will continue to follow Medicare Important Message given?   (If response is "NO", the following Medicare IM given date fields will be blank) Date Medicare IM given:   Medicare IM given by:   Date Additional Medicare IM given:   Additional Medicare IM given by:    Discharge Disposition:    Per UR Regulation:  Reviewed for med. necessity/level of care/duration of stay  If discussed at [place] of Stay Meetings, dates discussed:    Comments:  S/W [facility] @ [place] # [phone]  ELIQUIS 5 MG  BID COVER- YES CO-PAY- $ 70.00  30 DAY SUPPLY TIER- [address] [place] # [phone] PHARMACY :  [facility], [facility], [facility] , [facility]  ** APIXABAN  NOT ON FORMULARY **

## 2014-04-10 NOTE — Plan of Care (Signed)
Problem: Consults Goal: Diagnosis - Venous Thromboembolism (VTE) Choose a selection PE (Pulmonary Embolism)     

## 2014-04-10 NOTE — Discharge Instructions (Signed)
Information on my medicine - ELIQUIS (apixaban)  This medication education was reviewed with me or my healthcare representative as part of my discharge preparation.  The pharmacist that spoke with me during my hospital stay was:  Lavenia Atlas, [facility]  Why was Eliquis prescribed for you? Eliquis was prescribed to treat blood clots that may have been found in the veins of your legs (deep vein thrombosis) or in your lungs (pulmonary embolism) and to reduce the risk of them occurring again.  What do You need to know about Eliquis ? The starting dose is 10 mg (two 5 mg tablets) taken TWICE daily for the FIRST SEVEN (7) DAYS, then on (enter date)  04/17/2014  the dose is reduced to ONE 5 mg tablet taken TWICE daily.  Eliquis may be taken with or without food.   Try to take the dose about the same time in the morning and in the evening. If you have difficulty swallowing the tablet whole please discuss with your pharmacist how to take the medication safely.  Take Eliquis exactly as prescribed and DO NOT stop taking Eliquis without talking to the doctor who prescribed the medication.  Stopping may increase your risk of developing a new blood clot.  Refill your prescription before you run out.  After discharge, you should have regular check-up appointments with your healthcare provider that is prescribing your Eliquis.    What do you do if you miss a dose? If a dose of ELIQUIS is not taken at the scheduled time, take it as soon as possible on the same day and twice-daily administration should be resumed. The dose should not be doubled to make up for a missed dose.  Important Safety Information A possible side effect of Eliquis is bleeding. You should call your healthcare provider right away if you experience any of the following:   Bleeding from an injury or your nose that does not stop.   Unusual colored urine (red or dark brown) or unusual colored stools (red or black).   Unusual  bruising for unknown reasons.   A serious fall or if you hit your head (even if there is no bleeding).  Some medicines may interact with Eliquis and might increase your risk of bleeding or clotting while on Eliquis. To help avoid this, consult your healthcare provider or pharmacist prior to using any new prescription or non-prescription medications, including herbals, vitamins, non-steroidal anti-inflammatory drugs (NSAIDs) and supplements.  This website has more information on Eliquis (apixaban): http://www.eliquis.com/eliquis/home

## 2014-04-10 NOTE — Progress Notes (Signed)
Patient ID: Carlos Herman, male   DOB: [birth year], 73 y.o.   MRN: [id number]    Subjective:  Denies SSCP, palpitations or Dyspnea Primarily had back pain on admission  8 plus hr car ride to [place] 2 weeks ago may have ppt DVT  Objective:  Filed Vitals:   04/10/14 0230 04/10/14 0235 04/10/14 0400 04/10/14 0700  BP:   174/58 113/64  Pulse:   72 58  Temp:   98.1 F (36.7 C)   TempSrc:   Oral   Resp:   18 17  Height:      Weight:      SpO2: 89% 96% 100% 100%    Intake/Output from previous day: No intake or output data in the 24 hours ending 04/10/14 4193  Physical Exam: Affect appropriate Healthy:  appears stated age HEENT: normal Neck supple with no adenopathy JVP normal no bruits no thyromegaly Lungs clear with no wheezing and good diaphragmatic motion Heart:  S1/S2 no murmur, no rub, gallop or click PMI normal Abdomen: benighn, BS positve, no tenderness, no AAA no bruit.  No HSM or HJR Distal pulses intact with no bruits No edema Neuro non-focal Skin warm and dry No muscular weakness   Lab Results: Basic Metabolic Panel:  Recent Labs  04/09/14 1630  NA 139  K 4.4  CL 97  CO2 29  GLUCOSE 192*  BUN 14  CREATININE 1.03  CALCIUM 9.6   CBC:  Recent Labs  04/09/14 1630 04/10/14 0344  WBC 10.9* 11.7*  HGB 14.3 12.8*  HCT 41.7 37.7*  MCV 93.9 94.0  PLT 308 265   Cardiac Enzymes:  Recent Labs  04/09/14 2235 04/10/14 0046 04/10/14 0344  TROPONINI <0.30 <0.30 <0.30   D-Dimer:  Recent Labs  04/09/14 2235  DDIMER 3.04*    Imaging: Dg Chest 2 View  04/09/2014   CLINICAL DATA:  Left side chest pain, upper back pain today  EXAM: CHEST  2 VIEW  COMPARISON:  04/12/2009  FINDINGS: Cardiomediastinal silhouette is stable. Mild elevation of the right hemidiaphragm. There is linear atelectasis right base. No acute infiltrate or pulmonary edema. Status post CABG. Stable degenerative changes thoracolumbar spine.  IMPRESSION: No active disease.   Status post CABG.   Electronically Signed   By: Lahoma Crocker M.D.   On: 04/09/2014 18:44   Ct Angio Chest Pe W/cm &/or Wo Cm  04/10/2014   CLINICAL DATA:  Chest pain.  EXAM: CT ANGIOGRAPHY CHEST WITH CONTRAST  TECHNIQUE: Multidetector CT imaging of the chest was performed using the standard protocol during bolus administration of intravenous contrast. Multiplanar CT image reconstructions and MIPs were obtained to evaluate the vascular anatomy.  CONTRAST:  164mL OMNIPAQUE IOHEXOL 350 MG/ML SOLN  COMPARISON:  None.  FINDINGS: THORACIC INLET/BODY WALL:  Scout imaging positive for large volume extravasation of contrast into the left arm, grossly outlining the brachialis or lower biceps musculature. This is 80 cc per technologist's estimation. No concerning neurovascular symptoms are noted by my exam and interview.  MEDIASTINUM:  There is extensive bilateral acute pulmonary embolism, with clot beginning at the level of the lobar pulmonary arteries and extending into multiple segments, especially in the lower lobes. There is no right heart enlargement suggestive of strain.  Mild cardiomegaly. Diffuse coronary atherosclerosis, status post CABG. There is minimal opacification of the aorta, limiting assessment of saphenous vein graft patency. The LIMA is small, limiting assessment of enhancement.  No adenopathy.  LUNG WINDOWS:  Low lung volumes. There is right diaphragmatic  eventration, elevating the liver. No consolidation, edema, or effusion.  UPPER ABDOMEN:  Cholelithiasis.  OSSEOUS:  No acute fracture.  No suspicious lytic or blastic lesions.  Critical Value/emergent results were called by telephone at the time of interpretation on 04/10/2014 at 2:18 am to Dr. Lamar Sprinkles , who verbally acknowledged these results.  Review of the MIP images confirms the above findings.  IMPRESSION: 1. Extensive bilateral lobar and segmental acute pulmonary embolism. 2. 80 cc contrast extravasation into left arm. 3. Cholelithiasis.    Electronically Signed   By: Jorje Guild M.D.   On: 04/10/2014 02:21    Cardiac Studies:  ECG:  SR nonspecific ST chages   Telemetry:  NSR   Echo: pending   Medications:   . amLODipine  10 mg Oral QHS  . aspirin EC  81 mg Oral Daily  . clopidogrel  75 mg Oral Q breakfast  . enoxaparin (LOVENOX) injection  100 mg Subcutaneous Q12H  . HYDROcodone-acetaminophen  1 tablet Oral BID  . isosorbide mononitrate  60 mg Oral QHS  . magnesium oxide  200 mg Oral QHS  . [START ON 04/12/2014] metFORMIN  1,000 mg Oral BID WC  . metoprolol succinate  100 mg Oral Daily  . metoprolol succinate  50 mg Oral QHS  . ramipril  10 mg Oral Daily       Assessment/Plan:  PE:  Start Eliquis 10 bid for 7 days then 5 bid for at least 6 months likely a year sats ok Check echoand LE venous duplex CAD:  stabel r/o no indication for cath cotninue ASA  D/c Plavix  HTN:  Continue ACE  Jenkins Rouge 04/10/2014, 8:11 AM

## 2014-04-10 NOTE — Progress Notes (Signed)
Echocardiogram 2D Echocardiogram has been performed.  Carlos Herman 04/10/2014, 1:13 PM

## 2014-04-10 NOTE — Progress Notes (Signed)
*  PRELIMINARY RESULTS* Vascular Ultrasound Lower extremity venous duplex has been completed.  Preliminary findings: no evidence of DVT.  Landry Mellow, RDMS, RVT  04/10/2014, 12:13 PM

## 2014-04-10 NOTE — Progress Notes (Signed)
ANTICOAGULATION CONSULT NOTE - Initial Consult  Pharmacy Consult for Lovenox Indication: pulmonary embolus  Allergies  Allergen Reactions  . Penicillins Swelling and Other (See Comments)    redness  . Statins Other (See Comments)      muscle pain    Patient Measurements: Height: 5\' 8"  (172.7 cm) Weight: 217 lb 14.4 oz (98.839 kg) IBW/kg (Calculated) : 68.4  Vital Signs: Temp: 98.9 F (37.2 C) (10/11 2203) Temp Source: Oral (10/11 2203) BP: 127/76 mmHg (10/11 2203) Pulse Rate: 79 (10/11 2203)  Labs:  Recent Labs  04/09/14 1630 04/09/14 2235 04/10/14 0046  HGB 14.3  --   --   HCT 41.7  --   --   PLT 308  --   --   CREATININE 1.03  --   --   TROPONINI  --  <0.30 <0.30    Estimated Creatinine Clearance: 73.9 ml/min (by C-G formula based on Cr of 1.03).   Medical History: Past Medical History  Diagnosis Date  . Depression   . Osteoarthritis   . Stroke \  . Dyslipidemia   . DM (diabetes mellitus)   . Nephrolithiasis   . Obesity     morbid  . Leg edema     right  . Hypertension   . CAD (coronary artery disease)   . Hemorrhoids     internal    Medications:  Prescriptions prior to admission  Medication Sig Dispense Refill  . amLODipine (NORVASC) 10 MG tablet Take 10 mg by mouth at bedtime.      Marland Kitchen aspirin EC 81 MG tablet Take 81 mg by mouth daily.      . Calcium Carbonate-Vitamin D (CALCIUM 600 + D PO) Take 1 tablet by mouth 2 (two) times daily.       . clopidogrel (PLAVIX) 75 MG tablet Take 1 tablet (75 mg total) by mouth daily.  90 tablet  1  . HYDROcodone-acetaminophen (NORCO) 10-325 MG per tablet Take 1 tablet by mouth 2 (two) times daily.       . isosorbide mononitrate (IMDUR) 60 MG 24 hr tablet Take 60 mg by mouth at bedtime.      . Magnesium 250 MG TABS Take 250 mg by mouth at bedtime.      . metFORMIN (GLUCOPHAGE) 500 MG tablet Take 1,000 mg by mouth 2 (two) times daily with a meal.      . metoprolol succinate (TOPROL-XL) 100 MG 24 hr tablet Take  50-100 mg by mouth daily. Take with or immediately following a meal: 1 tablet (100 mg) every morning and 1/2 tablet (50 mg) every night      . OVER THE COUNTER MEDICATION Take 2 capsules by mouth 2 (two) times daily. Anti-oxidant with spirulina      . ramipril (ALTACE) 10 MG capsule Take 1 capsule (10 mg total) by mouth daily.  90 capsule  3   Scheduled:  . amLODipine  10 mg Oral QHS  . aspirin EC  81 mg Oral Daily  . clopidogrel  75 mg Oral Q breakfast  . enoxaparin (LOVENOX) injection  100 mg Subcutaneous NOW  . enoxaparin (LOVENOX) injection  100 mg Subcutaneous Q12H  . HYDROcodone-acetaminophen  1 tablet Oral BID  . isosorbide mononitrate  60 mg Oral QHS  . magnesium oxide  200 mg Oral QHS  . [START ON 04/12/2014] metFORMIN  1,000 mg Oral BID WC  . metoprolol succinate  100 mg Oral Daily  . metoprolol succinate  50 mg Oral QHS  .  ramipril  10 mg Oral Daily    Assessment: 73yo male c/o CP w/ prior MI, troponin negative, CT reveals extensive PE, to begin anticoagulation.  Goal of Therapy:  Anti-Xa level 0.6-1 units/ml 4hrs after LMWH dose given Monitor platelets by anticoagulation protocol: Yes   Plan:  Will begin Lovenox 100mg  SQ Q12H and monitor CBC; f/u long-term anticoag, plan from cards is NOAC when determined what insurance will cover.  Wynona Neat, PharmD, BCPS  04/10/2014,2:27 AM

## 2014-04-10 NOTE — Progress Notes (Signed)
This patient has received 80 ml's of IV Omnipaque 350 contrast extravasation into left antecubital area during a CT PE exam.  The exam was performed on 04/10/2014  Site / affected area assessed by Dr. Sandi Mariscal

## 2014-04-10 NOTE — Progress Notes (Signed)
Pt had IV contrast extravasation of approx 80cc in CTA.  Radiologist notified.  Pt denies pain to affected area of left AC/upper arm area.  Site swollen, no change in skin tone, 2+ pulses.  Will continue to monitor.

## 2014-04-10 NOTE — Progress Notes (Signed)
ANTICOAGULATION CONSULT NOTE - Follow Up Consult  Pharmacy Consult for Lovenox>>Apixaban  Indication: pulmonary embolus  Allergies  Allergen Reactions  . Penicillins Swelling and Other (See Comments)    redness  . Statins Other (See Comments)      muscle pain    Patient Measurements: Height: 5\' 8"  (172.7 cm) Weight: 217 lb 14.4 oz (98.839 kg) IBW/kg (Calculated) : 68.4  Vital Signs: Temp: 98.1 F (36.7 C) (10/12 0400) Temp Source: Oral (10/12 0400) BP: 113/64 mmHg (10/12 0700) Pulse Rate: 58 (10/12 0700)  Labs:  Recent Labs  04/09/14 1630 04/09/14 2235 04/10/14 0046 04/10/14 0344  HGB 14.3  --   --  12.8*  HCT 41.7  --   --  37.7*  PLT 308  --   --  265  CREATININE 1.03  --   --   --   TROPONINI  --  <0.30 <0.30 <0.30    Estimated Creatinine Clearance: 73.9 ml/min (by C-G formula based on Cr of 1.03).   Medical History: Past Medical History  Diagnosis Date  . Depression   . Osteoarthritis   . Stroke \  . Dyslipidemia   . DM (diabetes mellitus)   . Nephrolithiasis   . Obesity     morbid  . Leg edema     right  . Hypertension   . CAD (coronary artery disease)   . Hemorrhoids     internal    Medications:  Prescriptions prior to admission  Medication Sig Dispense Refill  . amLODipine (NORVASC) 10 MG tablet Take 10 mg by mouth at bedtime.      Marland Kitchen aspirin EC 81 MG tablet Take 81 mg by mouth daily.      . Calcium Carbonate-Vitamin D (CALCIUM 600 + D PO) Take 1 tablet by mouth 2 (two) times daily.       . clopidogrel (PLAVIX) 75 MG tablet Take 1 tablet (75 mg total) by mouth daily.  90 tablet  1  . HYDROcodone-acetaminophen (NORCO) 10-325 MG per tablet Take 1 tablet by mouth 2 (two) times daily.       . isosorbide mononitrate (IMDUR) 60 MG 24 hr tablet Take 60 mg by mouth at bedtime.      . Magnesium 250 MG TABS Take 250 mg by mouth at bedtime.      . metFORMIN (GLUCOPHAGE) 500 MG tablet Take 1,000 mg by mouth 2 (two) times daily with a meal.      .  metoprolol succinate (TOPROL-XL) 100 MG 24 hr tablet Take 50-100 mg by mouth daily. Take with or immediately following a meal: 1 tablet (100 mg) every morning and 1/2 tablet (50 mg) every night      . OVER THE COUNTER MEDICATION Take 2 capsules by mouth 2 (two) times daily. Anti-oxidant with spirulina      . ramipril (ALTACE) 10 MG capsule Take 1 capsule (10 mg total) by mouth daily.  90 capsule  3   Scheduled:  . amLODipine  10 mg Oral QHS  . apixaban  10 mg Oral BID  . [START ON 04/17/2014] apixaban  5 mg Oral BID  . aspirin EC  81 mg Oral Daily  . HYDROcodone-acetaminophen  1 tablet Oral BID  . isosorbide mononitrate  60 mg Oral QHS  . magnesium oxide  200 mg Oral QHS  . [START ON 04/12/2014] metFORMIN  1,000 mg Oral BID WC  . metoprolol succinate  100 mg Oral Daily  . metoprolol succinate  50 mg Oral QHS  .  ramipril  10 mg Oral Daily    Assessment: 73yo male c/o CP after 8 plus hour car ride w/ prior MI, troponin negative. CT reveals extensive bilateral PE and now checking LE venous duplex to r/o DVT. Patient is currently on therapeutic Lovenox but pharmacy has been consulted to switch to apixaban. He received his last dose of Lovenox at ~  0246 today. Hgb 12.8, Plt wnl. Wt 98.8kg, SCr 1.03. Pt meets criteria for regular apixaban dose.    Goal of Therapy:  VTE treatment  Monitor platelets by anticoagulation protocol: Yes   Plan:  -Stop Lovenox  -Start Apixaban 10 mg twice daily x 7 days. Give first dose at 1300 today (10 hours after previous Lovenox dose)  -After 7 days, continue apixaban at 5 mg twice daily.  -Provide apixaban education to patient  -F/u venous duplex results    Albertina Parr, PharmD.  Clinical Pharmacist Pager [phone]

## 2014-04-10 NOTE — ED Provider Notes (Signed)
73 y.o. Male with cardiac history presents with  chest pain c.w. Prior cardiac chest pain.  WDWN male nad cv - rrr Lungs cta  EKG Interpretation  Date/Time:  Sunday April 09 2014 16:30:13 EDT Ventricular Rate:  82 PR Interval:    QRS Duration: 82 QT Interval:  422 QTC Calculation: 493 R Axis:   29 Text Interpretation:  AV Block with 2:1 A-V conduction Nonspecific ST abnormality Prolonged QT Abnormal ECG Reconfirmed by Adanna Zuckerman MD, Andee Poles [phone]) on 04/09/2014 8:35:54 PM Also confirmed by Summerlyn Fickel MD, Andee Poles [phone])  on 04/09/2014 8:37:57 PM       I saw and evaluated the patient, reviewed the resident's note and I agree with the findings and plan.   EKG Interpretation   Date/Time:  Sunday April 09 2014 16:30:13 EDT Ventricular Rate:  82 PR Interval:    QRS Duration: 82 QT Interval:  422 QTC Calculation: 493 R Axis:   29 Text Interpretation:   AV Block with 2:1 A-V  conduction Nonspecific ST abnormality Prolonged QT Abnormal ECG  Reconfirmed by Hedda Crumbley MD, Andee Poles ([id number]) on 04/09/2014 8:35:54 PM Also  confirmed by Jeanell Sparrow MD, Andee Poles [phone])  on 04/09/2014 8:37:57 PM         Shaune Pollack, MD 04/10/14 0938

## 2014-04-11 DIAGNOSIS — I2699 Other pulmonary embolism without acute cor pulmonale: Secondary | ICD-10-CM | POA: Diagnosis not present

## 2014-04-11 DIAGNOSIS — R072 Precordial pain: Secondary | ICD-10-CM

## 2014-04-11 MED ORDER — APIXABAN 5 MG PO TABS: 10.0000 mg | ORAL_TABLET | Freq: Two times a day (BID) | ORAL | Status: DC

## 2014-04-11 MED ORDER — APIXABAN 5 MG PO TABS: 5.0000 mg | ORAL_TABLET | Freq: Two times a day (BID) | ORAL | Status: DC

## 2014-04-11 NOTE — Discharge Summary (Signed)
Physician Discharge Summary     Cardiologist:  Johnsie Cancel Patient ID: Carlos Herman MRN: [id number] DOB/AGE: June 14, 1941 73 y.o.  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   Carlos Herman reported that he developed (mid thoracic) back and (left costal margin) chest pain while watching a race on TV. He states "it just hurt...a discomfort." Described the pain as 6-8/10 in severity. Pain was not reproducible, pleuritic, or positional. Although he has multiple chronic pains, he was particularly concerned that the back pain was higher up (usually it is lumbar) and at one point he stated that the pain reminded him of pain he had prior to his CABG in 1996. When asked if it reminded him of pain prior to his recent stents, he reported "who knows." He also notes right calf pain and swelling but of which he says are chronic.   He reports that in the ED, pain improved first with SL NTG and then with a nitro patch. POC TnI 0.00. K 4.4, Cr 1.03. ECG was without evidence of acute ischemia. During initial interview, he was CP free. He was admitted to cardiology. D-dimer after admission returned at 3.04.   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 His LIMA is atretic with the LAD filling retrograde from the SVG to the Diagonal. The native RCA is patent with multiple stents. His last cath was 08/2007. He is on chronic Plavix. His myovue in 02/2009 was low risk with small inferior infarct mild periinfarct ischemia and EF 58%.  CTA of the chest was positive for multiple bilateral PEs. No RV dilatation seen.  Therapeutic [place] lovenox was started and later transitioned  to Apixaban 10mg  BID for seven days which will then decrease to 5mg  twice daily for at least six months.  Lower extremity venous dopplers were negative for DVT.  Echocardiogram revealed an EF of 55-60% mild MR, mild LAE.  His plavix was discontinued.  ACE continued for HTN.  The patient was seen by Dr. Johnsie Cancel who felt he was stable for DC home.    Consults: None  Significant Diagnostic Studies:  CT ANGIOGRAPHY CHEST WITH CONTRAST  TECHNIQUE:  Multidetector CT imaging of the chest was performed using the  standard protocol during bolus administration of intravenous  contrast. Multiplanar CT image reconstructions and MIPs were  obtained to evaluate the vascular anatomy.  CONTRAST: 162mL OMNIPAQUE IOHEXOL 350 MG/ML SOLN  COMPARISON: None.  FINDINGS:  THORACIC INLET/BODY WALL:  Scout imaging positive for large volume extravasation of contrast  into the left arm, grossly outlining the brachialis or lower biceps  musculature. This is 80 cc per technologist's estimation. No  concerning neurovascular symptoms are noted by my exam and  interview.  MEDIASTINUM:  There is extensive bilateral acute pulmonary embolism, with clot  beginning at the level of the lobar pulmonary arteries and extending  into multiple segments, especially in the lower lobes. There is no  right heart enlargement suggestive of strain.  Mild cardiomegaly. Diffuse coronary atherosclerosis, status post  CABG. There is minimal opacification of the aorta, limiting  assessment of saphenous vein graft patency. The LIMA is small,  limiting assessment of  enhancement.  No adenopathy.  LUNG WINDOWS:  Low lung volumes. There is right diaphragmatic eventration,  elevating the liver. No consolidation, edema, or effusion.  UPPER ABDOMEN:  Cholelithiasis.  OSSEOUS:  No acute fracture. No suspicious lytic or blastic lesions.  Critical Value/emergent results were called by telephone at the time  of interpretation on 04/10/2014 at  2:18 am to Dr. Lamar Sprinkles ,  who verbally acknowledged these results.  Review of the MIP images confirms the above findings.  IMPRESSION:  1. Extensive bilateral lobar and segmental acute pulmonary embolism.  2. 80 cc contrast extravasation into left arm.  3. Cholelithiasis.   Study Conclusions  - Procedure narrative: The rhythm appears to be atrial fibrillation. - Left ventricle: The cavity size was normal. Wall thickness was increased in a pattern of mild LVH. Systolic function was normal. The estimated ejection fraction was in the range of 55% to 60%. Wall motion was normal; there were no regional wall motion abnormalities. The study is not technically sufficient to allow evaluation of LV diastolic function. - Aorta: Aortic root dimension: 39 mm (ED). - Aortic root: The aortic root is top normal in size. - Mitral valve: Mildly thickened leaflets . There was mild regurgitation. - Left atrium: Mildly dilated (36 ml/m2).  Impressions:  - LVEF 55-60%, mild MR, mild LAE, no clear active atrial activity (appears to be a-fib), cannot determine diastolic function.   Treatments: See above  Discharge Exam: Blood pressure 100/56, pulse 52, temperature 98.2 F (36.8 C), temperature source Oral, resp. rate 18, height 5\' 8"  (1.727 m), weight 217 lb 13 oz (98.8 kg), SpO2 96.00%.   Disposition:       Discharge Instructions   Diet - low sodium heart healthy    Complete by:  As directed      Discharge instructions    Complete by:  As directed   Continue 10mg  of Apixaban tonight and twice daily through Sunday evening then start 5mg  twice daily on Monday the 19th.     Increase activity slowly    Complete by:  As directed             Medication List    STOP taking these medications       clopidogrel 75 MG tablet  Commonly known as:  PLAVIX      TAKE these medications       amLODipine 10 MG tablet  Commonly known as:  NORVASC  Take 10 mg by mouth at bedtime.      apixaban 5 MG Tabs tablet  Commonly known as:  ELIQUIS  Take 2 tablets (10 mg total) by mouth 2 (two) times daily.     apixaban 5 MG Tabs tablet  Commonly known as:  ELIQUIS  Take 1 tablet (5 mg total) by mouth 2 (two) times daily.  Start taking on:  04/17/2014     aspirin EC 81 MG tablet  Take 81 mg by mouth daily.     CALCIUM 600 + D PO  Take 1 tablet by mouth 2 (two) times daily.     HYDROcodone-acetaminophen 10-325 MG per tablet  Commonly known as:  NORCO  Take 1 tablet by mouth 2 (two) times daily.     isosorbide mononitrate 60 MG 24 hr tablet  Commonly known as:  IMDUR  Take 60 mg by mouth at bedtime.     Magnesium 250 MG Tabs  Take 250 mg by mouth at bedtime.     metFORMIN 500 MG tablet  Commonly  known as:  GLUCOPHAGE  Take 1,000 mg by mouth 2 (two) times daily with a meal.     metoprolol succinate 100 MG 24 hr tablet  Commonly known as:  TOPROL-XL  Take 50-100 mg by mouth daily. Take with or immediately following a meal: 1 tablet (100 mg) every morning and 1/2 tablet (50 mg) every night     OVER THE COUNTER MEDICATION  Take 2 capsules by mouth 2 (two) times daily. Anti-oxidant with spirulina     ramipril 10 MG capsule  Commonly known as:  ALTACE  Take 1 capsule (10 mg total) by mouth daily.       Follow-up Information   Follow up with Ermalinda Barrios, PA-C On 04/26/2014. (9:15 AM)    Specialty:  Cardiology   Contact information:   [address] [place] [address] [phone]       Signed: Tarri Fuller 04/11/2014, 9:42 AM

## 2014-04-11 NOTE — Progress Notes (Signed)
Patient ID: Carlos Herman, male   DOB: [birth year], 73 y.o.   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   Objective:  Filed Vitals:   04/10/14 2233 04/10/14 2236 04/11/14 0518 04/11/14 0700  BP: 134/67 134/67 99/74 100/56  Pulse:  63 70 52  Temp:   98.2 F (36.8 C)   TempSrc:   Oral   Resp:   18   Height:      Weight:   217 lb 13 oz (98.8 kg)   SpO2:   96%     Intake/Output from previous day:  Intake/Output Summary (Last 24 hours) at 04/11/14 0801 Last data filed at 04/10/14 1700  Gross per 24 hour  Intake    480 ml  Output      0 ml  Net    480 ml    Physical Exam: Affect appropriate Healthy:  appears stated age HEENT: normal Neck supple with no adenopathy JVP normal no bruits no thyromegaly Lungs clear with no wheezing and good diaphragmatic motion Heart:  S1/S2 no murmur, no rub, gallop or click PMI normal Abdomen: benighn, BS positve, no tenderness, no AAA no bruit.  No HSM or HJR Distal pulses intact with no bruits No edema Neuro non-focal Skin warm and dry No muscular weakness   Lab Results: Basic Metabolic Panel:  Recent Labs  04/09/14 1630  NA 139  K 4.4  CL 97  CO2 29  GLUCOSE 192*  BUN 14  CREATININE 1.03  CALCIUM 9.6   CBC:  Recent Labs  04/09/14 1630 04/10/14 0344  WBC 10.9* 11.7*  HGB 14.3 12.8*  HCT 41.7 37.7*  MCV 93.9 94.0  PLT 308 265   Cardiac Enzymes:  Recent Labs  04/09/14 2235 04/10/14 0046 04/10/14 0344  TROPONINI <0.30 <0.30 <0.30   D-Dimer:  Recent Labs  04/09/14 2235  DDIMER 3.04*    Imaging: Dg Chest 2 View  04/09/2014   CLINICAL DATA:  Left side chest pain, upper back pain today  EXAM: CHEST  2 VIEW  COMPARISON:  04/12/2009  FINDINGS: Cardiomediastinal silhouette is stable. Mild elevation of the right hemidiaphragm. There is linear atelectasis right base. No acute  infiltrate or pulmonary edema. Status post CABG. Stable degenerative changes thoracolumbar spine.  IMPRESSION: No active disease.  Status post CABG.   Electronically Signed   By: Lahoma Crocker M.D.   On: 04/09/2014 18:44   Ct Angio Chest Pe W/cm &/or Wo Cm  04/10/2014   CLINICAL DATA:  Chest pain.  EXAM: CT ANGIOGRAPHY CHEST WITH CONTRAST  TECHNIQUE: Multidetector CT imaging of the chest was performed using the standard protocol during bolus administration of intravenous contrast. Multiplanar CT image reconstructions and MIPs were obtained to evaluate the vascular anatomy.  CONTRAST:  181mL OMNIPAQUE IOHEXOL 350 MG/ML SOLN  COMPARISON:  None.  FINDINGS: THORACIC INLET/BODY WALL:  Scout imaging positive for large volume extravasation of contrast into the left arm, grossly outlining the brachialis or lower biceps musculature. This is 80 cc per technologist's estimation. No concerning neurovascular symptoms are noted by my exam and interview.  MEDIASTINUM:  There is extensive bilateral acute pulmonary embolism, with clot beginning at the level of the lobar pulmonary arteries and extending into multiple segments, especially in the lower lobes. There is no right heart enlargement suggestive of strain.  Mild cardiomegaly. Diffuse coronary  atherosclerosis, status post CABG. There is minimal opacification of the aorta, limiting assessment of saphenous vein graft patency. The LIMA is small, limiting assessment of enhancement.  No adenopathy.  LUNG WINDOWS:  Low lung volumes. There is right diaphragmatic eventration, elevating the liver. No consolidation, edema, or effusion.  UPPER ABDOMEN:  Cholelithiasis.  OSSEOUS:  No acute fracture.  No suspicious lytic or blastic lesions.  Critical Value/emergent results were called by telephone at the time of interpretation on 04/10/2014 at 2:18 am to Dr. Lamar Sprinkles , who verbally acknowledged these results.  Review of the MIP images confirms the above findings.  IMPRESSION: 1.  Extensive bilateral lobar and segmental acute pulmonary embolism. 2. 80 cc contrast extravasation into left arm. 3. Cholelithiasis.   Electronically Signed   By: Jorje Guild M.D.   On: 04/10/2014 02:21    Cardiac Studies:  ECG:  SR nonspecific ST chages   Telemetry:  NSR   Echo: pending   Medications:   . amLODipine  10 mg Oral QHS  . apixaban  10 mg Oral BID  . [START ON 04/17/2014] apixaban  5 mg Oral BID  . aspirin EC  81 mg Oral Daily  . HYDROcodone-acetaminophen  1 tablet Oral BID  . isosorbide mononitrate  60 mg Oral QHS  . magnesium oxide  200 mg Oral QHS  . [START ON 04/12/2014] metFORMIN  1,000 mg Oral BID WC  . metoprolol succinate  100 mg Oral Daily  . metoprolol succinate  50 mg Oral QHS  . ramipril  10 mg Oral Daily       Assessment/Plan:  PE:  Start Eliquis 10 bid for 7 days then 5 bid for at least 6 months likely a year sats ok Echo with no RV strain Normal EF LE duplex with no DVT CAD:  stabel r/o no indication for cath cotninue ASA  D/c Plavix  HTN:  Continue ACE  D/C home Discussed bleeding issues with eliquis  F/u with me next available   Jenkins Rouge 04/11/2014, 8:01 AM

## 2014-04-26 ENCOUNTER — Encounter: Payer: Self-pay | Admitting: Physician Assistant

## 2014-04-26 ENCOUNTER — Ambulatory Visit (INDEPENDENT_AMBULATORY_CARE_PROVIDER_SITE_OTHER): Payer: Medicare Other | Admitting: Physician Assistant

## 2014-04-26 VITALS — BP 110/60 | HR 76 | Ht 68.0 in | Wt 221.8 lb

## 2014-04-26 DIAGNOSIS — I209 Angina pectoris, unspecified: Secondary | ICD-10-CM | POA: Diagnosis not present

## 2014-04-26 DIAGNOSIS — M545 Low back pain, unspecified: Secondary | ICD-10-CM

## 2014-04-26 DIAGNOSIS — M79604 Pain in right leg: Secondary | ICD-10-CM

## 2014-04-26 DIAGNOSIS — Z86711 Personal history of pulmonary embolism: Secondary | ICD-10-CM

## 2014-04-26 DIAGNOSIS — I714 Abdominal aortic aneurysm, without rupture, unspecified: Secondary | ICD-10-CM

## 2014-04-26 DIAGNOSIS — I25701 Atherosclerosis of coronary artery bypass graft(s), unspecified, with angina pectoris with documented spasm: Secondary | ICD-10-CM | POA: Diagnosis not present

## 2014-04-26 DIAGNOSIS — M549 Dorsalgia, unspecified: Secondary | ICD-10-CM | POA: Insufficient documentation

## 2014-04-26 DIAGNOSIS — R600 Localized edema: Secondary | ICD-10-CM | POA: Diagnosis not present

## 2014-04-26 DIAGNOSIS — I2510 Atherosclerotic heart disease of native coronary artery without angina pectoris: Secondary | ICD-10-CM

## 2014-04-26 NOTE — Assessment & Plan Note (Signed)
Bilateral pulmonary embolus currently on Apixaban 5 mg twice a day

## 2014-04-26 NOTE — Assessment & Plan Note (Addendum)
Patient is complaining of back pain similar to when he had his pulmonary embolus and MI as well as pain in his right foot and leg with associated swelling. I discussed the patient with Dr.Nishan who recommends abdominal and pelvic CT with contrast to rule out pelvic vein clot and abdominal aortic aneurysm. Creatinine was normal in the hospital.

## 2014-04-26 NOTE — Assessment & Plan Note (Signed)
Patient ruled out for an MI in the hospital. Not having chest pain

## 2014-04-26 NOTE — Patient Instructions (Signed)
ABD/ Pelvis CT: Non-Cardiac CT scanning, (CAT scanning), is a noninvasive, special x-ray that produces cross-sectional images of the body using x-rays and a computer. CT scans help physicians diagnose and treat medical conditions. For some CT exams, a contrast material is used to enhance visibility in the area of the body being studied. CT scans provide greater clarity and reveal more details than regular x-ray exams.  Your physician recommends that you schedule a follow-up appointment in: 2 weeks with Dr. Johnsie Cancel.

## 2014-04-26 NOTE — Progress Notes (Signed)
[name] who is admitted to the hospital with bilateral pulmonary embolus. He was treated with Apixaban 10 mg twice a day for 7 days decrease to 5 mg twice a day for at least 6 months. Her extremity Dopplers were negative for DVT. Echocardiogram revealed an EF of 55-60% with mild MR, mild L. left atrial enlargement.  The patient does have coronary artery disease status post CABG in 1996 with known occlusion of his grafts to the PDA and OM. His LIMA is atraumatic with the LAD filling retrograde from his SVG to the diagonal. The native RCA is patent with multiple stents. His last cath was in 2009. He was on chronic Plavix but this was stopped. His last Myoview in 2010 was low risk with small inferior infarct, mild peri-infarct ischemia EF 58%.  Patient comes in today complaining of back pain that kept him awake last night. He says this is what presented when he had his MI and his pulmonary embolus. He says he has good days and bad days and just isn't feeling well today. He also complains of right leg and foot pain. He has chronic pain on his right side because of multiple surgeries that seems to be worse. He denies any significant dyspnea or dyspnea on exertion or chest pain.  Allergies  Allergen Reactions  . Penicillins Swelling and Other (See Comments)    redness  . Statins Other (See Comments)      muscle pain     Current Outpatient Prescriptions  Medication Sig Dispense Refill  . amLODipine (NORVASC) 10 MG tablet Take 10 mg by mouth at bedtime.      Marland Kitchen apixaban (ELIQUIS) 5 MG TABS tablet Take 2 tablets (10 mg total) by mouth 2 (two) times daily.  22 tablet  0  . apixaban (ELIQUIS) 5 MG TABS tablet Take 1 tablet (5 mg total) by mouth 2 (two) times daily.  60 tablet  6  . aspirin EC 81 MG tablet Take 81 mg by mouth daily.      . Calcium Carbonate-Vitamin D (CALCIUM 600 + D PO) Take 1 tablet by mouth 2 (two) times daily.       Marland Kitchen  HYDROcodone-acetaminophen (NORCO) 10-325 MG per tablet Take 1 tablet by mouth 2 (two) times daily.       . isosorbide mononitrate (IMDUR) 60 MG 24 hr tablet Take 60 mg by mouth at bedtime.      . Magnesium 250 MG TABS Take 250 mg by mouth at bedtime.      . metFORMIN (GLUCOPHAGE) 500 MG tablet Take 1,000 mg by mouth 2 (two) times daily with a meal.      . metoprolol succinate (TOPROL-XL) 100 MG 24 hr tablet Take 50-100 mg by mouth daily. Take with or immediately following a meal: 1 tablet (100 mg) every morning and 1/2 tablet (50 mg) every night      . OVER THE COUNTER MEDICATION Take 2 capsules by mouth 2 (two) times daily. Anti-oxidant with spirulina      . ramipril (ALTACE) 10 MG capsule Take 1 capsule (10 mg total) by mouth daily.  90 capsule  3   No current facility-administered medications for this visit.    Past Medical History  Diagnosis Date  . Depression   . Osteoarthritis   . Stroke \  . Dyslipidemia   . DM (diabetes mellitus)   . Nephrolithiasis   . Obesity  morbid  . Leg edema     right  . Hypertension   . CAD (coronary artery disease)   . Hemorrhoids     internal    Past Surgical History  Procedure Laterality Date  . Heart bypass  1996  . Angioplasty / stenting iliac    . Total knee arthroplasty      right    No family history on file.  History   Social History  . Marital Status: Married    Spouse Name: N/A    Number of Children: N/A  . Years of Education: N/A   Occupational History  . Not on file.   Social History Main Topics  . Smoking status: Never Smoker   . Smokeless tobacco: Never Used  . Alcohol Use: Yes     Comment: Occasional  . Drug Use: No  . Sexual Activity: Not on file   Other Topics Concern  . Not on file   Social History Narrative   Enjoys racing and working on cars    ROS: See history of present illness otherwise negative  BP 110/60  Pulse 76  Ht 5\' 8"  (1.727 m)  Wt 221 lb 12.8 oz (100.608 kg)  BMI 33.73  kg/m2  PHYSICAL EXAM: Well-nournished, in no acute distress. Neck: No JVD, HJR, Bruit, or thyroid enlargement  Lungs: No tachypnea, clear without wheezing, rales, or rhonchi  Cardiovascular: RRR, PMI not displaced, Normal S1 and S2, no murmurs, gallops, bruit, thrill, or heave.  Abdomen: BS normal. Soft without organomegaly, masses, lesions or tenderness.  Extremities: Right leg with 1+ edema slight erythema, decreased distal pulses bilaterally  , otherwise lower extremities without cyanosis, clubbing.   SKin: Warm, no lesions or rashes   Musculoskeletal: No deformities  Neuro: no focal signs   Wt Readings from Last 3 Encounters:  04/11/14 217 lb 13 oz (98.8 kg)  02/07/14 223 lb (101.152 kg)  07/26/13 228 lb (103.42 kg)    Lab Results  Component Value Date   WBC 11.7* 04/10/2014   HGB 12.8* 04/10/2014   HCT 37.7* 04/10/2014   PLT 265 04/10/2014   GLUCOSE 192* 04/09/2014   CHOL  Value: 156        ATP III CLASSIFICATION:  <200     mg/dL   Desirable  200-239  mg/dL   Borderline High  >=240    mg/dL   High 08/03/2007   TRIG 119 08/03/2007   HDL 28* 08/03/2007   LDLCALC  Value: 104        Total Cholesterol/HDL:CHD Risk Coronary Heart Disease Risk Table                     Men   Women  1/2 Average Risk   3.4   3.3* 08/03/2007   ALT 26 10/23/2008   AST 26 10/23/2008   NA 139 04/09/2014   K 4.4 04/09/2014   CL 97 04/09/2014   CREATININE 1.03 04/09/2014   BUN 14 04/09/2014   CO2 29 04/09/2014   INR 1.01 02/11/2010   HGBA1C  Value: 7.4 (NOTE) The ADA recommends the following therapeutic goal for glycemic control related to Hgb A1c measurement: Goal of therapy: <6.5 Hgb A1c  Reference: American Diabetes Association: Clinical Practice Recommendations 2010, Diabetes Care, 2010, 33: (Suppl  1).* 10/16/2008   CT ANGIOGRAPHY CHEST WITH CONTRAST   TECHNIQUE:   Multidetector CT imaging of the chest was performed using the   standard protocol during bolus administration of intravenous  contrast. Multiplanar CT image reconstructions and MIPs were   obtained to evaluate the vascular anatomy.   CONTRAST: 155mL OMNIPAQUE IOHEXOL 350 MG/ML SOLN   COMPARISON: None.   FINDINGS:   THORACIC INLET/BODY WALL:   Scout imaging positive for large volume extravasation of contrast   into the left arm, grossly outlining the brachialis or lower biceps   musculature. This is 80 cc per technologist's estimation. No   concerning neurovascular symptoms are noted by my exam and   interview.   MEDIASTINUM:   There is extensive bilateral acute pulmonary embolism, with clot   beginning at the level of the lobar pulmonary arteries and extending   into multiple segments, especially in the lower lobes. There is no   right heart enlargement suggestive of strain.   Mild cardiomegaly. Diffuse coronary atherosclerosis, status post   CABG. There is minimal opacification of the aorta, limiting   assessment of saphenous vein graft patency. The LIMA is small,   limiting assessment of enhancement.   No adenopathy.   LUNG WINDOWS:   Low lung volumes. There is right diaphragmatic eventration,   elevating the liver. No consolidation, edema, or effusion.   UPPER ABDOMEN:   Cholelithiasis.   OSSEOUS:   No acute fracture. No suspicious lytic or blastic lesions.   Critical Value/emergent results were called by telephone at the time   of interpretation on 04/10/2014 at 2:18 am to Dr. Lamar Sprinkles ,   who verbally acknowledged these results.   Review of the MIP images confirms the above findings.   IMPRESSION:   1. Extensive bilateral lobar and segmental acute pulmonary embolism.   2. 80 cc contrast extravasation into left arm.   3. Cholelithiasis.  Study Conclusions  - Procedure narrative: The rhythm appears to be atrial fibrillation. - Left ventricle: The cavity size was normal. Wall thickness was increased in a pattern of mild LVH. Systolic function was normal. The estimated ejection fraction  was in the range of 55% to 60%. Wall motion was normal; there were no regional wall motion abnormalities. The study is not technically sufficient to allow evaluation of LV diastolic function. - Aorta: Aortic root dimension: 39 mm (ED). - Aortic root: The aortic root is top normal in size. - Mitral valve: Mildly thickened leaflets . There was mild regurgitation. - Left atrium: Mildly dilated (36 ml/m2).  Impressions:  - LVEF 55-60%, mild MR, mild LAE, no clear active atrial activity (appears to be a-fib), cannot determine diastolic function.    EKG: Normal sinus rhythm PAC and PVC, no acute change

## 2014-04-26 NOTE — Assessment & Plan Note (Signed)
Blood pressure stable ? ?

## 2014-04-27 ENCOUNTER — Ambulatory Visit (INDEPENDENT_AMBULATORY_CARE_PROVIDER_SITE_OTHER)
Admission: RE | Admit: 2014-04-27 | Discharge: 2014-04-27 | Disposition: A | Discharge status: Home / Self Care | Payer: Medicare Other | Source: Ambulatory Visit | Attending: Cardiovascular Disease | Admitting: Cardiovascular Disease

## 2014-04-27 DIAGNOSIS — I714 Abdominal aortic aneurysm, without rupture, unspecified: Secondary | ICD-10-CM

## 2014-04-27 DIAGNOSIS — M545 Low back pain, unspecified: Secondary | ICD-10-CM

## 2014-04-27 DIAGNOSIS — I2699 Other pulmonary embolism without acute cor pulmonale: Secondary | ICD-10-CM | POA: Diagnosis not present

## 2014-04-27 DIAGNOSIS — N200 Calculus of kidney: Secondary | ICD-10-CM | POA: Diagnosis not present

## 2014-04-27 DIAGNOSIS — M79604 Pain in right leg: Secondary | ICD-10-CM | POA: Diagnosis not present

## 2014-04-27 DIAGNOSIS — Z86711 Personal history of pulmonary embolism: Secondary | ICD-10-CM | POA: Diagnosis not present

## 2014-04-27 DIAGNOSIS — I2510 Atherosclerotic heart disease of native coronary artery without angina pectoris: Secondary | ICD-10-CM | POA: Diagnosis not present

## 2014-04-27 DIAGNOSIS — R600 Localized edema: Secondary | ICD-10-CM | POA: Diagnosis not present

## 2014-04-27 DIAGNOSIS — E1165 Type 2 diabetes mellitus with hyperglycemia: Secondary | ICD-10-CM | POA: Diagnosis not present

## 2014-04-27 DIAGNOSIS — K8070 Calculus of gallbladder and bile duct without cholecystitis without obstruction: Secondary | ICD-10-CM | POA: Diagnosis not present

## 2014-04-27 DIAGNOSIS — E784 Other hyperlipidemia: Secondary | ICD-10-CM | POA: Diagnosis not present

## 2014-05-04 ENCOUNTER — Telehealth: Payer: Self-pay | Admitting: *Deleted

## 2014-05-04 MED ORDER — METOPROLOL SUCCINATE ER 100 MG PO TB24: 100.0000 mg | ORAL_TABLET | Freq: Every day | ORAL | Status: DC

## 2014-05-04 MED ORDER — METOPROLOL SUCCINATE ER 100 MG PO TB24: 50.0000 mg | ORAL_TABLET | Freq: Every day | ORAL | Status: DC

## 2014-05-04 NOTE — Telephone Encounter (Signed)
Would not escribe. Will call [facility] in [place]

## 2014-05-04 NOTE — Telephone Encounter (Signed)
Received fax for refill on metoprolol 100 mg 1 tab in AM and 1/2 PM

## 2014-05-12 ENCOUNTER — Other Ambulatory Visit: Payer: Self-pay

## 2014-05-12 MED ORDER — AMLODIPINE BESYLATE 10 MG PO TABS: 10.0000 mg | ORAL_TABLET | Freq: Every day | ORAL | Status: DC

## 2014-06-01 DIAGNOSIS — G894 Chronic pain syndrome: Secondary | ICD-10-CM | POA: Diagnosis not present

## 2014-06-01 DIAGNOSIS — Z79891 Long term (current) use of opiate analgesic: Secondary | ICD-10-CM | POA: Diagnosis not present

## 2014-06-01 DIAGNOSIS — G5721 Lesion of femoral nerve, right lower limb: Secondary | ICD-10-CM | POA: Diagnosis not present

## 2014-06-01 DIAGNOSIS — M150 Primary generalized (osteo)arthritis: Secondary | ICD-10-CM | POA: Diagnosis not present

## 2014-06-02 DIAGNOSIS — E291 Testicular hypofunction: Secondary | ICD-10-CM | POA: Diagnosis not present

## 2014-06-02 DIAGNOSIS — E785 Hyperlipidemia, unspecified: Secondary | ICD-10-CM | POA: Diagnosis not present

## 2014-06-02 DIAGNOSIS — E1165 Type 2 diabetes mellitus with hyperglycemia: Secondary | ICD-10-CM | POA: Diagnosis not present

## 2014-06-02 DIAGNOSIS — I2699 Other pulmonary embolism without acute cor pulmonale: Secondary | ICD-10-CM | POA: Diagnosis not present

## 2014-06-02 DIAGNOSIS — I10 Essential (primary) hypertension: Secondary | ICD-10-CM | POA: Diagnosis not present

## 2014-06-02 DIAGNOSIS — M949 Disorder of cartilage, unspecified: Secondary | ICD-10-CM | POA: Diagnosis not present

## 2014-06-02 DIAGNOSIS — E663 Overweight: Secondary | ICD-10-CM | POA: Diagnosis not present

## 2014-06-14 NOTE — Progress Notes (Signed)
Patient ID: Carlos Herman, male   DOB: 02/11/1941, 73 y.o.   MRN: [id number] This is a 73-year-old male patient who is admitted to the hospital 10/11  with bilateral pulmonary embolus. He was treated with Apixaban 10 mg twice a day for 7 days decrease to 5 mg twice a day for at least 6 months. Her extremity Dopplers were negative for DVT. Echocardiogram revealed an EF of 55-60% with mild MR, mild L. left atrial enlargement.  The patient does have coronary artery disease status post CABG in 1996 with known occlusion of his grafts to the PDA and OM. His LIMA is atraumatic with the LAD filling retrograde from his SVG to the diagonal. The native RCA is patent with multiple stents. His last cath was in 2009. He was on chronic Plavix but this was stopped when anticoagulation started. His last Myoview in 2010 was low risk with small inferior infarct, mild peri-infarct ischemia EF 58%.   Seen by PA 04/26/14 for back pain  CT abdomen and pelvis ok  Having ongoing chest pain.  With exertion not clear if it is indigestion but nitro helps  No rest pain Been going on for a few weeks and now daily.  Has been taking his eliquis last dose last night    ROS: Denies fever, malais, weight loss, blurry vision, decreased visual acuity, cough, sputum, SOB, hemoptysis, pleuritic pain, palpitaitons, heartburn, abdominal pain, melena, lower extremity edema, claudication, or rash.  All other systems reviewed and negative  General: Affect appropriate Overweight white male  HEENT: normal Neck supple with no adenopathy JVP normal no bruits no thyromegaly Lungs clear with no wheezing and good diaphragmatic motion Heart:  S1/S2 no murmur, no rub, gallop or click PMI normal Abdomen: benighn, BS positve, no tenderness, no AAA no bruit.  No HSM or HJR Distal pulses intact with no bruits Plus one bilteral edema Neuro non-focal Skin warm and dry S/P right hip replacement    Current Outpatient Prescriptions  Medication  Sig Dispense Refill  . amLODipine (NORVASC) 10 MG tablet Take 1 tablet (10 mg total) by mouth at bedtime. 30 tablet 6  . apixaban (ELIQUIS) 5 MG TABS tablet Take 1 tablet (5 mg total) by mouth 2 (two) times daily. 60 tablet 6  . aspirin EC 81 MG tablet Take 81 mg by mouth daily.    . Calcium Carbonate-Vitamin D (CALCIUM 600 + D PO) Take 1 tablet by mouth 2 (two) times daily.     . HYDROcodone-acetaminophen (NORCO) 10-325 MG per tablet Take 1 tablet by mouth 2 (two) times daily.     . isosorbide mononitrate (IMDUR) 60 MG 24 hr tablet Take 60 mg by mouth at bedtime.    . Magnesium 250 MG TABS Take 250 mg by mouth at bedtime.    . metFORMIN (GLUCOPHAGE) 500 MG tablet Take 1,000 mg by mouth 2 (two) times daily with a meal.    . metoprolol succinate (TOPROL-XL) 100 MG 24 hr tablet Take 1 tablet (100 mg total) by mouth daily. Take with or immediately following a meal: 1 tablet (100 mg) every morning and 1/2 tablet (50 mg) every night 45 tablet 3  . OVER THE COUNTER MEDICATION Take 2 capsules by mouth 2 (two) times daily. Anti-oxidant with spirulina    . ramipril (ALTACE) 10 MG capsule Take 1 capsule (10 mg total) by mouth daily. 90 capsule 3   No current facility-administered medications for this visit.    Allergies  Penicillins and Statins  Electrocardiogram:   10/28  SR rate 76 PAC and PVC   Assessment and Plan  

## 2014-06-15 ENCOUNTER — Encounter: Payer: Self-pay | Admitting: Cardiovascular Disease

## 2014-06-15 ENCOUNTER — Ambulatory Visit (INDEPENDENT_AMBULATORY_CARE_PROVIDER_SITE_OTHER): Payer: Medicare Other | Admitting: Cardiovascular Disease

## 2014-06-15 ENCOUNTER — Other Ambulatory Visit: Payer: Self-pay | Admitting: Cardiovascular Disease

## 2014-06-15 VITALS — BP 132/68 | HR 77 | Ht 68.0 in | Wt 227.8 lb

## 2014-06-15 DIAGNOSIS — E785 Hyperlipidemia, unspecified: Secondary | ICD-10-CM

## 2014-06-15 DIAGNOSIS — I25709 Atherosclerosis of coronary artery bypass graft(s), unspecified, with unspecified angina pectoris: Secondary | ICD-10-CM

## 2014-06-15 DIAGNOSIS — I2510 Atherosclerotic heart disease of native coronary artery without angina pectoris: Secondary | ICD-10-CM

## 2014-06-15 DIAGNOSIS — I25118 Atherosclerotic heart disease of native coronary artery with other forms of angina pectoris: Secondary | ICD-10-CM

## 2014-06-15 DIAGNOSIS — I2699 Other pulmonary embolism without acute cor pulmonale: Secondary | ICD-10-CM

## 2014-06-15 DIAGNOSIS — Z01812 Encounter for preprocedural laboratory examination: Secondary | ICD-10-CM

## 2014-06-15 LAB — CBC WITH DIFFERENTIAL/PLATELET
Basophils Absolute: 0 10*3/uL (ref 0.0–0.1)
Basophils Relative: 0.5 % (ref 0.0–3.0)
EOS PCT: 1.6 % (ref 0.0–5.0)
Eosinophils Absolute: 0.2 10*3/uL (ref 0.0–0.7)
HCT: 40 % (ref 39.0–52.0)
Hemoglobin: 13 g/dL (ref 13.0–17.0)
LYMPHS PCT: 20.6 % (ref 12.0–46.0)
Lymphs Abs: 2.1 10*3/uL (ref 0.7–4.0)
MCHC: 32.6 g/dL (ref 30.0–36.0)
MCV: 95.5 fl (ref 78.0–100.0)
MONO ABS: 0.8 10*3/uL (ref 0.1–1.0)
MONOS PCT: 8.3 % (ref 3.0–12.0)
NEUTROS PCT: 69 % (ref 43.0–77.0)
Neutro Abs: 7 10*3/uL (ref 1.4–7.7)
Platelets: 263 10*3/uL (ref 150.0–400.0)
RBC: 4.19 Mil/uL — AB (ref 4.22–5.81)
RDW: 13 % (ref 11.5–15.5)
WBC: 10.1 10*3/uL (ref 4.0–10.5)

## 2014-06-15 LAB — BASIC METABOLIC PANEL
BUN: 20 mg/dL (ref 6–23)
CHLORIDE: 105 meq/L (ref 96–112)
CO2: 23 mEq/L (ref 19–32)
CREATININE: 0.9 mg/dL (ref 0.4–1.5)
Calcium: 9.1 mg/dL (ref 8.4–10.5)
GFR: 90.19 mL/min (ref >60.00)
Glucose, Bld: 135 mg/dL — ABNORMAL HIGH (ref 70–99)
Potassium: 4.3 mEq/L (ref 3.5–5.1)
Sodium: 138 mEq/L (ref 135–145)

## 2014-06-15 LAB — PROTIME-INR
INR: 1.1 ratio — ABNORMAL HIGH (ref 0.8–1.0)
PROTHROMBIN TIME: 12.4 s (ref 9.6–13.1)

## 2014-06-15 NOTE — Assessment & Plan Note (Signed)
Clinically unstable angina.  On max medical Rx Discussed options with patient favor cath tomorrow.  Discussed with pharmacist and Dr Veronda Prude.  No eliquis today and cath 3:00 tomorrow and should not need heparin bridge.  Try to cath from left radial.  Risks discussed with patient and willing to proceed.  Orders written lab called and discussed with CM

## 2014-06-15 NOTE — Assessment & Plan Note (Signed)
Cholesterol is at goal.  Continue current dose of statin and diet Rx.  No myalgias or side effects.  F/U  LFT's in 6 months. Lab Results  Component Value Date   LDLCALC * 08/03/2007    104        Total Cholesterol/HDL:CHD Risk Coronary Heart Disease Risk Table                     Men   Women  1/2 Average Risk   3.4   3.3             

## 2014-06-15 NOTE — Patient Instructions (Signed)
Your physician recommends that you continue on your current medications as directed. Please refer to the Current Medication list given to you today.  Lab Today: Bmet,Cbc, Pt/Inr  Your physician has requested that you have a cardiac catheterization. Cardiac catheterization is used to diagnose and/or treat various heart conditions. Doctors may recommend this procedure for a number of different reasons. The most common reason is to evaluate chest pain. Chest pain can be a symptom of coronary artery disease (CAD), and cardiac catheterization can show whether plaque is narrowing or blocking your heart's arteries. This procedure is also used to evaluate the valves, as well as measure the blood flow and oxygen levels in different parts of your heart. For further information please visit www.cardiosmart.org. Please follow instruction sheet, as given.   

## 2014-06-15 NOTE — Assessment & Plan Note (Signed)
On lower dose eliquis now  Recent CT with no pelvic vein DVT;s  Samples given since he is in donut hole.  Will need to be on triple Rx for short while if intervention required.  Was on ASA and eliquis prior to cath  Will get labs including PLT, INR , Cr and Hct pre cath

## 2014-06-16 ENCOUNTER — Ambulatory Visit (HOSPITAL_COMMUNITY)
Admission: RE | Admit: 2014-06-16 | Discharge: 2014-06-17 | Disposition: A | Discharge status: Home / Self Care | Payer: Medicare Other | Source: Ambulatory Visit | Attending: Cardiovascular Disease | Admitting: Cardiovascular Disease

## 2014-06-16 ENCOUNTER — Encounter (HOSPITAL_COMMUNITY)
Admission: RE | Disposition: A | Discharge status: Home / Self Care | Payer: Self-pay | Source: Ambulatory Visit | Attending: Cardiovascular Disease

## 2014-06-16 ENCOUNTER — Encounter (HOSPITAL_COMMUNITY): Payer: Self-pay | Admitting: Cardiovascular Disease

## 2014-06-16 DIAGNOSIS — T82858A Stenosis of vascular prosthetic devices, implants and grafts, initial encounter: Secondary | ICD-10-CM | POA: Diagnosis not present

## 2014-06-16 DIAGNOSIS — Z880 Allergy status to penicillin: Secondary | ICD-10-CM | POA: Insufficient documentation

## 2014-06-16 DIAGNOSIS — I2782 Chronic pulmonary embolism: Secondary | ICD-10-CM | POA: Insufficient documentation

## 2014-06-16 DIAGNOSIS — Z86711 Personal history of pulmonary embolism: Secondary | ICD-10-CM | POA: Insufficient documentation

## 2014-06-16 DIAGNOSIS — I2699 Other pulmonary embolism without acute cor pulmonale: Secondary | ICD-10-CM | POA: Diagnosis present

## 2014-06-16 DIAGNOSIS — I25710 Atherosclerosis of autologous vein coronary artery bypass graft(s) with unstable angina pectoris: Secondary | ICD-10-CM | POA: Insufficient documentation

## 2014-06-16 DIAGNOSIS — Z7901 Long term (current) use of anticoagulants: Secondary | ICD-10-CM

## 2014-06-16 DIAGNOSIS — I10 Essential (primary) hypertension: Secondary | ICD-10-CM | POA: Diagnosis not present

## 2014-06-16 DIAGNOSIS — Z7902 Long term (current) use of antithrombotics/antiplatelets: Secondary | ICD-10-CM | POA: Diagnosis not present

## 2014-06-16 DIAGNOSIS — E119 Type 2 diabetes mellitus without complications: Secondary | ICD-10-CM | POA: Diagnosis not present

## 2014-06-16 DIAGNOSIS — I25110 Atherosclerotic heart disease of native coronary artery with unstable angina pectoris: Secondary | ICD-10-CM | POA: Insufficient documentation

## 2014-06-16 DIAGNOSIS — Z7982 Long term (current) use of aspirin: Secondary | ICD-10-CM | POA: Diagnosis not present

## 2014-06-16 DIAGNOSIS — Z951 Presence of aortocoronary bypass graft: Secondary | ICD-10-CM | POA: Diagnosis not present

## 2014-06-16 DIAGNOSIS — I200 Unstable angina: Secondary | ICD-10-CM | POA: Diagnosis present

## 2014-06-16 DIAGNOSIS — I2510 Atherosclerotic heart disease of native coronary artery without angina pectoris: Secondary | ICD-10-CM

## 2014-06-16 HISTORY — PX: PERCUTANEOUS CORONARY STENT INTERVENTION (PCI-S): SHX5485

## 2014-06-16 HISTORY — PX: LEFT HEART CATHETERIZATION WITH CORONARY/GRAFT ANGIOGRAM: SHX5450

## 2014-06-16 LAB — POCT ACTIVATED CLOTTING TIME
ACTIVATED CLOTTING TIME: 220 s
Activated Clotting Time: 251 seconds

## 2014-06-16 LAB — MRSA PCR SCREENING: MRSA by PCR: NEGATIVE

## 2014-06-16 SURGERY — LEFT HEART CATHETERIZATION WITH CORONARY/GRAFT ANGIOGRAM
Anesthesia: LOCAL

## 2014-06-16 MED ORDER — SODIUM CHLORIDE 0.9 % IV SOLN: 250.0000 mL | INTRAVENOUS | Status: DC | PRN

## 2014-06-16 MED ORDER — RAMIPRIL 10 MG PO CAPS
10.0000 mg | ORAL_CAPSULE | Freq: Every day | ORAL | Status: DC
  Administered 2014-06-17: 10 mg via ORAL
  Filled 2014-06-16: qty 1

## 2014-06-16 MED ORDER — NITROGLYCERIN 1 MG/10 ML FOR IR/CATH LAB
INTRA_ARTERIAL | Status: AC
  Filled 2014-06-16: qty 10

## 2014-06-16 MED ORDER — METOPROLOL SUCCINATE ER 100 MG PO TB24
100.0000 mg | ORAL_TABLET | Freq: Every day | ORAL | Status: DC
  Administered 2014-06-17: 100 mg via ORAL
  Filled 2014-06-16 (×2): qty 1

## 2014-06-16 MED ORDER — ASPIRIN 81 MG PO CHEW: 81.0000 mg | CHEWABLE_TABLET | ORAL | Status: DC

## 2014-06-16 MED ORDER — MIDAZOLAM HCL 2 MG/2ML IJ SOLN
INTRAMUSCULAR | Status: AC
  Filled 2014-06-16: qty 2

## 2014-06-16 MED ORDER — HEPARIN SODIUM (PORCINE) 1000 UNIT/ML IJ SOLN
INTRAMUSCULAR | Status: AC
  Filled 2014-06-16: qty 1

## 2014-06-16 MED ORDER — CLOPIDOGREL BISULFATE 75 MG PO TABS
75.0000 mg | ORAL_TABLET | Freq: Every day | ORAL | Status: DC
  Administered 2014-06-17: 75 mg via ORAL
  Filled 2014-06-16: qty 1

## 2014-06-16 MED ORDER — AMLODIPINE BESYLATE 10 MG PO TABS
10.0000 mg | ORAL_TABLET | Freq: Every day | ORAL | Status: DC
  Filled 2014-06-16: qty 1

## 2014-06-16 MED ORDER — FENTANYL CITRATE 0.05 MG/ML IJ SOLN
INTRAMUSCULAR | Status: AC
  Filled 2014-06-16: qty 2

## 2014-06-16 MED ORDER — SODIUM CHLORIDE 0.9 % IJ SOLN: 3.0000 mL | INTRAMUSCULAR | Status: DC | PRN

## 2014-06-16 MED ORDER — SODIUM CHLORIDE 0.9 % IV SOLN
INTRAVENOUS | Status: DC
  Administered 2014-06-16: 13:00:00 via INTRAVENOUS

## 2014-06-16 MED ORDER — ONDANSETRON HCL 4 MG/2ML IJ SOLN: 4.0000 mg | Freq: Four times a day (QID) | INTRAMUSCULAR | Status: DC | PRN

## 2014-06-16 MED ORDER — LIDOCAINE HCL (PF) 1 % IJ SOLN
INTRAMUSCULAR | Status: AC
  Filled 2014-06-16: qty 30

## 2014-06-16 MED ORDER — VERAPAMIL HCL 2.5 MG/ML IV SOLN
INTRAVENOUS | Status: AC
  Filled 2014-06-16: qty 2

## 2014-06-16 MED ORDER — ASPIRIN EC 81 MG PO TBEC
81.0000 mg | DELAYED_RELEASE_TABLET | Freq: Every day | ORAL | Status: DC
  Administered 2014-06-17: 81 mg via ORAL
  Filled 2014-06-16: qty 1

## 2014-06-16 MED ORDER — HEPARIN (PORCINE) IN NACL 2-0.9 UNIT/ML-% IJ SOLN
INTRAMUSCULAR | Status: AC
  Filled 2014-06-16: qty 1000

## 2014-06-16 MED ORDER — ISOSORBIDE MONONITRATE ER 60 MG PO TB24
60.0000 mg | ORAL_TABLET | Freq: Every day | ORAL | Status: DC
  Administered 2014-06-16: 60 mg via ORAL
  Filled 2014-06-16 (×2): qty 1

## 2014-06-16 MED ORDER — ACETAMINOPHEN 325 MG PO TABS: 650.0000 mg | ORAL_TABLET | ORAL | Status: DC | PRN

## 2014-06-16 MED ORDER — SODIUM CHLORIDE 0.9 % IV SOLN: INTRAVENOUS | Status: AC

## 2014-06-16 MED ORDER — HEPARIN (PORCINE) IN NACL 100-0.45 UNIT/ML-% IJ SOLN
1300.0000 [IU]/h | INTRAMUSCULAR | Status: DC
  Administered 2014-06-16: 1300 [IU]/h via INTRAVENOUS
  Filled 2014-06-16 (×2): qty 250

## 2014-06-16 MED ORDER — OXYCODONE-ACETAMINOPHEN 5-325 MG PO TABS
1.0000 | ORAL_TABLET | ORAL | Status: DC | PRN
  Administered 2014-06-16: 2 via ORAL
  Filled 2014-06-16: qty 2

## 2014-06-16 MED ORDER — CLOPIDOGREL BISULFATE 300 MG PO TABS
ORAL_TABLET | ORAL | Status: AC
  Filled 2014-06-16: qty 2

## 2014-06-16 MED ORDER — AMLODIPINE BESYLATE 10 MG PO TABS
10.0000 mg | ORAL_TABLET | Freq: Every day | ORAL | Status: DC
  Administered 2014-06-16: 10 mg via ORAL
  Filled 2014-06-16: qty 1

## 2014-06-16 MED ORDER — ASPIRIN EC 81 MG PO TBEC
81.0000 mg | DELAYED_RELEASE_TABLET | Freq: Every day | ORAL | Status: DC
  Filled 2014-06-16: qty 1

## 2014-06-16 MED ORDER — SODIUM CHLORIDE 0.9 % IJ SOLN: 3.0000 mL | Freq: Two times a day (BID) | INTRAMUSCULAR | Status: DC

## 2014-06-16 NOTE — Interval H&P Note (Signed)
History and Physical Interval Note:  06/16/2014 12:31 PM  Gwynn Burly  has presented today for cardiac cath with the diagnosis of chest pain, known CAD.  The various methods of treatment have been discussed with the patient and family. After consideration of risks, benefits and other options for treatment, the patient has consented to  Procedure(s): LEFT HEART CATHETERIZATION WITH CORONARY/GRAFT ANGIOGRAM (N/A) as a surgical intervention .  The patient's history has been reviewed, patient examined, no change in status, stable for surgery.  I have reviewed the patient's chart and labs.  Questions were answered to the patient's satisfaction.    Cath Lab Visit (complete for each Cath Lab visit)  Clinical Evaluation Leading to the Procedure:   ACS: No.  Non-ACS:    Anginal Classification: CCS III  Anti-ischemic medical therapy: Maximal Therapy (2 or more classes of medications)  Non-Invasive Test Results: No non-invasive testing performed  Prior CABG: Previous CABG        MCALHANY,CHRISTOPHER

## 2014-06-16 NOTE — Progress Notes (Signed)
3 ml air out of TR band, ooz noted around site. 3 ml back into TR band will re-attempt as per protocol.

## 2014-06-16 NOTE — H&P (View-Only) (Signed)
Patient ID: Carlos Herman, male   DOB: 1941-03-21, 73 y.o.   MRN: [id number] This is a 73 year old male patient who is admitted to the hospital 10/11  with bilateral pulmonary embolus. He was treated with Apixaban 10 mg twice a day for 7 days decrease to 5 mg twice a day for at least 6 months. Her extremity Dopplers were negative for DVT. Echocardiogram revealed an EF of 55-60% with mild MR, mild L. left atrial enlargement.  The patient does have coronary artery disease status post CABG in 1996 with known occlusion of his grafts to the PDA and OM. His LIMA is atraumatic with the LAD filling retrograde from his SVG to the diagonal. The native RCA is patent with multiple stents. His last cath was in 2009. He was on chronic Plavix but this was stopped when anticoagulation started. His last Myoview in 2010 was low risk with small inferior infarct, mild peri-infarct ischemia EF 58%.   Seen by PA 04/26/14 for back pain  CT abdomen and pelvis ok  Having ongoing chest pain.  With exertion not clear if it is indigestion but nitro helps  No rest pain Been going on for a few weeks and now daily.  Has been taking his eliquis last dose last night    ROS: Denies fever, malais, weight loss, blurry vision, decreased visual acuity, cough, sputum, SOB, hemoptysis, pleuritic pain, palpitaitons, heartburn, abdominal pain, melena, lower extremity edema, claudication, or rash.  All other systems reviewed and negative  General: Affect appropriate Overweight white male  HEENT: normal Neck supple with no adenopathy JVP normal no bruits no thyromegaly Lungs clear with no wheezing and good diaphragmatic motion Heart:  S1/S2 no murmur, no rub, gallop or click PMI normal Abdomen: benighn, BS positve, no tenderness, no AAA no bruit.  No HSM or HJR Distal pulses intact with no bruits Plus one bilteral edema Neuro non-focal Skin warm and dry S/P right hip replacement    Current Outpatient Prescriptions  Medication  Sig Dispense Refill  . amLODipine (NORVASC) 10 MG tablet Take 1 tablet (10 mg total) by mouth at bedtime. 30 tablet 6  . apixaban (ELIQUIS) 5 MG TABS tablet Take 1 tablet (5 mg total) by mouth 2 (two) times daily. 60 tablet 6  . aspirin EC 81 MG tablet Take 81 mg by mouth daily.    . Calcium Carbonate-Vitamin D (CALCIUM 600 + D PO) Take 1 tablet by mouth 2 (two) times daily.     Marland Kitchen HYDROcodone-acetaminophen (NORCO) 10-325 MG per tablet Take 1 tablet by mouth 2 (two) times daily.     . isosorbide mononitrate (IMDUR) 60 MG 24 hr tablet Take 60 mg by mouth at bedtime.    . Magnesium 250 MG TABS Take 250 mg by mouth at bedtime.    . metFORMIN (GLUCOPHAGE) 500 MG tablet Take 1,000 mg by mouth 2 (two) times daily with a meal.    . metoprolol succinate (TOPROL-XL) 100 MG 24 hr tablet Take 1 tablet (100 mg total) by mouth daily. Take with or immediately following a meal: 1 tablet (100 mg) every morning and 1/2 tablet (50 mg) every night 45 tablet 3  . OVER THE COUNTER MEDICATION Take 2 capsules by mouth 2 (two) times daily. Anti-oxidant with spirulina    . ramipril (ALTACE) 10 MG capsule Take 1 capsule (10 mg total) by mouth daily. 90 capsule 3   No current facility-administered medications for this visit.    Allergies  Penicillins and Statins  Electrocardiogram:  10/28  SR rate 76 PAC and PVC   Assessment and Plan

## 2014-06-16 NOTE — Progress Notes (Signed)
Utilization Review Completed.Carlos Herman T12/18/2015  

## 2014-06-16 NOTE — CV Procedure (Signed)
Cardiac Catheterization Operative Report  Carlos Herman [id number] 12/18/20151:25 PM Kandice Hams, MD  Procedure Performed:  1. Left Heart Catheterization 2. Selective Coronary Angiography 3. SVG angiography 4. Left ventricular angiogram 5. Aortic root angiogram 6. PTCA/DES x 1 mid RCA 7. PTCA/cutting balloon angioplasty distal RCA  Operator: Lauree Chandler, MD  Arterial access site:  Right radial artery.   Indication:  73 yo male with history of CAD s/p CABG in 1996, DM, HTN, HLD with recent c/o chest pain with exertion concerning for unstable angina. Last cath in 2009 with patent RCA with diffuse stenting, occluded Circumflex and LAD with patent SVG to Diagonal filling LAD but occluded SVG to OM and SVG to RCA. He has been on anti-coagulation with Eliquis for recent PE October 2015. He was seen in the office yesterday by Dr. Johnsie Cancel and added to the cath schedule today.                                     Procedure Details: The risks, benefits, complications, treatment options, and expected outcomes were discussed with the patient. The patient and/or family concurred with the proposed plan, giving informed consent. The patient was brought to the cath lab after IV hydration was begun and oral premedication was given. The patient was further sedated with Versed and Fentanyl. The right wrist was assessed with a modified Allens test which was positive. The right wrist was prepped and draped in a sterile fashion. 1% lidocaine was used for local anesthesia. Using the modified Seldinger access technique, a 5 French sheath was placed in the right radial artery. 3 mg Verapamil was given through the sheath. 5000 units IV heparin was given. Standard diagnostic catheters were used to perform selective coronary angiography. I was able to engage the RCA with a JR4 catheter but I could not selectively engage the left main artery from the right radial approach. A pigtail catheter was  used to perform a left ventricular angiogram and an aortic root angiogram. I was able to visualize a very small segment of the Circumflex filling from the left main (Review of old films from 2009 show that the LAD is occluded proximally and the Circumflex was small with high grade, diffuse disease into a small OM branch, not amenable to PCI due to size). He was found to have high grade stenosis in the mid RCA stented segment with moderate disease in the short unstented segment of the mid RCA. He was also found to have severe restenosis in the distal stented segment. I elected to proceed to PCI of the RCA.   PCI Note: The RCA was engaged with a XB LAD 3.5 guiding catheter while trying to engage the left main so I left this guide in place and began the PCI. He was given an additional 5000 units of IV heparin. ACT was over 200. He was given Plavix 600 mg po x 1.  Lesion # 1 (mid RCA):  I advanced a Cougar IC wire down the RCA. A 3.0 x 10 mm cutting balloon was inflated x 2 in the mid RCA in the area of stent restenosis. There was a short segment between stents in the mid vessel that was unstented. I carefully positioned and deployed a 3.0 x 20 mm Promus Premier DES in the mid RCA. The stent was post-dilated with a 3.25 x 15 mm [place] balloon x 2. The stenosis  was taken from 80% down to 0%.   Lesion #2: (distal RCA): I advanced a 2.5 x 10 mm cutting balloon into the distal vessel and inflated the cutting balloon x 2. The stenosis was taken from 80% down to 10%.   The sheath was removed from the right radial artery and a Terumo hemostasis band was applied at the arteriotomy site on the right wrist. There were no immediate complications. The patient was taken to the recovery area in stable condition.   Hemodynamic Findings: Central aortic pressure: 122/54 Left ventricular pressure: 115/14/20  Angiographic Findings:  Left main: Not selectively engaged.    Left Anterior Descending Artery: Large caliber vessel  that courses to the apex. The proximal vessel is known to be occluded. Not selectively engaged today. The mid and distal vessel fills from the patent SVG to the diagonal.    Circumflex Artery: Not selectively engaged. This vessel is known to be a diffusely diseased, small vessel and not favorable for PCI during last cath.    Right Coronary Artery: Large caliber dominant vessel with patent stents in the proximal, mid and distal vessel. The proximal stented segment is patent with no restenosis. Just beyond the stented segment is a short area with no stent. This segment has 40% stenosis. The mid vessel has a stented segment with 80% restenosis on the front edge of the stent. The remainder of the mid stented segment is patent with diffuse 20% restenosis. The distal stented segment has a focal 80% restenosis. The PDA is patent. There is a small caliber posterolateral branch. The RV marginal branch has 99% stenosis.   Graft Anatomy:  SVG to OM is known to be occluded and is not selectively engaged.  SVG to RCA is known to be occluded and is not selectively engaged SVG to Diagonal is patent filling the LAD, however, the distal segment of the diagonal branch is now occluded.  LIMA to LAD is known to be small and atretic and is not selectively engaged.   Left Ventricular Angiogram: LVEF=55-60%  Impression: 1. Severe triple vessel CAD s/p 4 V CABG with 1/4 patent grafts 2. Progression of disease in the RCA with restenosis in the mid stented segment and distal stented segment 3. Progression of disease in the target of the SVG to Diagonal. This graft still supplies the LAD but the distal segment of the diagonal beyond graft insertion is now occluded.  4. Successful PTCA/DES x 1 mid RCA 5. Successful PTCA/cutting balloon angioplasty distal RCA  Recommendations: Will need Plavix for at least one year. Since he is also on Eliquis, would not continue ASA. Would restart Eliquis in am.   Of note, would not  proceed with cath from right radial in the future. I was unable to engage the left main from this approach. I needed to use the radial approach today since he had received Eliquis yesterday. I do not think there were any options for PCI of the Circumflex based on old films so I did not attempt groin access today.        Complications:  None. The patient tolerated the procedure well.

## 2014-06-16 NOTE — Progress Notes (Signed)
ANTICOAGULATION CONSULT NOTE - Initial Consult  Pharmacy Consult for Heparin Indication: Hx of bilateral pulmonary emboli  Allergies  Allergen Reactions  . Penicillins Swelling and Other (See Comments)    redness  . Statins Other (See Comments)      muscle pain    Patient Measurements: Height: 5\' 9"  (175.3 cm) Weight: 227 lb (102.967 kg) IBW/kg (Calculated) : 70.7 Heparin Dosing Weight: 93 kg  Vital Signs: Temp: 97.9 F (36.6 C) (12/18 1206) BP: 134/66 mmHg (12/18 1206) Pulse Rate: 59 (12/18 1333)  Labs:  Recent Labs  06/15/14 0830  HGB 13.0  HCT 40.0  PLT 263.0  LABPROT 12.4  INR 1.1*  CREATININE 0.9    Estimated Creatinine Clearance: 86.4 mL/min (by C-G formula based on Cr of 0.9).   Medical History: Past Medical History  Diagnosis Date  . Depression   . Osteoarthritis   . Stroke \  . Dyslipidemia   . DM (diabetes mellitus)   . Nephrolithiasis   . Obesity     morbid  . Leg edema     right  . Hypertension   . CAD (coronary artery disease)   . Hemorrhoids     internal    Medications:  Eliquis PTA  Assessment: 73 year old male status post left heart catheterization to restart heparin 8 hours post sheath removal with plans to resume Eliquis in AM for history of bilateral pulmonary emboli. Sheath removed at 15:06 PM and TR band applied.   Anticoagulation: Baseline INR 1.1. H/H within normal limits. Platelets are 263. SCr 0.9. Per RN patient with oozing from TR band initially but now resolved.   Goal of Therapy:  Heparin level 0.3-0.7 units/ml Monitor platelets by anticoagulation protocol: Yes   Plan:  Heparin drip at 1300 units/hr at 23:00 PM.  Follow-up a 6 hours heparin level or restart of Eliquis.   Sloan Leiter, PharmD, BCPS Clinical Pharmacist [phone] 06/16/2014,3:58 PM

## 2014-06-17 DIAGNOSIS — I200 Unstable angina: Secondary | ICD-10-CM

## 2014-06-17 DIAGNOSIS — I2699 Other pulmonary embolism without acute cor pulmonale: Secondary | ICD-10-CM

## 2014-06-17 DIAGNOSIS — I25700 Atherosclerosis of coronary artery bypass graft(s), unspecified, with unstable angina pectoris: Secondary | ICD-10-CM

## 2014-06-17 DIAGNOSIS — Z7982 Long term (current) use of aspirin: Secondary | ICD-10-CM | POA: Diagnosis not present

## 2014-06-17 DIAGNOSIS — Z951 Presence of aortocoronary bypass graft: Secondary | ICD-10-CM | POA: Diagnosis not present

## 2014-06-17 DIAGNOSIS — Z86711 Personal history of pulmonary embolism: Secondary | ICD-10-CM | POA: Diagnosis not present

## 2014-06-17 DIAGNOSIS — T82858A Stenosis of vascular prosthetic devices, implants and grafts, initial encounter: Secondary | ICD-10-CM | POA: Diagnosis not present

## 2014-06-17 LAB — BASIC METABOLIC PANEL
Anion gap: 14 (ref 5–15)
BUN: 13 mg/dL (ref 6–23)
CALCIUM: 9.1 mg/dL (ref 8.4–10.5)
CO2: 23 mEq/L (ref 19–32)
CREATININE: 0.84 mg/dL (ref 0.50–1.35)
Chloride: 101 mEq/L (ref 96–112)
GFR, EST NON AFRICAN AMERICAN: 85 mL/min — AB (ref >90)
GLUCOSE: 118 mg/dL — AB (ref 70–99)
Potassium: 4.2 mEq/L (ref 3.7–5.3)
Sodium: 138 mEq/L (ref 137–147)

## 2014-06-17 LAB — CBC
HCT: 38.8 % — ABNORMAL LOW (ref 39.0–52.0)
Hemoglobin: 13 g/dL (ref 13.0–17.0)
MCH: 31.8 pg (ref 26.0–34.0)
MCHC: 33.5 g/dL (ref 30.0–36.0)
MCV: 94.9 fL (ref 78.0–100.0)
PLATELETS: 234 10*3/uL (ref 150–400)
RBC: 4.09 MIL/uL — ABNORMAL LOW (ref 4.22–5.81)
RDW: 12.3 % (ref 11.5–15.5)
WBC: 7.4 10*3/uL (ref 4.0–10.5)

## 2014-06-17 LAB — APTT: aPTT: 47 seconds — ABNORMAL HIGH (ref 24–37)

## 2014-06-17 LAB — HEPARIN LEVEL (UNFRACTIONATED): HEPARIN UNFRACTIONATED: 0.8 [IU]/mL — AB (ref 0.30–0.70)

## 2014-06-17 MED ORDER — CLOPIDOGREL BISULFATE 75 MG PO TABS: 75.0000 mg | ORAL_TABLET | Freq: Every day | ORAL | Status: DC

## 2014-06-17 MED ORDER — METFORMIN HCL 500 MG PO TABS: 1000.0000 mg | ORAL_TABLET | Freq: Two times a day (BID) | ORAL | Status: DC

## 2014-06-17 MED ORDER — ACETAMINOPHEN 325 MG PO TABS: 650.0000 mg | ORAL_TABLET | ORAL | Status: DC | PRN

## 2014-06-17 MED ORDER — APIXABAN 5 MG PO TABS
5.0000 mg | ORAL_TABLET | Freq: Two times a day (BID) | ORAL | Status: DC
  Administered 2014-06-17: 5 mg via ORAL
  Filled 2014-06-17 (×2): qty 1

## 2014-06-17 NOTE — Discharge Instructions (Addendum)
Information on my medicine - ELIQUIS (apixaban)  This medication education was reviewed with me or my healthcare representative as part of my discharge preparation.  The pharmacist that spoke with me during my hospital stay was:  Adora Fridge, [facility]  Why was Eliquis prescribed for you? Eliquis was prescribed to treat blood clots that may have been found in the veins of your legs (deep vein thrombosis) or in your lungs (pulmonary embolism) and to reduce the risk of them occurring again.  What do You need to know about Eliquis ? The starting dose is 10 mg (two 5 mg tablets) taken TWICE daily for the FIRST SEVEN (7) DAYS, then the dose is reduced to ONE 5 mg tablet taken TWICE daily.  Eliquis may be taken with or without food.   Try to take the dose about the same time in the morning and in the evening. If you have difficulty swallowing the tablet whole please discuss with your pharmacist how to take the medication safely.  Take Eliquis exactly as prescribed and DO NOT stop taking Eliquis without talking to the doctor who prescribed the medication.  Stopping may increase your risk of developing a new blood clot.  Refill your prescription before you run out.  After discharge, you should have regular check-up appointments with your healthcare provider that is prescribing your Eliquis.    What do you do if you miss a dose? If a dose of ELIQUIS is not taken at the scheduled time, take it as soon as possible on the same day and twice-daily administration should be resumed. The dose should not be doubled to make up for a missed dose.  Important Safety Information A possible side effect of Eliquis is bleeding. You should call your healthcare provider right away if you experience any of the following: ? Bleeding from an injury or your nose that does not stop. ? Unusual colored urine (red or dark brown) or unusual colored stools (red or black). ? Unusual bruising for unknown reasons. ? A  serious fall or if you hit your head (even if there is no bleeding).  Some medicines may interact with Eliquis and might increase your risk of bleeding or clotting while on Eliquis. To help avoid this, consult your healthcare provider or pharmacist prior to using any new prescription or non-prescription medications, including herbals, vitamins, non-steroidal anti-inflammatory drugs (NSAIDs) and supplements.  This website has more information on Eliquis (apixaban): http://www.eliquis.com/eliquis/home  Coronary Angiogram With Stent, Care After Refer to this sheet in the next few weeks. These instructions provide you with information on caring for yourself after your procedure. Your health care provider may also give you more specific instructions. Your treatment has been planned according to current medical practices, but problems sometimes occur. Call your health care provider if you have any problems or questions after your procedure.  WHAT TO EXPECT AFTER THE PROCEDURE  The insertion site may be tender for a few days after your procedure. HOME CARE INSTRUCTIONS   Take medicines only as directed by your health care provider. Blood thinners may be prescribed after your procedure to improve blood flow through the stent.  Change any bandages (dressings) as directed by your health care provider.   Check your insertion site every day for redness, swelling, or fluid leaking from the insertion.   Do not take baths, swim, or use a hot tub until your health care provider approves. You may shower. Pat the insertion area dry. Do not rub the insertion area  with a washcloth or towel.   Eat a heart-healthy diet. This should include plenty of fresh fruits and vegetables. Meat should be lean cuts. Avoid the following types of food:   Food that is high in salt.   Canned or highly processed food.   Food that is high in saturated fat or sugar.   Fried food.   Make any other lifestyle changes  recommended by your health care provider. This may include:   Not using any tobacco products including cigarettes, chewing tobacco, or electronic cigarettes.  Managing your weight.   Getting regular exercise.   Managing your blood pressure.   Limiting your alcohol intake.   Managing other health problems, such as diabetes.   If you need an MRI after your heart stent was placed, be sure to tell the health care provider who orders the MRI that you have a heart stent.   Keep all follow-up visits as directed by your health care provider.  SEEK IMMEDIATE MEDICAL CARE IF:   You develop chest pain, shortness of breath, feel faint, or pass out.  You have bleeding, swelling larger than a walnut, or drainage from the catheter insertion site.  You develop pain, discoloration, coldness, or severe bruising in the leg or arm that held the catheter.  You develop bleeding from any other place such as from the bowels. There may be bright red blood in the urine or stools, or it may appear as black, tarry stools.  You have a fever or chills. MAKE SURE YOU:  Understand these instructions.  Will watch your condition.  Will get help right away if you are not doing well or get worse. Document Released: 01/03/2005 Document Revised: 10/31/2013 Document Reviewed: 11/17/2012 [facility] Patient Information 2015 [place], [place]. This information is not intended to replace advice given to you by your health care provider. Make sure you discuss any questions you have with your health care provider.

## 2014-06-17 NOTE — Progress Notes (Signed)
ANTICOAGULATION CONSULT NOTE - Follow Up Consult  Pharmacy Consult for heparin Indication: h/o PE  Labs:  Recent Labs  06/15/14 0830 06/17/14 0300  HGB 13.0 13.0  HCT 40.0 38.8*  PLT 263.0 234  APTT  --  47*  LABPROT 12.4  --   INR 1.1*  --   HEPARINUNFRC  --  0.80*  CREATININE 0.9 0.84     Assessment/Plan:  73yo male slightly supratherapeutic on heparin using heparin level but below goal using PTT. Will continue gtt at current rate with current plan to resume Eliquis today.   Wynona Neat, PharmD, BCPS  06/17/2014,6:15 AM

## 2014-06-17 NOTE — Progress Notes (Signed)
CARDIAC REHAB PHASE I   PRE:  Rate/Rhythm:   BP:  Supine:  Sitting:   Standing:    SaO2:   MODE:  Ambulation:  ft   POST:  Rate/Rhythm:   BP:  Supine:   Sitting:   Standing:    SaO2:  Patient refused ambulation and education.  He just wants to go home.  He is upset about being put on Plavix without being told.  Refused phase II cardiac rehab.  He was quizzed re: angina symptoms and NTG usage.  I urged the importance of taking the Plavix and Eliquis to keep his stent open.  Given stent booklet and card. Discussed cath site care and activity restrictions with right hand.   Unwilling to cooperate.   [phone] Liliane Channel RN, BSN 06/17/2014 12:31 PM

## 2014-06-17 NOTE — Progress Notes (Signed)
DAILY PROGRESS NOTE  Subjective:  No chest pain overnight. On IV heparin for history of blood clots. Successful PCI to the RCA yesterday with DES placement.  Objective:  Temp:  [97.3 F (36.3 C)-98 F (36.7 C)] 98 F (36.7 C) (12/19 0748) Pulse Rate:  [36-96] 55 (12/19 0748) Resp:  [10-23] 18 (12/19 0748) BP: (119-158)/(45-107) 126/58 mmHg (12/19 0748) SpO2:  [93 %-100 %] 93 % (12/19 0748) Weight:  [227 lb (102.967 kg)] 227 lb (102.967 kg) (12/18 1206) Weight change:   Intake/Output from previous day: 12/18 0701 - 12/19 0700 In: 980.7 [P.O.:480; I.V.:500.7] Out: -   Intake/Output from this shift: Total I/O In: 292 [P.O.:240; I.V.:52] Out: -   Medications: Current Facility-Administered Medications  Medication Dose Route Frequency Provider Last Rate Last Dose  . acetaminophen (TYLENOL) tablet 650 mg  650 mg Oral Q4H PRN Burnell Blanks, MD      . amLODipine (NORVASC) tablet 10 mg  10 mg Oral QHS Lamar Sprinkles, MD   10 mg at 06/16/14 2339  . aspirin EC tablet 81 mg  81 mg Oral Daily Burnell Blanks, MD   81 mg at 06/17/14 1004  . clopidogrel (PLAVIX) tablet 75 mg  75 mg Oral Q breakfast Burnell Blanks, MD   75 mg at 06/17/14 0800  . heparin ADULT infusion 100 units/mL (25000 units/250 mL)  1,300 Units/hr Intravenous Continuous Brain Hilts, RPH 13 mL/hr at 06/17/14 1000 1,300 Units/hr at 06/17/14 1000  . isosorbide mononitrate (IMDUR) 24 hr tablet 60 mg  60 mg Oral QHS Burnell Blanks, MD   60 mg at 06/16/14 2259  . metoprolol succinate (TOPROL-XL) 24 hr tablet 100 mg  100 mg Oral Daily Burnell Blanks, MD   100 mg at 06/17/14 1004  . ondansetron (ZOFRAN) injection 4 mg  4 mg Intravenous Q6H PRN Burnell Blanks, MD      . oxyCODONE-acetaminophen (PERCOCET/ROXICET) 5-325 MG per tablet 1-2 tablet  1-2 tablet Oral Q4H PRN Burnell Blanks, MD   2 tablet at 06/16/14 2326  . ramipril (ALTACE) capsule 10 mg  10 mg Oral  Daily Burnell Blanks, MD   10 mg at 06/17/14 1004    Physical Exam: General appearance: alert and no distress Lungs: clear to auscultation bilaterally Heart: regular rate and rhythm, S1, S2 normal, no murmur, click, rub or gallop Extremities: extremities normal, atraumatic, no cyanosis or edema and radial cath site intact  Lab Results: Results for orders placed or performed during the hospital encounter of 06/16/14 (from the past 48 hour(s))  POCT Activated clotting time     Status: None   Collection Time: 06/16/14  2:08 PM  Result Value Ref Range   Activated Clotting Time 220 seconds  POCT Activated clotting time     Status: None   Collection Time: 06/16/14  2:28 PM  Result Value Ref Range   Activated Clotting Time 251 seconds  MRSA PCR Screening     Status: None   Collection Time: 06/16/14  3:28 PM  Result Value Ref Range   MRSA by PCR NEGATIVE NEGATIVE    Comment:        The GeneXpert MRSA Assay (FDA approved for NASAL specimens only), is one component of a comprehensive MRSA colonization surveillance program. It is not intended to diagnose MRSA infection nor to guide or monitor treatment for MRSA infections.   Basic metabolic panel     Status: Abnormal   Collection Time: 06/17/14  3:00  AM  Result Value Ref Range   Sodium 138 137 - 147 mEq/L   Potassium 4.2 3.7 - 5.3 mEq/L   Chloride 101 96 - 112 mEq/L   CO2 23 19 - 32 mEq/L   Glucose, Bld 118 (H) 70 - 99 mg/dL   BUN 13 6 - 23 mg/dL   Creatinine, Ser 0.84 0.50 - 1.35 mg/dL   Calcium 9.1 8.4 - 10.5 mg/dL   GFR calc non Af Amer 85 (L) >90 mL/min   GFR calc Af Amer >90 >90 mL/min    Comment: (NOTE) The eGFR has been calculated using the CKD EPI equation. This calculation has not been validated in all clinical situations. eGFR's persistently <90 mL/min signify possible Chronic Kidney Disease.    Anion gap 14 5 - 15  CBC     Status: Abnormal   Collection Time: 06/17/14  3:00 AM  Result Value Ref Range    WBC 7.4 4.0 - 10.5 K/uL   RBC 4.09 (L) 4.22 - 5.81 MIL/uL   Hemoglobin 13.0 13.0 - 17.0 g/dL   HCT 38.8 (L) 39.0 - 52.0 %   MCV 94.9 78.0 - 100.0 fL   MCH 31.8 26.0 - 34.0 pg   MCHC 33.5 30.0 - 36.0 g/dL   RDW 12.3 11.5 - 15.5 %   Platelets 234 150 - 400 K/uL  Heparin level (unfractionated)     Status: Abnormal   Collection Time: 06/17/14  3:00 AM  Result Value Ref Range   Heparin Unfractionated 0.80 (H) 0.30 - 0.70 IU/mL    Comment:        IF HEPARIN RESULTS ARE BELOW EXPECTED VALUES, AND PATIENT DOSAGE HAS BEEN CONFIRMED, SUGGEST FOLLOW UP TESTING OF ANTITHROMBIN III LEVELS.   APTT     Status: Abnormal   Collection Time: 06/17/14  3:00 AM  Result Value Ref Range   aPTT 47 (H) 24 - 37 seconds    Comment:        IF BASELINE aPTT IS ELEVATED, SUGGEST PATIENT RISK ASSESSMENT BE USED TO DETERMINE APPROPRIATE ANTICOAGULANT THERAPY.     Imaging: No results found.  Assessment:  Principal Problem:   Unstable angina Active Problems:   CAD, ARTERY BYPASS GRAFT   Pulmonary embolus   Plan:  1. Dose plavix this morning (he had refused until discussing it with MD).  Restart Eliquis 5 mg BID per pharmacy. D/c heparin. No further chest pain. Wants to go home today. [place] for discharge home. Follow-up with Dr. Johnsie Cancel.  Time Spent Directly with Patient:  30 minutes  Length of Stay:  LOS: 1 day   Pixie Casino, MD, [facility] Attending Cardiologist [facility]  Braxen Dobek C 06/17/2014, 10:52 AM

## 2014-06-17 NOTE — Progress Notes (Signed)
ANTICOAGULATION CONSULT NOTE - Follow Up Consult  Pharmacy Consult for Eliquis Indication: h/o PE  Labs:  Recent Labs  06/15/14 0830 06/17/14 0300  HGB 13.0 13.0  HCT 40.0 38.8*  PLT 263.0 234  APTT  --  47*  LABPROT 12.4  --   INR 1.1*  --   HEPARINUNFRC  --  0.80*  CREATININE 0.9 0.84     Assessment/Plan:  73yo male to transition back to Eliquis for h/o PE in October. Will d/c heparin gtt and restart Eliquis 5 mg PO bid prior to discharge.   Ruta Hinds. Velva Harman, PharmD Clinical Pharmacist - Resident Pager: [phone] Pharmacy: [phone] 06/17/2014 11:04 AM

## 2014-06-18 DIAGNOSIS — Z7901 Long term (current) use of anticoagulants: Secondary | ICD-10-CM

## 2014-06-18 NOTE — Discharge Summary (Signed)
Patient ID: LC JOYNT,  MRN: [id number], DOB/AGE: Feb 16, 1941 73 y.o.  Admit date: 06/16/2014 Discharge date: 06/18/2014  Primary Care Provider: Kandice Hams, MD Primary Cardiologist: Dr Johnsie Cancel  Discharge Diagnoses Principal Problem:   Unstable angina Active Problems:   CAD S/P RCA DES and HSRA 06/16/14   Type 2 diabetes mellitus   S/P CABG 1996   Pulmonary embolus- Oct 2015   Chronic anticoagulation   Essential hypertension    Procedures: Cath/ PCI 06/16/14   Hospital Course:  73 year old male patient of Dr.Nishan who had bilateral pulmonary embolus in Oct 2015. He was treated with Apixaban 10 mg twice a day for 7 days decrease to 5 mg twice a day for at least 6 months. The patient does have coronary artery disease and is status post CABG in 1996 with known occlusion of his grafts to the PDA and OM. His LIMA is atretic with the LAD filling retrograde from his SVG to the diagonal. The native RCA is patent with multiple stents. His last cath was in 2009. He was on chronic Plavix but this was stopped when he was placed on Eliquis in Oct for PE. His last Myoview in 2010 was low risk with small inferior infarct, mild peri-infarct ischemia EF 58%. The pt was seen as an OP with back and chest pain suspicious for angina. He was admitted for cath 06/16/14. This revealed significant disease in the native RCA. He underwent PCI with DES to the mid RCA and cutting balloon to the distal RCA. The plan is for Eliquis and Plavix for a year- no ASA. The pt initially refused to take Plavix until he discussed it with Dr Debara Pickett. When discharged the RN reported he declined cardiac rehab and any further education offered.   Discharge Vitals:  Blood pressure 122/56, pulse 64, temperature 97.7 F (36.5 C), temperature source Oral, resp. rate 16, height 5\' 9"  (1.753 m), weight 227 lb (102.967 kg), SpO2 94 %.    Labs: No results found for this or any previous visit (from the past 24  hour(s)).  Disposition:      Follow-up Information    Follow up with Jenkins Rouge, MD.   Specialty:  Cardiology   Why:  office will call you   Contact information:   [address] [place] [address] [phone]       Discharge Medications:    Medication List    STOP taking these medications        aspirin EC 81 MG tablet      TAKE these medications        acetaminophen 325 MG tablet  Commonly known as:  TYLENOL  Take 2 tablets (650 mg total) by mouth every 4 (four) hours as needed for headache or mild pain.     amLODipine 10 MG tablet  Commonly known as:  NORVASC  Take 1 tablet (10 mg total) by mouth at bedtime.     apixaban 5 MG Tabs tablet  Commonly known as:  ELIQUIS  Take 1 tablet (5 mg total) by mouth 2 (two) times daily.     CALCIUM 600 + D PO  Take 1 tablet by mouth 2 (two) times daily.     clopidogrel 75 MG tablet  Commonly known as:  PLAVIX  Take 1 tablet (75 mg total) by mouth daily with breakfast.     HYDROcodone-acetaminophen 10-325 MG per tablet  Commonly known as:  NORCO  Take 1 tablet by mouth 2 (  two) times daily.     isosorbide mononitrate 60 MG 24 hr tablet  Commonly known as:  IMDUR  Take 60 mg by mouth at bedtime.     Magnesium 250 MG Tabs  Take 250 mg by mouth at bedtime.     metFORMIN 500 MG tablet  Commonly known as:  GLUCOPHAGE  Take 2 tablets (1,000 mg total) by mouth 2 (two) times daily with a meal.     metoprolol succinate 100 MG 24 hr tablet  Commonly known as:  TOPROL-XL  Take 1 tablet (100 mg total) by mouth daily. Take with or immediately following a meal: 1 tablet (100 mg) every morning and 1/2 tablet (50 mg) every night     OVER THE COUNTER MEDICATION  Take 2 capsules by mouth 2 (two) times daily. Anti-oxidant with spirulina     ramipril 10 MG capsule  Commonly known as:  ALTACE  Take 1 capsule (10 mg total) by mouth daily.         Duration of Discharge Encounter: Greater than 30 minutes  including physician time.  Angelena Form PA-C 06/18/2014 10:40 AM

## 2014-06-29 DIAGNOSIS — M150 Primary generalized (osteo)arthritis: Secondary | ICD-10-CM | POA: Diagnosis not present

## 2014-06-29 DIAGNOSIS — G894 Chronic pain syndrome: Secondary | ICD-10-CM | POA: Diagnosis not present

## 2014-06-29 DIAGNOSIS — G5721 Lesion of femoral nerve, right lower limb: Secondary | ICD-10-CM | POA: Diagnosis not present

## 2014-06-29 DIAGNOSIS — Z79891 Long term (current) use of opiate analgesic: Secondary | ICD-10-CM | POA: Diagnosis not present

## 2014-07-05 NOTE — Progress Notes (Signed)
Patient ID: Carlos Herman, male   DOB: 08-10-1940, 74 y.o.   MRN: [id number] 73 y.o. male who had bilateral pulmonary embolus in Oct 2015. He was treated with Apixaban 10 mg twice a day for 7 days decrease to 5 mg twice a day for at least 6 months. The patient does have coronary artery disease and is status post CABG in 1996 with known occlusion of his grafts to the PDA and OM. His LIMA is atretic with the LAD filling retrograde from his SVG to the diagonal. The native RCA is patent with multiple stents. His last cath was in 2009. He was on chronic Plavix but this was stopped when he was placed on Eliquis in Oct for PE. His last Myoview in 2010 was low risk with small inferior infarct, mild peri-infarct ischemia EF 58%. The pt was seen as an OP with back and chest pain suspicious for angina. He was admitted for cath 06/16/14. This revealed significant disease in the native RCA. He underwent PCI with DES to the mid RCA and cutting balloon to the distal RCA. The plan is for Eliquis and Plavix for a year- no ASA.    Since d/c less edema in feet No bad chest pains Needs new nitro.  No bleeding issues     ROS: Denies fever, malais, weight loss, blurry vision, decreased visual acuity, cough, sputum, SOB, hemoptysis, pleuritic pain, palpitaitons, heartburn, abdominal pain, melena, lower extremity edema, claudication, or rash.  All other systems reviewed and negative  General: Affect appropriate Chronically ill overweight male  HEENT: normal Neck supple with no adenopathy JVP normal no bruits no thyromegaly Lungs clear with no wheezing and good diaphragmatic motion Heart:  S1/S2 no murmur, no rub, gallop or click PMI normal Abdomen: benighn, BS positve, no tenderness, no AAA no bruit.  No HSM or HJR Distal pulses intact with no bruits No edema Neuro non-focal Skin warm and dry No muscular weakness   Current Outpatient Prescriptions  Medication Sig Dispense Refill  . acetaminophen (TYLENOL)  325 MG tablet Take 2 tablets (650 mg total) by mouth every 4 (four) hours as needed for headache or mild pain.    Marland Kitchen amLODipine (NORVASC) 10 MG tablet Take 1 tablet (10 mg total) by mouth at bedtime. 30 tablet 6  . apixaban (ELIQUIS) 5 MG TABS tablet Take 1 tablet (5 mg total) by mouth 2 (two) times daily. 60 tablet 6  . Calcium Carbonate-Vitamin D (CALCIUM 600 + D PO) Take 1 tablet by mouth 2 (two) times daily.     . clopidogrel (PLAVIX) 75 MG tablet Take 1 tablet (75 mg total) by mouth daily with breakfast. 30 tablet 11  . HYDROcodone-acetaminophen (NORCO) 10-325 MG per tablet Take 1 tablet by mouth 2 (two) times daily.     . isosorbide mononitrate (IMDUR) 60 MG 24 hr tablet Take 60 mg by mouth at bedtime.    . Magnesium 250 MG TABS Take 250 mg by mouth at bedtime.    . metFORMIN (GLUCOPHAGE) 500 MG tablet Take 2 tablets (1,000 mg total) by mouth 2 (two) times daily with a meal.    . metoprolol succinate (TOPROL-XL) 100 MG 24 hr tablet Take 1 tablet (100 mg total) by mouth daily. Take with or immediately following a meal: 1 tablet (100 mg) every morning and 1/2 tablet (50 mg) every night 45 tablet 3  . OVER THE COUNTER MEDICATION Take 2 capsules by mouth 2 (two) times daily. Anti-oxidant with spirulina    .  ramipril (ALTACE) 10 MG capsule Take 1 capsule (10 mg total) by mouth daily. 90 capsule 3   No current facility-administered medications for this visit.    Allergies  Penicillins and Statins  Electrocardiogram:  06/22/14  SR rate 64 PAC today 07/06/13  SR rate 70  PAC's otherwise normal   Assessment and Plan

## 2014-07-06 ENCOUNTER — Encounter: Payer: Self-pay | Admitting: Cardiovascular Disease

## 2014-07-06 ENCOUNTER — Other Ambulatory Visit: Payer: Self-pay | Admitting: *Deleted

## 2014-07-06 ENCOUNTER — Ambulatory Visit (INDEPENDENT_AMBULATORY_CARE_PROVIDER_SITE_OTHER): Payer: Medicare Other | Admitting: Cardiovascular Disease

## 2014-07-06 VITALS — BP 126/62 | HR 70 | Ht 69.0 in | Wt 225.0 lb

## 2014-07-06 DIAGNOSIS — I10 Essential (primary) hypertension: Secondary | ICD-10-CM | POA: Diagnosis not present

## 2014-07-06 DIAGNOSIS — E785 Hyperlipidemia, unspecified: Secondary | ICD-10-CM | POA: Diagnosis not present

## 2014-07-06 DIAGNOSIS — I2699 Other pulmonary embolism without acute cor pulmonale: Secondary | ICD-10-CM | POA: Diagnosis not present

## 2014-07-06 MED ORDER — NITROGLYCERIN 0.4 MG SL SUBL: 0.4000 mg | SUBLINGUAL_TABLET | SUBLINGUAL | Status: AC | PRN

## 2014-07-06 NOTE — Patient Instructions (Signed)
Your physician wants you to follow-up in:   3 MONTHS WITH  DR NISHAN  You will receive a reminder letter in the mail two months in advance. If you don't receive a letter, please call our office to schedule the follow-up appointment. Your physician recommends that you continue on your current medications as directed. Please refer to the Current Medication list given to you today.  

## 2014-07-06 NOTE — Assessment & Plan Note (Signed)
Cholesterol is at goal.  Continue current dose of statin and diet Rx.  No myalgias or side effects.  F/U  LFT's in 6 months. Lab Results  Component Value Date   LDLCALC * 08/03/2007    104        Total Cholesterol/HDL:CHD Risk Coronary Heart Disease Risk Table                     Men   Women  1/2 Average Risk   3.4   3.3

## 2014-07-06 NOTE — Assessment & Plan Note (Signed)
No angina continue plavix with eliquis  New nitro called in

## 2014-07-06 NOTE — Assessment & Plan Note (Signed)
Needs anticoagulation until 10/16 at least.  LE edema improved

## 2014-07-06 NOTE — Assessment & Plan Note (Signed)
Well controlled.  Continue current medications and low sodium Dash type diet.    

## 2014-07-10 DIAGNOSIS — E119 Type 2 diabetes mellitus without complications: Secondary | ICD-10-CM | POA: Diagnosis not present

## 2014-07-26 ENCOUNTER — Ambulatory Visit: Payer: Medicare Other | Admitting: Cardiovascular Disease

## 2014-07-27 DIAGNOSIS — Z79891 Long term (current) use of opiate analgesic: Secondary | ICD-10-CM | POA: Diagnosis not present

## 2014-07-27 DIAGNOSIS — G894 Chronic pain syndrome: Secondary | ICD-10-CM | POA: Diagnosis not present

## 2014-07-27 DIAGNOSIS — G5721 Lesion of femoral nerve, right lower limb: Secondary | ICD-10-CM | POA: Diagnosis not present

## 2014-07-27 DIAGNOSIS — M533 Sacrococcygeal disorders, not elsewhere classified: Secondary | ICD-10-CM | POA: Diagnosis not present

## 2014-07-27 DIAGNOSIS — M150 Primary generalized (osteo)arthritis: Secondary | ICD-10-CM | POA: Diagnosis not present

## 2014-08-09 ENCOUNTER — Telehealth: Payer: Self-pay | Admitting: Cardiovascular Disease

## 2014-08-09 DIAGNOSIS — R42 Dizziness and giddiness: Secondary | ICD-10-CM

## 2014-08-09 NOTE — Telephone Encounter (Signed)
SPOKE WITH   PT  RE  MESSAGE   IS  COMPLAINING  WITH NOT  FEELING   WELL AT  ALL TIMES  BUT NOTES MORE    DIZZINESS  WHEN  CHANGES  TO  STANDING  POSITION  ENCOURAGED PT  TO  MONITOR  B/P AND  HEART  WITH  AUTOMATIC  MACHINE  PT  DOES NOT  HAVE  1 CLAIMS  THAT  THEY ARE NOT  ACCURATE   AND  REFUSES TO PURCHASE 1  AT THIS TIME  STATES  WILL GO TO DRUG  STORE  AND  CHECK B/P AND CALL LATER  TODAY WITH READING  INFORMED  MAY NEED MOR THAN 1  READING  PT WAS RUDE   BY THE  END OF CONVERSATION  ONLY  TRYING  TO HELP   AND  TO DETERMINE   WHAT  DIRECTION  TO  PROCEED WITH   WILL AWAIT   RETURN CALL FROM PT .Adonis Housekeeper

## 2014-08-09 NOTE — Telephone Encounter (Signed)
WILL FORWARD  B/P   AND HEART  RATE  READINGS TO DR Johnsie Cancel FOR   REVIEW  .Adonis Housekeeper

## 2014-08-09 NOTE — Telephone Encounter (Signed)
Hold ramapril if dizzy.  Have him see flex PA  Check CBC and BMET

## 2014-08-09 NOTE — Telephone Encounter (Signed)
Pt c/o Syncope: STAT if syncope occurred within 30 minutes and pt complains of lightheadedness High Priority if episode of passing out, completely, today or in last 24 hours   1. Did you pass out today? No  2. When is the last time you passed out? NO   3. Has this occurred multiple times? NO   4. Did you have any symptoms prior to passing out? NO  Pt is having DIZZINESS for 4 days and stated he isn't having any passing out episode.

## 2014-08-09 NOTE — Telephone Encounter (Signed)
Follow up     Pt c/o BP issue: STAT if pt c/o blurred vision, one-sided weakness or slurred speech  1. What are your last 5 BP readings? 166/74 HR 56; 168/69 HR 46 2. Are you having any other symptoms (ex. Dizziness, headache, blurred vision, passed out)?  3. What is your BP issue? Calling to give bp readings

## 2014-08-10 ENCOUNTER — Other Ambulatory Visit (INDEPENDENT_AMBULATORY_CARE_PROVIDER_SITE_OTHER): Payer: Medicare Other | Admitting: *Deleted

## 2014-08-10 ENCOUNTER — Encounter: Payer: Self-pay | Admitting: Physician Assistant

## 2014-08-10 ENCOUNTER — Ambulatory Visit (INDEPENDENT_AMBULATORY_CARE_PROVIDER_SITE_OTHER): Payer: Medicare Other | Admitting: Physician Assistant

## 2014-08-10 VITALS — BP 137/67 | HR 59 | Ht 69.0 in | Wt 229.0 lb

## 2014-08-10 DIAGNOSIS — I2699 Other pulmonary embolism without acute cor pulmonale: Secondary | ICD-10-CM | POA: Diagnosis not present

## 2014-08-10 DIAGNOSIS — I2510 Atherosclerotic heart disease of native coronary artery without angina pectoris: Secondary | ICD-10-CM | POA: Diagnosis not present

## 2014-08-10 DIAGNOSIS — R42 Dizziness and giddiness: Secondary | ICD-10-CM

## 2014-08-10 DIAGNOSIS — I10 Essential (primary) hypertension: Secondary | ICD-10-CM | POA: Diagnosis not present

## 2014-08-10 LAB — CBC WITH DIFFERENTIAL/PLATELET
Basophils Absolute: 0 10*3/uL (ref 0.0–0.1)
Basophils Relative: 0.2 % (ref 0.0–3.0)
EOS ABS: 0 10*3/uL (ref 0.0–0.7)
Eosinophils Relative: 0.4 % (ref 0.0–5.0)
HCT: 39 % (ref 39.0–52.0)
Hemoglobin: 13.4 g/dL (ref 13.0–17.0)
LYMPHS ABS: 2.1 10*3/uL (ref 0.7–4.0)
Lymphocytes Relative: 20.7 % (ref 12.0–46.0)
MCHC: 34.3 g/dL (ref 30.0–36.0)
MCV: 92.4 fl (ref 78.0–100.0)
MONO ABS: 0.6 10*3/uL (ref 0.1–1.0)
Monocytes Relative: 5.7 % (ref 3.0–12.0)
Neutro Abs: 7.3 10*3/uL (ref 1.4–7.7)
Neutrophils Relative %: 73 % (ref 43.0–77.0)
Platelets: 256 10*3/uL (ref 150.0–400.0)
RBC: 4.22 Mil/uL (ref 4.22–5.81)
RDW: 13.3 % (ref 11.5–15.5)
WBC: 10 10*3/uL (ref 4.0–10.5)

## 2014-08-10 LAB — BASIC METABOLIC PANEL
BUN: 14 mg/dL (ref 6–23)
CALCIUM: 9.4 mg/dL (ref 8.4–10.5)
CO2: 29 mEq/L (ref 19–32)
Chloride: 101 mEq/L (ref 96–112)
Creatinine, Ser: 0.92 mg/dL (ref 0.40–1.50)
GFR: 85.65 mL/min (ref >60.00)
Glucose, Bld: 172 mg/dL — ABNORMAL HIGH (ref 70–99)
Potassium: 4.3 mEq/L (ref 3.5–5.1)
Sodium: 137 mEq/L (ref 135–145)

## 2014-08-10 NOTE — Patient Instructions (Signed)
Your physician recommends that you schedule a follow-up appointment in: 4 - 6 weeks with Dr. Johnsie Cancel   Your physician recommends that you schedule a follow-up appointment in: 2-3 weeks with your Primary Care Physician  Your physician has requested that you have a carotid duplex. This test is an ultrasound of the carotid arteries in your neck. It looks at blood flow through these arteries that supply the brain with blood. Allow one hour for this exam. There are no restrictions or special instructions.  Your physician has recommended that you wear a holter monitor. Holter monitors are medical devices that record the heart's electrical activity. Doctors most often use these monitors to diagnose arrhythmias. Arrhythmias are problems with the speed or rhythm of the heartbeat. The monitor is a small, portable device. You can wear one while you do your normal daily activities. This is usually used to diagnose what is causing palpitations/syncope (passing out).  Your physician would like you to come back in 1 WEEK for a BP Check with the nurse.   Continue to HOLD the Ramipril

## 2014-08-10 NOTE — Telephone Encounter (Signed)
I spoke with pt & he was feeling dizzy. He has not taken his Ramipril  He will come in today for a cbc and bmet.  Will have him scheduled for a flex pa visit today at 1:30pm    Pt is aware & will see PA today Horton Chin RN

## 2014-08-10 NOTE — Addendum Note (Signed)
Addended by: Eulis Foster on: 08/10/2014 01:28 PM   Modules accepted: Orders

## 2014-08-10 NOTE — Progress Notes (Signed)
Cardiology Office Note   Date:  08/10/2014   ID:  Carlos Herman, DOB 1941-05-01, MRN [id number]  PCP:  Kandice Hams, MD  Cardiologist:  Dr. Jenkins Rouge     Chief Complaint  Patient presents with  . Hypertension     History of Present Illness: Carlos Herman is a 74 y.o. male with a hx of CAD s/p CABG in 1996, DM, HTN, HLD.  Cath in 2009 with patent RCA with diffuse stenting, occluded Circumflex and LAD with patent SVG to Diagonal filling LAD but occluded SVG to OM and SVG to RCA. He has been on anti-coagulation with Eliquis for bilateral PE dx'd in October 2015.  Patient was seen in the office in December 2015 for chest pain c/w angina and set up for cardiac cath.  LHC revealed significant disease in the native RCA. He underwent PCI with DES to the mid RCA and cutting balloon to the distal RCA. The was placed on Eliquis and Plavix for a year- no ASA.  Last seen by Dr. Johnsie Cancel 06/2014.  Called in today with complaints of dizziness.  BP reportedly in 160/70s.  He was advised to hold Ramipril and to FU today (needs BMET, CBC).  The patient notes dizziness/lightheadedness over the past 2-3 weeks.  It seems to be hard for him to describe.  He states it is like a "buzzing feeling."  It seems fairly constant.  But, he tends to notice it more when he is upright and moving around.  He denies a spinning sensation.  He denies near syncope or syncope.  He denies any change with change in head position or use of his upper extremities.  He denies chest pain or significant dyspnea.  This remains improved since his PCI.  He is adherent to his medications.  He denies orthopnea, PND.  He has chronic LE edema on the R related to prior injuries/surgeries.  He denies any significant change.  Actually, he feels this has improved since he had his recent PCI.     Studies/Reports Reviewed Today:  Myoview 02/2009 Previous inferolateral infarct with very mild overlying ischemia. Low Risk  Echo 04/10/14 -  Mild LVH. EF 55% to 60%. Wall motion was normal - Aorta: Aortic root dimension: 39 mm (ED). - Aortic root: The aortic root is top normal in size. - Mitral valve: Mild regurgitation. - Left atrium: Mildly dilated (36 ml/m2).  Cardiac Cath/PCI 06/16/14 LAD:  prox occluded, mid and dist fills from patent S-Dx LCx:  known to be a diffusely diseased, small vessel and not favorable for PCI during last cath.  RCA:  patent stents in the proximal, mid and distal vessel. 40% beyond prox stented segment, 80% ISR mid stent, 80% ISR dist stent. RV marginal br 99% >> PCI: 3.0 x 20 mm Promus Premier DES in the mid RCA; POBA/cutting balloon x 2 to dist RCA S-OM occluded (old)  S-RCA occluded (old) S-Dx patent filling the LAD - distal segment of Dx branch newly occluded.  L-LAD small and atretic (old)  EF=55-60%   Past Medical History  Diagnosis Date  . Depression   . Osteoarthritis   . Stroke \  . Dyslipidemia   . DM (diabetes mellitus)   . Nephrolithiasis   . Obesity     morbid  . Leg edema     right  . Hypertension   . CAD (coronary artery disease)     a. s/p CABG; b. s/p diffuse stenting to RCA; c. 3/4 grafts known  to be occluded;  d.  LHC 12/15:  pLAD 100, small CFx diff disz - no amenable to PCI, RCA severe mid and dist ISR, S-OM, S-RCA 100, L-LAD atretic, S-Dx ok (dist seg of Dx 100) >> PCI:  DES to mRCA and POBA/cutting balloon to dist RCA   . Hemorrhoids     internal  . Hx of echocardiogram     a. Echo 10/15:  mild LVH, EF 55-60%, no RWMA, Ao root 39 mm, mild MR, mild LAE  . Pulmonary embolism     dx 03/2014 >> On Apixaban    Past Surgical History  Procedure Laterality Date  . Heart bypass  1996  . Angioplasty / stenting iliac    . Total knee arthroplasty      right  . Left heart catheterization with coronary/graft angiogram N/A 06/16/2014    Procedure: LEFT HEART CATHETERIZATION WITH Beatrix Fetters;  Surgeon: Burnell Blanks, MD;  Location[facility] CATH LAB;   Service: Cardiovascular;  Laterality: N/A;  . Percutaneous coronary stent intervention (pci-s)  06/16/2014    Procedure: PERCUTANEOUS CORONARY STENT INTERVENTION (PCI-S);  Surgeon: Burnell Blanks, MD;  Location: [facility] CATH LAB;  Service: Cardiovascular;;  MID RCA, Cutting Balloon and Stent Distal RCA, Cutting Balloon      Current Outpatient Prescriptions  Medication Sig Dispense Refill  . amLODipine (NORVASC) 10 MG tablet Take 1 tablet (10 mg total) by mouth at bedtime. 30 tablet 6  . apixaban (ELIQUIS) 5 MG TABS tablet Take 1 tablet (5 mg total) by mouth 2 (two) times daily. 60 tablet 6  . Cholecalciferol (VITAMIN D3 SUPER STRENGTH) 2000 UNITS TABS Take 2,000 Units by mouth daily.    . clopidogrel (PLAVIX) 75 MG tablet Take 1 tablet (75 mg total) by mouth daily with breakfast. 30 tablet 11  . isosorbide mononitrate (IMDUR) 60 MG 24 hr tablet Take 60 mg by mouth at bedtime.    . metFORMIN (GLUCOPHAGE) 500 MG tablet Take 2 tablets (1,000 mg total) by mouth 2 (two) times daily with a meal.    . metoprolol succinate (TOPROL-XL) 100 MG 24 hr tablet Take 1 tablet (100 mg total) by mouth daily. Take with or immediately following a meal: 1 tablet (100 mg) every morning and 1/2 tablet (50 mg) every night (Patient taking differently: Take with or immediately following a meal: 1 tablet (100 mg) every morning and 1/2 tablet (50 mg) every night) 45 tablet 3  . nitroGLYCERIN (NITROSTAT) 0.4 MG SL tablet Place 1 tablet (0.4 mg total) under the tongue every 5 (five) minutes as needed for chest pain. 25 tablet 4  . NON FORMULARY Take 1 tablet by mouth daily. Take one tab by mouth daily of Vitamin D3 2000 mg, Calcium 333 mg, magnesium 133 mg, zinc 5mg .    . oxyCODONE-acetaminophen (PERCOCET) 10-325 MG per tablet Take 1 tablet by mouth every 4 (four) hours as needed.  0  . ramipril (ALTACE) 10 MG capsule Take 1 capsule by mouth daily.  0   No current facility-administered medications for this visit.     Allergies:   Penicillins and Statins    Social History:  The patient  reports that he has never smoked. He has never used smokeless tobacco. He reports that he drinks alcohol. He reports that he does not use illicit drugs.   Family History:  The patient's family history includes Diabetes in his mother; Stroke in his mother. There is no history of Heart attack.    ROS:  Please  see the history of present illness.   He fell recently and bruised his coccyx.  He was seen by Dr. Nicholaus Bloom (pain doctor) and did not require xrays.  He says he tripped - R leg is weak related to prior injuries/surgeries.  Otherwise, review of systems are positive for none.   All other systems are reviewed and negative.    PHYSICAL EXAM: VS:  BP 137/67 mmHg  Pulse 59  Ht 5\' 9"  (1.753 m)  Wt 229 lb (103.874 kg)  BMI 33.80 kg/m2  SpO2 97%   Repeat BP by me:  L 126/60;  R 130/60  Orthostatic VS for the past 24 hrs:  BP- Lying Pulse- Lying BP- Sitting Pulse- Sitting BP- Standing at 0 minutes Pulse- Standing at 0 minutes  08/10/14 1351 137/67 mmHg 59 138/81 mmHg (!) 46 156/66 mmHg (!) 45    Wt Readings from Last 3 Encounters:  08/10/14 229 lb (103.874 kg)  07/06/14 225 lb (102.059 kg)  06/16/14 227 lb (102.967 kg)     GEN: Well nourished, well developed, in no acute distress HEENT: normalno obvious nystagmus with lying flat Neck: I cannot appreciate JVD, ? R carotid vs supraclavicular bruit, no masses Cardiac:  Normal S3/M1, RRR; 1/6 systolic murmur LLSB, no rubs or gallops, 1+ RLE edema, trace LLE edema  Respiratory:  clear to auscultation bilaterally, no wheezing, rhonchi or rales. GI: soft, nontender, nondistended, + BS MS: no deformity or atrophy Skin: warm and dry  Neuro:  CNs II-XII intact, Strength and sensation are intact Psych: Normal affect   EKG:  EKG is ordered today.  It demonstrates:   NSR, HR 69, normal axis, PACs, no change from prior tracing.   Recent Labs: 06/17/2014: BUN  13; Creatinine 0.84; Hemoglobin 13.0; Platelets 234; Potassium 4.2; Sodium 138     ASSESSMENT AND PLAN:  1.  Dizziness:  Etiology not entirely clear.  Orthostatic VS do not demonstrate a significant BP drop today.  HR is actually lower than reading on ECG.  Significance of this is unclear - suspect related to device used to obtain HR.  He has a ? R carotid bruit on exam and could certainly have cerebrovascular disease.  He has a hx of mild dizziness in the past that would resolve with stretching ("popping") his neck.  He could have cervicogenic dizziness causing his symptoms.  No signs or symptoms of vertigo.  He is not having near syncope.  BP in both arms is equal.      -  Continue to hold Ramipril for now.  Get BP check with RN in 1 week.  If symptoms no different and BP increasing, resume Ramipril.  If symptoms better, remain off Ramipril.    -  Obtain 48 Hr Holter.  If significant bradycardia, will need reduced dose of beta blocker.     -  Get carotid dopplers to rule out carotid and vertebral artery stenosis.    -  Arrange FU with PCP to assess for other causes of dizziness.  2.  Coronary artery disease:  No angina.  Continue amlodipine, Plavix, nitrates, Toprol-XL.   3.  Essential hypertension:  BP at target by my check.  As noted, remain off of Ramipril for now and check BP with RN in 1 week.  4.  Pulmonary embolus- Oct 2015:  Continue Eliquis, which he is tolerating.  BMET, CBC pending from today.     Current medicines are reviewed at length with the patient today.  The patient does  not have concerns regarding medicines.  The following changes have been made:  As above.   Labs/ tests ordered today include:  Orders Placed This Encounter  Procedures  . EKG 12-Lead  . Holter monitor - 48 hour  . Doppler carotid    Disposition:   FU with Kandice Hams, MD in 2 weeks and Dr. Jenkins Rouge in 4-6 weeks.    Signed, Versie Starks, MHS 08/10/2014 2:10 PM    [facility] [address], [place], [place]  [address] Phone: [phone]; Fax: [phone]

## 2014-08-16 ENCOUNTER — Encounter: Payer: Self-pay | Admitting: *Deleted

## 2014-08-16 ENCOUNTER — Encounter (INDEPENDENT_AMBULATORY_CARE_PROVIDER_SITE_OTHER): Payer: Medicare Other

## 2014-08-16 DIAGNOSIS — R42 Dizziness and giddiness: Secondary | ICD-10-CM | POA: Diagnosis not present

## 2014-08-16 DIAGNOSIS — I10 Essential (primary) hypertension: Secondary | ICD-10-CM

## 2014-08-16 DIAGNOSIS — I2510 Atherosclerotic heart disease of native coronary artery without angina pectoris: Secondary | ICD-10-CM

## 2014-08-16 NOTE — Progress Notes (Signed)
Patient ID: Carlos Herman, male   DOB: 1941-04-03, 74 y.o.   MRN: [id number] [employer] 48 hour holter monitor applied to patient.

## 2014-08-17 ENCOUNTER — Encounter (HOSPITAL_COMMUNITY): Payer: Medicare Other

## 2014-08-18 ENCOUNTER — Ambulatory Visit (INDEPENDENT_AMBULATORY_CARE_PROVIDER_SITE_OTHER): Payer: Medicare Other | Admitting: *Deleted

## 2014-08-18 ENCOUNTER — Encounter: Payer: Self-pay | Admitting: *Deleted

## 2014-08-18 ENCOUNTER — Ambulatory Visit (HOSPITAL_COMMUNITY): Payer: Medicare Other | Attending: Cardiovascular Disease | Admitting: Cardiology

## 2014-08-18 VITALS — BP 124/68 | HR 84 | Wt 227.8 lb

## 2014-08-18 DIAGNOSIS — I739 Peripheral vascular disease, unspecified: Secondary | ICD-10-CM | POA: Diagnosis not present

## 2014-08-18 DIAGNOSIS — Z951 Presence of aortocoronary bypass graft: Secondary | ICD-10-CM | POA: Insufficient documentation

## 2014-08-18 DIAGNOSIS — Z87891 Personal history of nicotine dependence: Secondary | ICD-10-CM | POA: Diagnosis not present

## 2014-08-18 DIAGNOSIS — E785 Hyperlipidemia, unspecified: Secondary | ICD-10-CM | POA: Diagnosis not present

## 2014-08-18 DIAGNOSIS — I10 Essential (primary) hypertension: Secondary | ICD-10-CM

## 2014-08-18 DIAGNOSIS — I2510 Atherosclerotic heart disease of native coronary artery without angina pectoris: Secondary | ICD-10-CM

## 2014-08-18 DIAGNOSIS — R0989 Other specified symptoms and signs involving the circulatory and respiratory systems: Secondary | ICD-10-CM | POA: Insufficient documentation

## 2014-08-18 DIAGNOSIS — E119 Type 2 diabetes mellitus without complications: Secondary | ICD-10-CM | POA: Insufficient documentation

## 2014-08-18 DIAGNOSIS — R42 Dizziness and giddiness: Secondary | ICD-10-CM

## 2014-08-18 NOTE — Progress Notes (Signed)
BP looks good. Continue with current treatment plan. Richardson Dopp, PA-C   08/18/2014 1:59 PM

## 2014-08-18 NOTE — Progress Notes (Signed)
Carotid duplex performed 

## 2014-08-18 NOTE — Progress Notes (Signed)
1.) Reason for visit: BP check per Margaret Pyle due to stopping Ramipril  2.) Name of MD requesting visit: Scott Weaver,PA   3.) H&P: Essential Hypertension  ROS related to problem: No c/o today. States he does not take BP at home. Suggested he get a BP cuff to monitor BP at home.          Will review and forward to [facility] for his recommendations.

## 2014-08-18 NOTE — Patient Instructions (Signed)
Continue same medications  Follow up with Dr. Johnsie Cancel as scheduled.

## 2014-08-21 ENCOUNTER — Encounter: Payer: Self-pay | Admitting: Physician Assistant

## 2014-08-22 ENCOUNTER — Telehealth: Payer: Self-pay | Admitting: *Deleted

## 2014-08-22 NOTE — Telephone Encounter (Signed)
pt notified about carotid results with verbal understanding 

## 2014-08-23 DIAGNOSIS — R42 Dizziness and giddiness: Secondary | ICD-10-CM | POA: Diagnosis not present

## 2014-08-23 DIAGNOSIS — Z23 Encounter for immunization: Secondary | ICD-10-CM | POA: Diagnosis not present

## 2014-08-24 DIAGNOSIS — Z79891 Long term (current) use of opiate analgesic: Secondary | ICD-10-CM | POA: Diagnosis not present

## 2014-08-24 DIAGNOSIS — G894 Chronic pain syndrome: Secondary | ICD-10-CM | POA: Diagnosis not present

## 2014-08-24 DIAGNOSIS — M150 Primary generalized (osteo)arthritis: Secondary | ICD-10-CM | POA: Diagnosis not present

## 2014-08-24 DIAGNOSIS — G5721 Lesion of femoral nerve, right lower limb: Secondary | ICD-10-CM | POA: Diagnosis not present

## 2014-08-25 ENCOUNTER — Other Ambulatory Visit: Payer: Self-pay

## 2014-08-25 MED ORDER — METOPROLOL SUCCINATE ER 100 MG PO TB24: 100.0000 mg | ORAL_TABLET | Freq: Every day | ORAL | Status: DC

## 2014-08-28 ENCOUNTER — Telehealth: Payer: Self-pay | Admitting: *Deleted

## 2014-08-28 NOTE — Telephone Encounter (Signed)
LM TO CALL BACK ./CY 

## 2014-08-28 NOTE — Telephone Encounter (Signed)
Follow Up   Pt returned call// please call

## 2014-08-28 NOTE — Telephone Encounter (Signed)
LM TO  CALL BACK RE MONITOR  RESULTS PER  DR NISHAN SR  WITH  PAC'S AND  PVC'S  NO AFIB .Adonis Housekeeper

## 2014-08-29 NOTE — Telephone Encounter (Signed)
Follow up      Returned Christine's call

## 2014-08-29 NOTE — Telephone Encounter (Signed)
PT  AWARE OF  MONITOR RESULTS ./CY 

## 2014-08-31 ENCOUNTER — Other Ambulatory Visit: Payer: Self-pay | Admitting: *Deleted

## 2014-08-31 MED ORDER — METOPROLOL SUCCINATE ER 100 MG PO TB24: ORAL_TABLET | ORAL | Status: DC

## 2014-09-12 NOTE — Progress Notes (Signed)
Cardiology Office Note   Date:  09/12/2014   ID:  Carlos Herman, DOB 01/28/1941, MRN [id number]  PCP:  Kandice Hams, MD  Cardiologist:  Dr. Jenkins Rouge     No chief complaint on file.    History of Present Illness: Carlos Herman is a 74 y.o. male with a hx of CAD s/p CABG in 1996, DM, HTN, HLD.  Cath in 2009 with patent RCA with diffuse stenting, occluded Circumflex and LAD with patent SVG to Diagonal filling LAD but occluded SVG to OM and SVG to RCA. He has been on anti-coagulation with Eliquis for bilateral PE dx'd in October 2015.  Patient was seen in the office in December 2015 for chest pain c/w angina and set up for cardiac cath.  LHC revealed significant disease in the native RCA. He underwent PCI with DES to the mid RCA and cutting balloon to the distal RCA. The was placed on Eliquis and Plavix for a year- no ASA.  Last seen by PA 2/16 .  Complaints of dizziness.  BP reportedly in 160/70s.  He was advised to hold Ramipril and to FU today (needs BMET, CBC).  The patient notes dizziness/lightheadedness over the past 2-3 weeks.  It seems to be hard for him to describe.  He states it is like a "buzzing feeling."  It seems fairly constant.  But, he tends to notice it more when he is upright and moving around.  He denies a spinning sensation.  He denies near syncope or syncope.  He denies any change with change in head position or use of his upper extremities.  He denies chest pain or significant dyspnea.  This remains improved since his PCI.  He is adherent to his medications.  He denies orthopnea, PND.  He has chronic LE edema on the R related to prior injuries/surgeries.  He denies any significant change.  Actually, he feels this has improved since he had his recent PCI.    Ramapril stopped and BP has not been too high Holter reviewed and PAC/PVC no afib Carotid reviewed 08/19/14 and normal with left dominant vertebral  ON eliquis for previous PE 2015 [facility]     Studies/Reports Reviewed Today:  Myoview 02/2009 Previous inferolateral infarct with very mild overlying ischemia. Low Risk  Echo 04/10/14 - Mild LVH. EF 55% to 60%. Wall motion was normal - Aorta: Aortic root dimension: 39 mm (ED). - Aortic root: The aortic root is top normal in size. - Mitral valve: Mild regurgitation. - Left atrium: Mildly dilated (36 ml/m2).  Cardiac Cath/PCI 06/16/14 LAD:  prox occluded, mid and dist fills from patent S-Dx LCx:  known to be a diffusely diseased, small vessel and not favorable for PCI during last cath.  RCA:  patent stents in the proximal, mid and distal vessel. 40% beyond prox stented segment, 80% ISR mid stent, 80% ISR dist stent. RV marginal br 99% >> PCI: 3.0 x 20 mm Promus Premier DES in the mid RCA; POBA/cutting balloon x 2 to dist RCA S-OM occluded (old)  S-RCA occluded (old) S-Dx patent filling the LAD - distal segment of Dx branch newly occluded.  L-LAD small and atretic (old)  EF=55-60%   Past Medical History  Diagnosis Date  . Depression   . Osteoarthritis   . Stroke \  . Dyslipidemia   . DM (diabetes mellitus)   . Nephrolithiasis   . Obesity     morbid  . Leg edema     right  .  Hypertension   . CAD (coronary artery disease)     a. s/p CABG; b. s/p diffuse stenting to RCA; c. 3/4 grafts known to be occluded;  d.  LHC 12/15:  pLAD 100, small CFx diff disz - no amenable to PCI, RCA severe mid and dist ISR, S-OM, S-RCA 100, L-LAD atretic, S-Dx ok (dist seg of Dx 100) >> PCI:  DES to mRCA and POBA/cutting balloon to dist RCA   . Hemorrhoids     internal  . Hx of echocardiogram     a. Echo 10/15:  mild LVH, EF 55-60%, no RWMA, Ao root 39 mm, mild MR, mild LAE  . Pulmonary embolism     dx 03/2014 >> On Apixaban  . History of Doppler ultrasound     Carotid US (2/16):  Normal carotid arteries bilaterally.    Past Surgical History  Procedure Laterality Date  . Heart bypass  1996  . Angioplasty / stenting iliac    .  Total knee arthroplasty      right  . Left heart catheterization with coronary/graft angiogram N/A 06/16/2014    Procedure: LEFT HEART CATHETERIZATION WITH Beatrix Fetters;  Surgeon: Burnell Blanks, MD;  Location: [facility] CATH LAB;  Service: Cardiovascular;  Laterality: N/A;  . Percutaneous coronary stent intervention (pci-s)  06/16/2014    Procedure: PERCUTANEOUS CORONARY STENT INTERVENTION (PCI-S);  Surgeon: Burnell Blanks, MD;  Location: [facility] CATH LAB;  Service: Cardiovascular;;  MID RCA, Cutting Balloon and Stent Distal RCA, Cutting Balloon      Current Outpatient Prescriptions  Medication Sig Dispense Refill  . amLODipine (NORVASC) 10 MG tablet Take 1 tablet (10 mg total) by mouth at bedtime. 30 tablet 6  . apixaban (ELIQUIS) 5 MG TABS tablet Take 1 tablet (5 mg total) by mouth 2 (two) times daily. 60 tablet 6  . Cholecalciferol (VITAMIN D3 SUPER STRENGTH) 2000 UNITS TABS Take 2,000 Units by mouth daily.    . clopidogrel (PLAVIX) 75 MG tablet Take 1 tablet (75 mg total) by mouth daily with breakfast. 30 tablet 11  . isosorbide mononitrate (IMDUR) 60 MG 24 hr tablet Take 60 mg by mouth at bedtime.    . metFORMIN (GLUCOPHAGE) 500 MG tablet Take 2 tablets (1,000 mg total) by mouth 2 (two) times daily with a meal.    . metoprolol succinate (TOPROL-XL) 100 MG 24 hr tablet Take with or immediately following a meal: 1 tablet (100 mg) every morning and 1/2 tablet (50 mg) every night 135 tablet 3  . nitroGLYCERIN (NITROSTAT) 0.4 MG SL tablet Place 1 tablet (0.4 mg total) under the tongue every 5 (five) minutes as needed for chest pain. 25 tablet 4  . NON FORMULARY Take 1 tablet by mouth daily. Take daily () Vitamin D3 2000 mg, Calcium 333 mg, magnesium 133 mg, zinc 5mg .)    . oxyCODONE-acetaminophen (PERCOCET) 10-325 MG per tablet Take 1 tablet by mouth every 4 (four) hours as needed.  0  . ramipril (ALTACE) 10 MG capsule Take 1 capsule by mouth daily.  0   No current  facility-administered medications for this visit.    Allergies:   Penicillins and Statins    Social History:  The patient  reports that he has never smoked. He has never used smokeless tobacco. He reports that he drinks alcohol. He reports that he does not use illicit drugs.   Family History:  The patient's family history includes Diabetes in his mother; Stroke in his mother. There is no history of Heart  attack.    ROS:  Please see the history of present illness.   He fell recently and bruised his coccyx.  He was seen by Dr. Nicholaus Bloom (pain doctor) and did not require xrays.  He says he tripped - R leg is weak related to prior injuries/surgeries.  Otherwise, review of systems are positive for none.   All other systems are reviewed and negative.    PHYSICAL EXAM: VS:  There were no vitals taken for this visit.   Repeat BP by me:  L 126/60;  R 130/60  No data found.   Wt Readings from Last 3 Encounters:  08/18/14 103.329 kg (227 lb 12.8 oz)  08/10/14 103.874 kg (229 lb)  07/06/14 102.059 kg (225 lb)     GEN: Well nourished, well developed, in no acute distress HEENT: normalno obvious nystagmus with lying flat Neck: I cannot appreciate JVD, ? R carotid vs supraclavicular bruit, no masses Cardiac:  Normal D1/S9, RRR; 1/6 systolic murmur LLSB, no rubs or gallops, 1+ RLE edema, trace LLE edema  Respiratory:  clear to auscultation bilaterally, no wheezing, rhonchi or rales. GI: soft, nontender, nondistended, + BS MS: no deformity or atrophy Skin: warm and dry  Neuro:  CNs II-XII intact, Strength and sensation are intact Psych: Normal affect   EKG:  08/10/14    NSR, HR 69, normal axis, PACs, no change from prior tracing. Done 07/16/14    Recent Labs: 08/10/2014: BUN 14; Creatinine 0.92; Hemoglobin 13.4; Platelets 256.0; Potassium 4.3; Sodium 137     ASSESSMENT AND PLAN:  1.  Dizziness:  Etiology not entirely clear.  Orthostatic VS do not demonstrate a significant BP drop  today.  HR is actually lower than reading on ECG.  Significance of this is unclear - suspect related to device used to obtain HR.  He has a ? R carotid bruit on exam and could certainly have cerebrovascular disease.  He has a hx of mild dizziness in the past that would resolve with stretching ("popping") his neck.  He could have cervicogenic dizziness causing his symptoms.  No signs or symptoms of vertigo.  He is not having near syncope.  BP in both arms is equal.      -  Continue to hold Ramipril for now.  Get BP check with RN in 1 week.  If symptoms no different and BP increasing, resume Ramipril.  If symptoms better, remain off Ramipril.    -  Obtain 48 Hr Holter.  If significant bradycardia, will need reduced dose of beta blocker.     -  Get carotid dopplers to rule out carotid and vertebral artery stenosis.    -  Arrange FU with PCP to assess for other causes of dizziness.  2.  Coronary artery disease:  No angina.  Continue amlodipine, Plavix, nitrates, Toprol-XL.   3.  Essential hypertension:  BP at target by my check.  As noted, remain off of Ramipril for now and check BP with RN in 1 week.  4.  Pulmonary embolus- Oct 2015:  Continue Eliquis, which he is tolerating.  BMET, CBC pending from today.     Current medicines are reviewed at length with the patient today.  The patient does not have concerns regarding medicines.  The following changes have been made:  As above.   Labs/ tests ordered today include:  No orders of the defined types were placed in this encounter.    Disposition:   FU with     Jenkins Rouge

## 2014-09-13 ENCOUNTER — Ambulatory Visit (INDEPENDENT_AMBULATORY_CARE_PROVIDER_SITE_OTHER): Payer: Medicare Other | Admitting: Cardiovascular Disease

## 2014-09-13 ENCOUNTER — Encounter: Payer: Self-pay | Admitting: Cardiovascular Disease

## 2014-09-13 VITALS — BP 122/68 | HR 72 | Ht 69.0 in | Wt 224.4 lb

## 2014-09-13 DIAGNOSIS — I10 Essential (primary) hypertension: Secondary | ICD-10-CM

## 2014-09-13 DIAGNOSIS — I2699 Other pulmonary embolism without acute cor pulmonale: Secondary | ICD-10-CM | POA: Diagnosis not present

## 2014-09-13 DIAGNOSIS — I2510 Atherosclerotic heart disease of native coronary artery without angina pectoris: Secondary | ICD-10-CM | POA: Diagnosis not present

## 2014-09-13 DIAGNOSIS — I2583 Coronary atherosclerosis due to lipid rich plaque: Secondary | ICD-10-CM

## 2014-09-13 DIAGNOSIS — E785 Hyperlipidemia, unspecified: Secondary | ICD-10-CM

## 2014-09-13 DIAGNOSIS — E118 Type 2 diabetes mellitus with unspecified complications: Secondary | ICD-10-CM | POA: Diagnosis not present

## 2014-09-13 NOTE — Patient Instructions (Signed)
Your physician wants you to follow-up in:  6 MONTHS WITH DR NISHAN  You will receive a reminder letter in the mail two months in advance. If you don't receive a letter, please call our office to schedule the follow-up appointment. Your physician recommends that you continue on your current medications as directed. Please refer to the Current Medication list given to you today. 

## 2014-09-13 NOTE — Assessment & Plan Note (Signed)
Stable with no angina and good activity level.  Continue medical Rx  

## 2014-09-13 NOTE — Assessment & Plan Note (Signed)
Cholesterol is at goal.  Continue current dose of statin and diet Rx.  No myalgias or side effects.  F/U  LFT's in 6 months. Lab Results  Component Value Date   LDLCALC * 08/03/2007    104        Total Cholesterol/HDL:CHD Risk Coronary Heart Disease Risk Table                     Men   Women  1/2 Average Risk   3.4   3.3

## 2014-09-13 NOTE — Assessment & Plan Note (Signed)
Well controlled.  Continue current medications and low sodium Dash type diet.   Dizzyness improved off ACE

## 2014-09-13 NOTE — Assessment & Plan Note (Signed)
No dyspnea  On eliquis no bleeding issues stable

## 2014-09-13 NOTE — Assessment & Plan Note (Signed)
Discussed low carb diet.  Target hemoglobin A1c is 6.5 or less.  Continue current medications.  

## 2014-09-21 DIAGNOSIS — G5721 Lesion of femoral nerve, right lower limb: Secondary | ICD-10-CM | POA: Diagnosis not present

## 2014-09-21 DIAGNOSIS — G894 Chronic pain syndrome: Secondary | ICD-10-CM | POA: Diagnosis not present

## 2014-09-21 DIAGNOSIS — M150 Primary generalized (osteo)arthritis: Secondary | ICD-10-CM | POA: Diagnosis not present

## 2014-09-21 DIAGNOSIS — Z79891 Long term (current) use of opiate analgesic: Secondary | ICD-10-CM | POA: Diagnosis not present

## 2014-10-18 ENCOUNTER — Other Ambulatory Visit: Payer: Self-pay

## 2014-10-18 MED ORDER — ISOSORBIDE MONONITRATE ER 60 MG PO TB24: 60.0000 mg | ORAL_TABLET | Freq: Every day | ORAL | Status: DC

## 2014-10-19 DIAGNOSIS — G5721 Lesion of femoral nerve, right lower limb: Secondary | ICD-10-CM | POA: Diagnosis not present

## 2014-10-19 DIAGNOSIS — G894 Chronic pain syndrome: Secondary | ICD-10-CM | POA: Diagnosis not present

## 2014-10-19 DIAGNOSIS — M150 Primary generalized (osteo)arthritis: Secondary | ICD-10-CM | POA: Diagnosis not present

## 2014-10-19 DIAGNOSIS — Z79891 Long term (current) use of opiate analgesic: Secondary | ICD-10-CM | POA: Diagnosis not present

## 2014-11-16 DIAGNOSIS — M150 Primary generalized (osteo)arthritis: Secondary | ICD-10-CM | POA: Diagnosis not present

## 2014-11-16 DIAGNOSIS — Z79891 Long term (current) use of opiate analgesic: Secondary | ICD-10-CM | POA: Diagnosis not present

## 2014-11-16 DIAGNOSIS — G894 Chronic pain syndrome: Secondary | ICD-10-CM | POA: Diagnosis not present

## 2014-11-16 DIAGNOSIS — G5721 Lesion of femoral nerve, right lower limb: Secondary | ICD-10-CM | POA: Diagnosis not present

## 2014-12-11 DIAGNOSIS — E663 Overweight: Secondary | ICD-10-CM | POA: Diagnosis not present

## 2014-12-11 DIAGNOSIS — M545 Low back pain: Secondary | ICD-10-CM | POA: Diagnosis not present

## 2014-12-11 DIAGNOSIS — E1165 Type 2 diabetes mellitus with hyperglycemia: Secondary | ICD-10-CM | POA: Diagnosis not present

## 2014-12-11 DIAGNOSIS — E785 Hyperlipidemia, unspecified: Secondary | ICD-10-CM | POA: Diagnosis not present

## 2014-12-11 DIAGNOSIS — Z125 Encounter for screening for malignant neoplasm of prostate: Secondary | ICD-10-CM | POA: Diagnosis not present

## 2014-12-11 DIAGNOSIS — I499 Cardiac arrhythmia, unspecified: Secondary | ICD-10-CM | POA: Diagnosis not present

## 2014-12-11 DIAGNOSIS — Z1389 Encounter for screening for other disorder: Secondary | ICD-10-CM | POA: Diagnosis not present

## 2014-12-11 DIAGNOSIS — E291 Testicular hypofunction: Secondary | ICD-10-CM | POA: Diagnosis not present

## 2014-12-11 DIAGNOSIS — I2699 Other pulmonary embolism without acute cor pulmonale: Secondary | ICD-10-CM | POA: Diagnosis not present

## 2014-12-11 DIAGNOSIS — Z6834 Body mass index (BMI) 34.0-34.9, adult: Secondary | ICD-10-CM | POA: Diagnosis not present

## 2014-12-11 DIAGNOSIS — Z0000 Encounter for general adult medical examination without abnormal findings: Secondary | ICD-10-CM | POA: Diagnosis not present

## 2014-12-11 DIAGNOSIS — I10 Essential (primary) hypertension: Secondary | ICD-10-CM | POA: Diagnosis not present

## 2014-12-28 DIAGNOSIS — M150 Primary generalized (osteo)arthritis: Secondary | ICD-10-CM | POA: Diagnosis not present

## 2014-12-28 DIAGNOSIS — G894 Chronic pain syndrome: Secondary | ICD-10-CM | POA: Diagnosis not present

## 2014-12-28 DIAGNOSIS — G5721 Lesion of femoral nerve, right lower limb: Secondary | ICD-10-CM | POA: Diagnosis not present

## 2014-12-28 DIAGNOSIS — Z79891 Long term (current) use of opiate analgesic: Secondary | ICD-10-CM | POA: Diagnosis not present

## 2015-01-23 ENCOUNTER — Telehealth: Payer: Self-pay | Admitting: Cardiovascular Disease

## 2015-01-23 MED ORDER — APIXABAN 5 MG PO TABS: 5.0000 mg | ORAL_TABLET | Freq: Two times a day (BID) | ORAL | Status: DC

## 2015-01-23 NOTE — Telephone Encounter (Signed)
Needs to be on eliquis until at least end of October  Call in refill

## 2015-01-23 NOTE — Telephone Encounter (Signed)
Pt would like to know if he needs to continue take Eliquis medication. Pt states that Dr. Johnsie Cancel said  that he would take this medication for 6 months to a year. Pt has been taken this medication since October 2015. Pt is almost out of the medication. He  needs to know if he can stop taking it before he refill the medication.

## 2015-01-23 NOTE — Telephone Encounter (Signed)
Pt c/o medication issue: 1. Name of Medication: Eliquis   4. What is your medication issue? Pt states that he has been on Eliquis for years. Requests a call back to determine if it is ok for him to stop taking the medication

## 2015-01-23 NOTE — Telephone Encounter (Signed)
Pt is aware that Dr Johnsie Cancel recommends to continue taken Eliquis 5 mg  Until the end of October 2016. Prescription refill  For Eliquis 5 mg one tablet twice a day send electronically to [facility]. Pt is aware . Pt asked to have samples of Eliquis 5 mg because he is a the donut hole. A 56 samples pills/ 28 days supply placed at the front desk for pt at the front desk. Pt is aware.

## 2015-02-02 ENCOUNTER — Other Ambulatory Visit: Payer: Self-pay

## 2015-02-02 ENCOUNTER — Other Ambulatory Visit: Payer: Self-pay | Admitting: *Deleted

## 2015-02-02 MED ORDER — AMLODIPINE BESYLATE 10 MG PO TABS: 10.0000 mg | ORAL_TABLET | Freq: Every day | ORAL | Status: DC

## 2015-02-23 DIAGNOSIS — Z23 Encounter for immunization: Secondary | ICD-10-CM | POA: Diagnosis not present

## 2015-03-05 DIAGNOSIS — Z23 Encounter for immunization: Secondary | ICD-10-CM | POA: Diagnosis not present

## 2015-03-12 NOTE — Progress Notes (Signed)
Patient ID: Carlos Herman, male   DOB: 1941-01-17, 74 y.o.   MRN: [id number]    Cardiology Office Note   Date:  03/15/2015   ID:  Carlos Herman, DOB 1941-04-25, MRN [id number]  PCP:  Kandice Hams, MD  Cardiologist:  Dr. Jenkins Rouge     No chief complaint on file.    History of Present Illness: Carlos Herman is a 74 y.o. male with a hx of CAD s/p CABG in 1996, DM, HTN, HLD.  Cath in 2009 with patent RCA with diffuse stenting, occluded Circumflex and LAD with patent SVG to Diagonal filling LAD but occluded SVG to OM and SVG to RCA. He has been on anti-coagulation with Eliquis for bilateral PE dx'd in October 2015.  Patient was seen in the office in December 2015 for chest pain c/w angina and set up for cardiac cath.  LHC revealed significant disease in the native RCA. He underwent PCI with DES to the mid RCA and cutting balloon to the distal RCA. The was placed on Eliquis and Plavix for a year- no ASA.  Last seen by PA 2/16 .  Complaints of dizziness.  BP reportedly in 160/70s.  He was advised to hold Ramipril and to FU today (needs BMET, CBC).  The patient notes dizziness/lightheadedness over the past 2-3 weeks.  It seems to be hard for him to describe.  He states it is like a "buzzing feeling."  It seems fairly constant.  But, he tends to notice it more when he is upright and moving around.  He denies a spinning sensation.  He denies near syncope or syncope.  He denies any change with change in head position or use of his upper extremities.  He denies chest pain or significant dyspnea.  This remains improved since his PCI.  He is adherent to his medications.  He denies orthopnea, PND.  He has chronic LE edema on the R related to prior injuries/surgeries.  He denies any significant change.  Actually, he feels this has improved since he had his recent PCI.    Ramapril stopped and BP has not been too high Holter reviewed and PAC/PVC no afib Carotid reviewed 08/19/14 and normal with  left dominant vertebral  ON eliquis for previous PE 2015 stop in October 2016    Studies/Reports Reviewed Today:  Myoview 02/2009 Previous inferolateral infarct with very mild overlying ischemia. Low Risk  Echo 04/10/14 - Mild LVH. EF 55% to 60%. Wall motion was normal - Aorta: Aortic root dimension: 39 mm (ED). - Aortic root: The aortic root is top normal in size. - Mitral valve: Mild regurgitation. - Left atrium: Mildly dilated (36 ml/m2).  Cardiac Cath/PCI 06/16/14 LAD:  prox occluded, mid and dist fills from patent S-Dx LCx:  known to be a diffusely diseased, small vessel and not favorable for PCI during last cath.  RCA:  patent stents in the proximal, mid and distal vessel. 40% beyond prox stented segment, 80% ISR mid stent, 80% ISR dist stent. RV marginal br 99% >> PCI: 3.0 x 20 mm Promus Premier DES in the mid RCA; POBA/cutting balloon x 2 to dist RCA S-OM occluded (old)  S-RCA occluded (old) S-Dx patent filling the LAD - distal segment of Dx branch newly occluded.  L-LAD small and atretic (old)  EF=55-60%   Past Medical History  Diagnosis Date  . Depression   . Osteoarthritis   . Stroke \  . Dyslipidemia   . DM (diabetes mellitus)   .  Nephrolithiasis   . Obesity     morbid  . Leg edema     right  . Hypertension   . CAD (coronary artery disease)     a. s/p CABG; b. s/p diffuse stenting to RCA; c. 3/4 grafts known to be occluded;  d.  LHC 12/15:  pLAD 100, small CFx diff disz - no amenable to PCI, RCA severe mid and dist ISR, S-OM, S-RCA 100, L-LAD atretic, S-Dx ok (dist seg of Dx 100) >> PCI:  DES to mRCA and POBA/cutting balloon to dist RCA   . Hemorrhoids     internal  . Hx of echocardiogram     a. Echo 10/15:  mild LVH, EF 55-60%, no RWMA, Ao root 39 mm, mild MR, mild LAE  . Pulmonary embolism     dx 03/2014 >> On Apixaban  . History of Doppler ultrasound     Carotid US (2/16):  Normal carotid arteries bilaterally.    Past Surgical History  Procedure  Laterality Date  . Heart bypass  1996  . Angioplasty / stenting iliac    . Total knee arthroplasty      right  . Left heart catheterization with coronary/graft angiogram N/A 06/16/2014    Procedure: LEFT HEART CATHETERIZATION WITH Beatrix Fetters;  Surgeon: Burnell Blanks, MD;  Location: [facility] CATH LAB;  Service: Cardiovascular;  Laterality: N/A;  . Percutaneous coronary stent intervention (pci-s)  06/16/2014    Procedure: PERCUTANEOUS CORONARY STENT INTERVENTION (PCI-S);  Surgeon: Burnell Blanks, MD;  Location: [facility] CATH LAB;  Service: Cardiovascular;;  MID RCA, Cutting Balloon and Stent Distal RCA, Cutting Balloon      Current Outpatient Prescriptions  Medication Sig Dispense Refill  . amLODipine (NORVASC) 10 MG tablet Take 1 tablet (10 mg total) by mouth at bedtime. 90 tablet 3  . apixaban (ELIQUIS) 5 MG TABS tablet Take 1 tablet (5 mg total) by mouth 2 (two) times daily. 60 tablet 6  . clopidogrel (PLAVIX) 75 MG tablet Take 1 tablet (75 mg total) by mouth daily with breakfast. 30 tablet 11  . glimepiride (AMARYL) 2 MG tablet Take 2 mg by mouth daily.  0  . isosorbide mononitrate (IMDUR) 60 MG 24 hr tablet Take 1 tablet (60 mg total) by mouth at bedtime. 90 tablet 1  . metFORMIN (GLUCOPHAGE) 500 MG tablet Take 2 tablets (1,000 mg total) by mouth 2 (two) times daily with a meal.    . metoprolol succinate (TOPROL-XL) 100 MG 24 hr tablet Take with or immediately following a meal: 1 tablet (100 mg) every morning and 1/2 tablet (50 mg) every night 135 tablet 3  . nitroGLYCERIN (NITROSTAT) 0.4 MG SL tablet Place 1 tablet (0.4 mg total) under the tongue every 5 (five) minutes as needed for chest pain. 25 tablet 4  . NON FORMULARY Take 1 tablet by mouth daily. Take daily () Vitamin D3 2000 mg, Calcium 333 mg, magnesium 133 mg, zinc 5mg .)    . oxyCODONE-acetaminophen (PERCOCET) 10-325 MG per tablet Take 1 tablet by mouth every 4 (four) hours as needed for pain.   0   No  current facility-administered medications for this visit.    Allergies:   Penicillins and Statins    Social History:  The patient  reports that he has never smoked. He has never used smokeless tobacco. He reports that he drinks alcohol. He reports that he does not use illicit drugs.   Family History:  The patient's family history includes Diabetes in his mother;  Stroke in his mother. There is no history of Heart attack.    ROS:  Please see the history of present illness.   He fell recently and bruised his coccyx.  He was seen by Dr. Nicholaus Bloom (pain doctor) and did not require xrays.  He says he tripped - R leg is weak related to prior injuries/surgeries.  Otherwise, review of systems are positive for none.   All other systems are reviewed and negative.    PHYSICAL EXAM: VS:  BP 132/70 mmHg  Pulse 66  Ht 5\' 9"  (1.753 m)  Wt 108.41 kg (239 lb)  BMI 35.28 kg/m2  SpO2 97%   Repeat BP by me:  L 126/60;  R 130/60  No data found.   Wt Readings from Last 3 Encounters:  03/15/15 108.41 kg (239 lb)  09/13/14 101.787 kg (224 lb 6.4 oz)  08/18/14 103.329 kg (227 lb 12.8 oz)     GEN: Well nourished, well developed, in no acute distress HEENT: normalno obvious nystagmus with lying flat Neck: I cannot appreciate JVD, ? R carotid vs supraclavicular bruit, no masses Cardiac:  Normal M5/H8, RRR; 1/6 systolic murmur LLSB, no rubs or gallops, 1+ RLE edema, trace LLE edema  Respiratory:  clear to auscultation bilaterally, no wheezing, rhonchi or rales. GI: soft, nontender, nondistended, + BS MS: no deformity or atrophy Skin: warm and dry  Neuro:  CNs II-XII intact, Strength and sensation are intact Psych: Normal affect   EKG:  08/10/14    NSR, HR 69, normal axis, PACs, no change from prior tracing. Done 07/16/14    Recent Labs: 08/10/2014: BUN 14; Creatinine, Ser 0.92; Hemoglobin 13.4; Platelets 256.0; Potassium 4.3; Sodium 137     ASSESSMENT AND PLAN:  1.  Dizziness:  Etiology not  entirely clear.  Orthostatic VS do not demonstrate a significant BP drop today.  HR is actually lower than reading on ECG.  Significance of this is unclear - suspect related to device used to obtain HR.  He has a ? R carotid bruit on exam and could certainly have cerebrovascular disease.  He has a hx of mild dizziness in the past that would resolve with stretching ("popping") his neck.  He could have cervicogenic dizziness causing his symptoms.  No signs or symptoms of vertigo.  He is not having near syncope.  BP in both arms is equal.      -  Continue to hold Ramipril for now.  Get BP check with RN in 1 week.  If symptoms no different and BP increasing, resume Ramipril.  If symptoms better, remain off Ramipril.    -  Holter PAC;s PVC;s no PAF      -  Carotids 07/2014 no disease . 2.  Coronary artery disease:  No angina.  Continue amlodipine, Plavix, nitrates, Toprol-XL.   3.  Essential hypertension:  BP at target by my check.  As noted, remain off of Ramipril for now and follow BP 4.  Pulmonary embolus- Oct 2015:  Continue Eliquis until October 2016 then resume 81 mg ASA with Plavix for his CAD     Current medicines are reviewed at length with the patient today.  The patient does not have concerns regarding medicines.  The following changes have been made:  As above.   Labs/ tests ordered today include:  No orders of the defined types were placed in this encounter.    Disposition:   FU with  Me in 6 months    Jenkins Rouge

## 2015-03-13 DIAGNOSIS — X32XXXA Exposure to sunlight, initial encounter: Secondary | ICD-10-CM | POA: Diagnosis not present

## 2015-03-13 DIAGNOSIS — D225 Melanocytic nevi of trunk: Secondary | ICD-10-CM | POA: Diagnosis not present

## 2015-03-13 DIAGNOSIS — L570 Actinic keratosis: Secondary | ICD-10-CM | POA: Diagnosis not present

## 2015-03-13 DIAGNOSIS — L708 Other acne: Secondary | ICD-10-CM | POA: Diagnosis not present

## 2015-03-15 ENCOUNTER — Ambulatory Visit (INDEPENDENT_AMBULATORY_CARE_PROVIDER_SITE_OTHER): Payer: Medicare Other | Admitting: Cardiovascular Disease

## 2015-03-15 ENCOUNTER — Encounter: Payer: Self-pay | Admitting: Cardiovascular Disease

## 2015-03-15 VITALS — BP 132/70 | HR 66 | Ht 69.0 in | Wt 239.0 lb

## 2015-03-15 DIAGNOSIS — I2583 Coronary atherosclerosis due to lipid rich plaque: Principal | ICD-10-CM

## 2015-03-15 DIAGNOSIS — I2510 Atherosclerotic heart disease of native coronary artery without angina pectoris: Secondary | ICD-10-CM

## 2015-03-15 NOTE — Patient Instructions (Signed)
Medication Instructions:  Your physician recommends that you continue on your current medications as directed. Please refer to the Current Medication list given to you today.  Labwork: none  Testing/Procedures: NONE  Follow-Up: Your physician wants you to follow-up in:   [place] will receive a reminder letter in the mail two months in advance. If you don't receive a letter, please call our office to schedule the follow-up appointment.   Any Other Special Instructions Will Be Listed Below (If Applicable).

## 2015-03-29 DIAGNOSIS — Z79891 Long term (current) use of opiate analgesic: Secondary | ICD-10-CM | POA: Diagnosis not present

## 2015-03-29 DIAGNOSIS — G5721 Lesion of femoral nerve, right lower limb: Secondary | ICD-10-CM | POA: Diagnosis not present

## 2015-03-29 DIAGNOSIS — G894 Chronic pain syndrome: Secondary | ICD-10-CM | POA: Diagnosis not present

## 2015-03-29 DIAGNOSIS — M150 Primary generalized (osteo)arthritis: Secondary | ICD-10-CM | POA: Diagnosis not present

## 2015-04-13 ENCOUNTER — Other Ambulatory Visit: Payer: Self-pay

## 2015-04-13 MED ORDER — ISOSORBIDE MONONITRATE ER 60 MG PO TB24: 60.0000 mg | ORAL_TABLET | Freq: Every day | ORAL | Status: DC

## 2015-05-22 DIAGNOSIS — Z79891 Long term (current) use of opiate analgesic: Secondary | ICD-10-CM | POA: Diagnosis not present

## 2015-05-22 DIAGNOSIS — G894 Chronic pain syndrome: Secondary | ICD-10-CM | POA: Diagnosis not present

## 2015-05-22 DIAGNOSIS — M150 Primary generalized (osteo)arthritis: Secondary | ICD-10-CM | POA: Diagnosis not present

## 2015-05-22 DIAGNOSIS — G5721 Lesion of femoral nerve, right lower limb: Secondary | ICD-10-CM | POA: Diagnosis not present

## 2015-05-30 ENCOUNTER — Ambulatory Visit
Admission: RE | Admit: 2015-05-30 | Discharge: 2015-05-30 | Disposition: A | Discharge status: Home / Self Care | Payer: Medicare Other | Source: Ambulatory Visit | Attending: Anesthesiology | Admitting: Anesthesiology

## 2015-05-30 ENCOUNTER — Other Ambulatory Visit: Payer: Self-pay | Admitting: Anesthesiology

## 2015-05-30 DIAGNOSIS — M25551 Pain in right hip: Secondary | ICD-10-CM

## 2015-05-30 DIAGNOSIS — Z96641 Presence of right artificial hip joint: Secondary | ICD-10-CM | POA: Diagnosis not present

## 2015-05-30 DIAGNOSIS — G8929 Other chronic pain: Secondary | ICD-10-CM

## 2015-05-30 DIAGNOSIS — M25571 Pain in right ankle and joints of right foot: Principal | ICD-10-CM

## 2015-05-30 DIAGNOSIS — M19071 Primary osteoarthritis, right ankle and foot: Secondary | ICD-10-CM | POA: Diagnosis not present

## 2015-06-12 DIAGNOSIS — I739 Peripheral vascular disease, unspecified: Secondary | ICD-10-CM | POA: Diagnosis not present

## 2015-06-12 DIAGNOSIS — E0851 Diabetes mellitus due to underlying condition with diabetic peripheral angiopathy without gangrene: Secondary | ICD-10-CM | POA: Diagnosis not present

## 2015-06-12 DIAGNOSIS — E1340 Other specified diabetes mellitus with diabetic neuropathy, unspecified: Secondary | ICD-10-CM | POA: Diagnosis not present

## 2015-06-12 DIAGNOSIS — M545 Low back pain: Secondary | ICD-10-CM | POA: Diagnosis not present

## 2015-06-12 DIAGNOSIS — E785 Hyperlipidemia, unspecified: Secondary | ICD-10-CM | POA: Diagnosis not present

## 2015-06-12 DIAGNOSIS — M949 Disorder of cartilage, unspecified: Secondary | ICD-10-CM | POA: Diagnosis not present

## 2015-06-12 DIAGNOSIS — E119 Type 2 diabetes mellitus without complications: Secondary | ICD-10-CM | POA: Diagnosis not present

## 2015-06-12 DIAGNOSIS — Z7984 Long term (current) use of oral hypoglycemic drugs: Secondary | ICD-10-CM | POA: Diagnosis not present

## 2015-06-20 DIAGNOSIS — M150 Primary generalized (osteo)arthritis: Secondary | ICD-10-CM | POA: Diagnosis not present

## 2015-06-20 DIAGNOSIS — G894 Chronic pain syndrome: Secondary | ICD-10-CM | POA: Diagnosis not present

## 2015-06-20 DIAGNOSIS — G5721 Lesion of femoral nerve, right lower limb: Secondary | ICD-10-CM | POA: Diagnosis not present

## 2015-06-20 DIAGNOSIS — Z79891 Long term (current) use of opiate analgesic: Secondary | ICD-10-CM | POA: Diagnosis not present

## 2015-06-27 ENCOUNTER — Other Ambulatory Visit: Payer: Self-pay | Admitting: Cardiovascular Disease

## 2015-06-27 MED ORDER — CLOPIDOGREL BISULFATE 75 MG PO TABS: 75.0000 mg | ORAL_TABLET | Freq: Every day | ORAL | Status: DC

## 2015-07-30 ENCOUNTER — Other Ambulatory Visit: Payer: Self-pay | Admitting: Cardiovascular Disease

## 2015-08-15 DIAGNOSIS — Z79891 Long term (current) use of opiate analgesic: Secondary | ICD-10-CM | POA: Diagnosis not present

## 2015-08-15 DIAGNOSIS — G894 Chronic pain syndrome: Secondary | ICD-10-CM | POA: Diagnosis not present

## 2015-08-15 DIAGNOSIS — M150 Primary generalized (osteo)arthritis: Secondary | ICD-10-CM | POA: Diagnosis not present

## 2015-08-15 DIAGNOSIS — G5721 Lesion of femoral nerve, right lower limb: Secondary | ICD-10-CM | POA: Diagnosis not present

## 2015-08-27 ENCOUNTER — Other Ambulatory Visit: Payer: Self-pay | Admitting: Cardiovascular Disease

## 2015-09-17 ENCOUNTER — Encounter: Payer: Self-pay | Admitting: Cardiovascular Disease

## 2015-09-17 NOTE — Progress Notes (Signed)
Patient ID: Carlos Herman, male   DOB: 1940-11-13, 75 y.o.   MRN: [id number]    Cardiology Office Note   Date:  09/21/2015   ID:  Carlos Herman, DOB [birth year], MRN [id number]  PCP:  Kandice Hams, MD  Cardiologist:  Dr. Jenkins Rouge     Chief Complaint  Patient presents with  . Hypertension    no sx     History of Present Illness: Carlos Herman is a 75 y.o. male with a hx of CAD s/p CABG in 1996, DM, HTN, HLD.  Cath in 2009 with patent RCA with diffuse stenting, occluded Circumflex and LAD with patent SVG to Diagonal filling LAD but occluded SVG to OM and SVG to RCA. He has been on anti-coagulation with Eliquis for bilateral PE dx'd in October 2015.  Patient was seen in the office in December 2015 for chest pain c/w angina and set up for cardiac cath.  LHC revealed significant disease in the native RCA. He underwent PCI with DES to the mid RCA and cutting balloon to the distal RCA. The was placed on Eliquis and Plavix for a year- no ASA.  Last seen by PA 2/16 .  Complaints of dizziness.  BP reportedly in 160/70s.  He was advised to hold Ramipril and to FU today (needs BMET, CBC).  The patient notes dizziness/lightheadedness .  It seems to be hard for him to describe.  He states it is like a "buzzing feeling."  It seems fairly constant.  But, he tends to notice it more when he is upright and moving around.  He denies a spinning sensation.  He denies near syncope or syncope.  He denies any change with change in head position or use of his upper extremities.  He denies chest pain or significant dyspnea.  This remains improved since his PCI.  He is adherent to his medications.  He denies orthopnea, PND.  He has chronic LE edema on the R related to prior injuries/surgeries.  He denies any significant change.  Actually, he feels this has improved since he had his recent PCI.    Ramapril stopped and BP has not been too high Holter reviewed and PAC/PVC no afib Carotid reviewed 08/19/14  and normal with left dominant vertebral  ON eliquis for previous PE 2015 stop in October 2016  No angina busy doing major Spring races at [place] Major limitation to activity is right knee pain post TKR   Studies/Reports Reviewed Today:  Myoview 02/2009 Previous inferolateral infarct with very mild overlying ischemia. Low Risk  Echo 04/10/14 - Mild LVH. EF 55% to 60%. Wall motion was normal - Aorta: Aortic root dimension: 39 mm (ED). - Aortic root: The aortic root is top normal in size. - Mitral valve: Mild regurgitation. - Left atrium: Mildly dilated (36 ml/m2).  Cardiac Cath/PCI 06/16/14 LAD:  prox occluded, mid and dist fills from patent S-Dx LCx:  known to be a diffusely diseased, small vessel and not favorable for PCI during last cath.  RCA:  patent stents in the proximal, mid and distal vessel. 40% beyond prox stented segment, 80% ISR mid stent, 80% ISR dist stent. RV marginal br 99% >> PCI: 3.0 x 20 mm Promus Premier DES in the mid RCA; POBA/cutting balloon x 2 to dist RCA S-OM occluded (old)  S-RCA occluded (old) S-Dx patent filling the LAD - distal segment of Dx branch newly occluded.  L-LAD small and atretic (old)  EF=55-60%   Past Medical History  Diagnosis  Date  . Depression   . Osteoarthritis   . Stroke ([place]) \  . Dyslipidemia   . DM (diabetes mellitus) ([place])   . Nephrolithiasis   . Obesity     morbid  . Leg edema     right  . Hypertension   . CAD (coronary artery disease)     a. s/p CABG; b. s/p diffuse stenting to RCA; c. 3/4 grafts known to be occluded;  d.  LHC 12/15:  pLAD 100, small CFx diff disz - no amenable to PCI, RCA severe mid and dist ISR, S-OM, S-RCA 100, L-LAD atretic, S-Dx ok (dist seg of Dx 100) >> PCI:  DES to mRCA and POBA/cutting balloon to dist RCA   . Hemorrhoids     internal  . Hx of echocardiogram     a. Echo 10/15:  mild LVH, EF 55-60%, no RWMA, Ao root 39 mm, mild MR, mild LAE  . Pulmonary embolism ([place])     dx 03/2014 >> On  Apixaban  . History of Doppler ultrasound     Carotid US (2/16):  Normal carotid arteries bilaterally.    Past Surgical History  Procedure Laterality Date  . Heart bypass  1996  . Angioplasty / stenting iliac    . Total knee arthroplasty      right  . Left heart catheterization with coronary/graft angiogram N/A 06/16/2014    Procedure: LEFT HEART CATHETERIZATION WITH Beatrix Fetters;  Surgeon: Burnell Blanks, MD;  Location: [facility] CATH LAB;  Service: Cardiovascular;  Laterality: N/A;  . Percutaneous coronary stent intervention (pci-s)  06/16/2014    Procedure: PERCUTANEOUS CORONARY STENT INTERVENTION (PCI-S);  Surgeon: Burnell Blanks, MD;  Location: [facility] CATH LAB;  Service: Cardiovascular;;  MID RCA, Cutting Balloon and Stent Distal RCA, Cutting Balloon      Current Outpatient Prescriptions  Medication Sig Dispense Refill  . amLODipine (NORVASC) 10 MG tablet Take 1 tablet (10 mg total) by mouth at bedtime. 90 tablet 3  . clopidogrel (PLAVIX) 75 MG tablet Take 1 tablet (75 mg total) by mouth daily with breakfast. 90 tablet 3  . glimepiride (AMARYL) 2 MG tablet Take 2 mg by mouth daily.  0  . isosorbide mononitrate (IMDUR) 60 MG 24 hr tablet Take 1 tablet (60 mg total) by mouth at bedtime. 90 tablet 3  . metFORMIN (GLUCOPHAGE) 500 MG tablet Take 2 tablets (1,000 mg total) by mouth 2 (two) times daily with a meal.    . metoprolol succinate (TOPROL-XL) 100 MG 24 hr tablet TAKE 1 TABLET BY MOUTH IN THE AM & 1/2 TABLET BY MOUTH IN THE PM    . nitroGLYCERIN (NITROSTAT) 0.4 MG SL tablet Place 1 tablet (0.4 mg total) under the tongue every 5 (five) minutes as needed for chest pain. 25 tablet 4  . NON FORMULARY Take 1 tablet by mouth daily. Take daily () Vitamin D3 2000 mg, Calcium 333 mg, magnesium 133 mg, zinc 5mg .)    . oxyCODONE-acetaminophen (PERCOCET) 10-325 MG per tablet Take 1 tablet by mouth every 4 (four) hours as needed for pain.   0   No current  facility-administered medications for this visit.    Allergies:   Penicillins and Statins    Social History:  The patient  reports that he has never smoked. He has never used smokeless tobacco. He reports that he drinks alcohol. He reports that he does not use illicit drugs.   Family History:  The patient's family history includes Diabetes in his mother;  Stroke in his mother. There is no history of Heart attack.    ROS:  Please see the history of present illness.   He fell recently and bruised his coccyx.  He was seen by Dr. Nicholaus Bloom (pain doctor) and did not require xrays.  He says he tripped - R leg is weak related to prior injuries/surgeries.  Otherwise, review of systems are positive for none.   All other systems are reviewed and negative.    PHYSICAL EXAM: VS:  BP 120/60 mmHg  Pulse 69  Ht 5\' 9"  (1.753 m)  Wt 106.505 kg (234 lb 12.8 oz)  BMI 34.66 kg/m2  SpO2 93%   Repeat BP by me:  L 126/60;  R 130/60  No data found.   Wt Readings from Last 3 Encounters:  09/21/15 106.505 kg (234 lb 12.8 oz)  03/15/15 108.41 kg (239 lb)  09/13/14 101.787 kg (224 lb 6.4 oz)     GEN: Well nourished, well developed, in no acute distress HEENT: normalno obvious nystagmus with lying flat Neck: I cannot appreciate JVD, ? R carotid vs supraclavicular bruit, no masses Cardiac:  Normal [id number], RRR; 1/6 systolic murmur LLSB, no rubs or gallops, 1+ RLE edema, trace LLE edema  Respiratory:  clear to auscultation bilaterally, no wheezing, rhonchi or rales. GI: soft, nontender, nondistended, + BS MS: no deformity or atrophy Skin: warm and dry  Neuro:  CNs II-XII intact, Strength and sensation are intact Psych: Normal affect   EKG:  08/10/14    NSR, HR 69, normal axis, PACs, no change from prior tracing. Done 07/16/14  09/21/15  SR rate 69 PVC    Recent Labs: No results found for requested labs within last 365 days.     ASSESSMENT AND PLAN:  1.  Dizziness:  Resolved off ramapril    -   Holter PAC;s PVC;s no PAF      -  Carotids 07/2014 no disease . 2.  Coronary artery disease:  No angina.  Continue amlodipine, Plavix, nitrates, Toprol-XL.   3.  Essential hypertension:  BP at target by my check.  As noted, remain off of Ramipril for now and follow BP 4.  Pulmonary embolus- Oct 2015:  Now of eliquis on plavix for CAD  5. Ortho:  Should see pain management f/u post TKR on right    Current medicines are reviewed at length with the patient today.  The patient does not have concerns regarding medicines.  The following changes have been made:  As above.   Labs/ tests ordered today include:  Orders Placed This Encounter  Procedures  . EKG 12-Lead    Disposition:   FU with  Me in 6 months    Jenkins Rouge

## 2015-09-21 ENCOUNTER — Encounter: Payer: Self-pay | Admitting: Cardiovascular Disease

## 2015-09-21 ENCOUNTER — Ambulatory Visit (INDEPENDENT_AMBULATORY_CARE_PROVIDER_SITE_OTHER): Payer: Medicare Other | Admitting: Cardiovascular Disease

## 2015-09-21 VITALS — BP 120/60 | HR 69 | Ht 69.0 in | Wt 234.8 lb

## 2015-09-21 DIAGNOSIS — I10 Essential (primary) hypertension: Secondary | ICD-10-CM | POA: Diagnosis not present

## 2015-09-21 MED ORDER — CLOPIDOGREL BISULFATE 75 MG PO TABS: 75.0000 mg | ORAL_TABLET | Freq: Every day | ORAL | Status: DC

## 2015-09-21 NOTE — Patient Instructions (Signed)

## 2015-10-10 DIAGNOSIS — M150 Primary generalized (osteo)arthritis: Secondary | ICD-10-CM | POA: Diagnosis not present

## 2015-10-10 DIAGNOSIS — G894 Chronic pain syndrome: Secondary | ICD-10-CM | POA: Diagnosis not present

## 2015-10-10 DIAGNOSIS — Z79891 Long term (current) use of opiate analgesic: Secondary | ICD-10-CM | POA: Diagnosis not present

## 2015-10-10 DIAGNOSIS — G5721 Lesion of femoral nerve, right lower limb: Secondary | ICD-10-CM | POA: Diagnosis not present

## 2015-12-05 DIAGNOSIS — Z79891 Long term (current) use of opiate analgesic: Secondary | ICD-10-CM | POA: Diagnosis not present

## 2015-12-05 DIAGNOSIS — G894 Chronic pain syndrome: Secondary | ICD-10-CM | POA: Diagnosis not present

## 2015-12-05 DIAGNOSIS — G5721 Lesion of femoral nerve, right lower limb: Secondary | ICD-10-CM | POA: Diagnosis not present

## 2015-12-05 DIAGNOSIS — M150 Primary generalized (osteo)arthritis: Secondary | ICD-10-CM | POA: Diagnosis not present

## 2015-12-21 ENCOUNTER — Emergency Department (HOSPITAL_BASED_OUTPATIENT_CLINIC_OR_DEPARTMENT_OTHER)
Admit: 2015-12-21 | Discharge: 2015-12-21 | Disposition: A | Discharge status: Home / Self Care | Payer: Medicare Other | Attending: Emergency Medicine | Admitting: Emergency Medicine

## 2015-12-21 ENCOUNTER — Encounter (HOSPITAL_COMMUNITY): Payer: Self-pay | Admitting: Emergency Medicine

## 2015-12-21 ENCOUNTER — Emergency Department (HOSPITAL_COMMUNITY)
Admission: EM | Admit: 2015-12-21 | Discharge: 2015-12-21 | Disposition: A | Discharge status: Home / Self Care | Payer: Medicare Other | Attending: Emergency Medicine | Admitting: Emergency Medicine

## 2015-12-21 ENCOUNTER — Emergency Department (HOSPITAL_COMMUNITY): Payer: Medicare Other

## 2015-12-21 ENCOUNTER — Other Ambulatory Visit: Payer: Self-pay

## 2015-12-21 DIAGNOSIS — Z7982 Long term (current) use of aspirin: Secondary | ICD-10-CM | POA: Insufficient documentation

## 2015-12-21 DIAGNOSIS — Z7984 Long term (current) use of oral hypoglycemic drugs: Secondary | ICD-10-CM | POA: Diagnosis not present

## 2015-12-21 DIAGNOSIS — R531 Weakness: Secondary | ICD-10-CM | POA: Diagnosis not present

## 2015-12-21 DIAGNOSIS — M7989 Other specified soft tissue disorders: Secondary | ICD-10-CM | POA: Insufficient documentation

## 2015-12-21 DIAGNOSIS — R29898 Other symptoms and signs involving the musculoskeletal system: Secondary | ICD-10-CM

## 2015-12-21 DIAGNOSIS — Z96651 Presence of right artificial knee joint: Secondary | ICD-10-CM | POA: Diagnosis not present

## 2015-12-21 DIAGNOSIS — E119 Type 2 diabetes mellitus without complications: Secondary | ICD-10-CM | POA: Diagnosis not present

## 2015-12-21 DIAGNOSIS — M4806 Spinal stenosis, lumbar region: Secondary | ICD-10-CM | POA: Diagnosis not present

## 2015-12-21 DIAGNOSIS — R930 Abnormal findings on diagnostic imaging of skull and head, not elsewhere classified: Secondary | ICD-10-CM | POA: Diagnosis not present

## 2015-12-21 DIAGNOSIS — Z951 Presence of aortocoronary bypass graft: Secondary | ICD-10-CM | POA: Diagnosis not present

## 2015-12-21 DIAGNOSIS — R200 Anesthesia of skin: Secondary | ICD-10-CM | POA: Diagnosis present

## 2015-12-21 DIAGNOSIS — M48061 Spinal stenosis, lumbar region without neurogenic claudication: Secondary | ICD-10-CM

## 2015-12-21 DIAGNOSIS — I2510 Atherosclerotic heart disease of native coronary artery without angina pectoris: Secondary | ICD-10-CM | POA: Insufficient documentation

## 2015-12-21 DIAGNOSIS — Z8673 Personal history of transient ischemic attack (TIA), and cerebral infarction without residual deficits: Secondary | ICD-10-CM | POA: Insufficient documentation

## 2015-12-21 DIAGNOSIS — M5124 Other intervertebral disc displacement, thoracic region: Secondary | ICD-10-CM | POA: Diagnosis not present

## 2015-12-21 DIAGNOSIS — M6281 Muscle weakness (generalized): Secondary | ICD-10-CM | POA: Diagnosis not present

## 2015-12-21 DIAGNOSIS — I10 Essential (primary) hypertension: Secondary | ICD-10-CM | POA: Diagnosis not present

## 2015-12-21 LAB — CBC WITH DIFFERENTIAL/PLATELET
Basophils Absolute: 0 10*3/uL (ref 0.0–0.1)
Basophils Relative: 0 %
EOS ABS: 0.1 10*3/uL (ref 0.0–0.7)
Eosinophils Relative: 1 %
HEMATOCRIT: 38.4 % — AB (ref 39.0–52.0)
HEMOGLOBIN: 12.4 g/dL — AB (ref 13.0–17.0)
LYMPHS ABS: 1.6 10*3/uL (ref 0.7–4.0)
Lymphocytes Relative: 20 %
MCH: 30.4 pg (ref 26.0–34.0)
MCHC: 32.3 g/dL (ref 30.0–36.0)
MCV: 94.1 fL (ref 78.0–100.0)
Monocytes Absolute: 0.7 10*3/uL (ref 0.1–1.0)
Monocytes Relative: 8 %
NEUTROS ABS: 5.8 10*3/uL (ref 1.7–7.7)
NEUTROS PCT: 71 %
Platelets: 224 10*3/uL (ref 150–400)
RBC: 4.08 MIL/uL — AB (ref 4.22–5.81)
RDW: 12.6 % (ref 11.5–15.5)
WBC: 8.3 10*3/uL (ref 4.0–10.5)

## 2015-12-21 LAB — COMPREHENSIVE METABOLIC PANEL
ALT: 16 U/L — ABNORMAL LOW (ref 17–63)
ANION GAP: 7 (ref 5–15)
AST: 18 U/L (ref 15–41)
Albumin: 3.5 g/dL (ref 3.5–5.0)
Alkaline Phosphatase: 67 U/L (ref 38–126)
BILIRUBIN TOTAL: 0.5 mg/dL (ref 0.3–1.2)
BUN: 13 mg/dL (ref 6–20)
CO2: 24 mmol/L (ref 22–32)
Calcium: 9.1 mg/dL (ref 8.9–10.3)
Chloride: 106 mmol/L (ref 101–111)
Creatinine, Ser: 1.05 mg/dL (ref 0.61–1.24)
Glucose, Bld: 117 mg/dL — ABNORMAL HIGH (ref 65–99)
POTASSIUM: 4.2 mmol/L (ref 3.5–5.1)
Sodium: 137 mmol/L (ref 135–145)
TOTAL PROTEIN: 6.2 g/dL — AB (ref 6.5–8.1)

## 2015-12-21 LAB — PROTIME-INR
INR: 1.07 (ref 0.00–1.49)
PROTHROMBIN TIME: 14.1 s (ref 11.6–15.2)

## 2015-12-21 LAB — URINALYSIS, ROUTINE W REFLEX MICROSCOPIC
Bilirubin Urine: NEGATIVE
Glucose, UA: NEGATIVE mg/dL
HGB URINE DIPSTICK: NEGATIVE
Ketones, ur: NEGATIVE mg/dL
LEUKOCYTES UA: NEGATIVE
NITRITE: NEGATIVE
PROTEIN: NEGATIVE mg/dL
Specific Gravity, Urine: 1.02 (ref 1.005–1.030)
pH: 5.5 (ref 5.0–8.0)

## 2015-12-21 LAB — APTT: APTT: 26 s (ref 24–37)

## 2015-12-21 MED ORDER — METHYLPREDNISOLONE 4 MG PO TBPK: ORAL_TABLET | ORAL | Status: DC

## 2015-12-21 NOTE — Discharge Instructions (Signed)
Check sugars regularly as they will go up with steroid dosing.  If you were given medicines take as directed.  If you are on coumadin or contraceptives realize their levels and effectiveness is altered by many different medicines.  If you have any reaction (rash, tongues swelling, other) to the medicines stop taking and see a physician.    If your blood pressure was elevated in the ER make sure you follow up for management with a primary doctor or return for chest pain, shortness of breath or stroke symptoms.  Please follow up as directed and return to the ER or see a physician for new or worsening symptoms such as leg weakness, incontinence.  Thank you. Filed Vitals:   12/21/15 1545 12/21/15 1600 12/21/15 1630 12/21/15 1923  BP: 122/54 132/62 126/102 130/50  Pulse: 61 64 72 79  Temp:      TempSrc:      Resp: 14 14 18 15   SpO2: 91% 95% 92% 92%

## 2015-12-21 NOTE — Progress Notes (Signed)
VASCULAR LAB PRELIMINARY  PRELIMINARY  PRELIMINARY  PRELIMINARY  Bilateral lower extremity venous duplex has been completed.     Bilateral:  No evidence of DVT, superficial thrombosis, or Baker's Cyst.  Bilaterally: peroneal vein difficult due to edema and depth of vessel.  Janifer Adie, RVT, RDMS 12/21/2015, 10:57 AM

## 2015-12-21 NOTE — ED Notes (Signed)
Spoke to Carlos Herman from MRI who reports pt is next for MRI. Updated family.

## 2015-12-21 NOTE — ED Notes (Signed)
Family updated on wait time for MRI approximately 1.5hrs. Verbalized understanding

## 2015-12-21 NOTE — ED Notes (Signed)
MD at bedside. 

## 2015-12-21 NOTE — ED Notes (Signed)
Patient left at this time with all belongings. 

## 2015-12-21 NOTE — ED Notes (Signed)
Pt is aware urine is needed, pt unable to urinate at this time. Urinal at bedside.

## 2015-12-21 NOTE — ED Notes (Signed)
Neurology at bedside.

## 2015-12-21 NOTE — ED Provider Notes (Signed)
CSN: [id number]     Arrival date & time 12/21/15  [id number] History   First MD Initiated Contact with Patient 12/21/15 [phone]     Chief Complaint  Patient presents with  . Gait Problem     (Consider location/radiation/quality/duration/timing/severity/associated sxs/prior Treatment) HPI Patient is a poor historian. Previous history of stroke. Presents with difficulty ambulating. The last 2-3 days. States he's having intermittent numbness and pain in both legs. Was initially concerned that he may have gout. Denies any focal weakness. No visual or speech changes. No recent fall. States that the pain is like is worse with ambulation. Past Medical History  Diagnosis Date  . Depression   . Osteoarthritis   . Stroke ([place]) \  . Dyslipidemia   . DM (diabetes mellitus) ([place])   . Nephrolithiasis   . Obesity     morbid  . Leg edema     right  . Hypertension   . CAD (coronary artery disease)     a. s/p CABG; b. s/p diffuse stenting to RCA; c. 3/4 grafts known to be occluded;  d.  LHC 12/15:  pLAD 100, small CFx diff disz - no amenable to PCI, RCA severe mid and dist ISR, S-OM, S-RCA 100, L-LAD atretic, S-Dx ok (dist seg of Dx 100) >> PCI:  DES to mRCA and POBA/cutting balloon to dist RCA   . Hemorrhoids     internal  . Hx of echocardiogram     a. Echo 10/15:  mild LVH, EF 55-60%, no RWMA, Ao root 39 mm, mild MR, mild LAE  . Pulmonary embolism ([place])     dx 03/2014 >> On Apixaban  . History of Doppler ultrasound     Carotid US (2/16):  Normal carotid arteries bilaterally.   Past Surgical History  Procedure Laterality Date  . Heart bypass  1996  . Angioplasty / stenting iliac    . Total knee arthroplasty      right  . Left heart catheterization with coronary/graft angiogram N/A 06/16/2014    Procedure: LEFT HEART CATHETERIZATION WITH Beatrix Fetters;  Surgeon: Burnell Blanks, MD;  Location: [facility] CATH LAB;  Service: Cardiovascular;  Laterality: N/A;  . Percutaneous coronary stent  intervention (pci-s)  06/16/2014    Procedure: PERCUTANEOUS CORONARY STENT INTERVENTION (PCI-S);  Surgeon: Burnell Blanks, MD;  Location: [facility] CATH LAB;  Service: Cardiovascular;;  MID RCA, Cutting Balloon and Stent Distal RCA, Cutting Balloon    Family History  Problem Relation Age of Onset  . Heart attack Neg Hx   . Stroke Mother   . Diabetes Mother    Social History  Substance Use Topics  . Smoking status: Never Smoker   . Smokeless tobacco: Never Used  . Alcohol Use: Yes     Comment: Occasional    Review of Systems  Constitutional: Negative for fever and chills.  Respiratory: Negative for shortness of breath.   Cardiovascular: Positive for leg swelling. Negative for chest pain and palpitations.  Gastrointestinal: Negative for nausea, vomiting, abdominal pain and diarrhea.  Genitourinary: Negative for dysuria, frequency and flank pain.  Musculoskeletal: Positive for myalgias, arthralgias and gait problem. Negative for neck pain and neck stiffness.  Skin: Negative for rash and wound.  Neurological: Positive for numbness. Negative for dizziness, speech difficulty, weakness, light-headedness and headaches.  All other systems reviewed and are negative.     Allergies  Penicillins and Statins  Home Medications   Prior to Admission medications   Medication Sig Start Date End Date Taking? Authorizing  Provider  amLODipine (NORVASC) 10 MG tablet Take 1 tablet (10 mg total) by mouth at bedtime. 02/02/15  Yes Josue Hector, MD  aspirin 81 MG tablet Take 81 mg by mouth daily.   Yes Historical Provider, MD  clopidogrel (PLAVIX) 75 MG tablet Take 1 tablet (75 mg total) by mouth daily with breakfast. 09/21/15  Yes Josue Hector, MD  glimepiride (AMARYL) 2 MG tablet Take 2 mg by mouth daily. 03/02/15  Yes Historical Provider, MD  glimepiride (AMARYL) 2 MG tablet Take 2 mg by mouth daily with breakfast.   Yes Historical Provider, MD  isosorbide mononitrate (IMDUR) 60 MG 24 hr tablet  Take 1 tablet (60 mg total) by mouth at bedtime. 04/13/15  Yes Josue Hector, MD  metFORMIN (GLUCOPHAGE) 500 MG tablet Take 2 tablets (1,000 mg total) by mouth 2 (two) times daily with a meal. 06/18/14  Yes Erlene Quan, PA-C  metoprolol succinate (TOPROL-XL) 100 MG 24 hr tablet Take 50-100 mg by mouth 2 (two) times daily. TAKE 1 TABLET BY MOUTH IN THE AM & 1/2 TABLET BY MOUTH IN THE PM.  100 mg in the morning and 50 mg every evening   Yes Historical Provider, MD  NON FORMULARY Take 1 tablet by mouth daily. Take daily () Vitamin D3 2000 mg, Calcium 333 mg, magnesium 133 mg, zinc 5mg .)   Yes Historical Provider, MD  oxyCODONE-acetaminophen (PERCOCET) 10-325 MG per tablet Take 1 tablet by mouth every 4 (four) hours as needed for pain.  06/09/14  Yes Historical Provider, MD  nitroGLYCERIN (NITROSTAT) 0.4 MG SL tablet Place 1 tablet (0.4 mg total) under the tongue every 5 (five) minutes as needed for chest pain. 07/06/14   Josue Hector, MD   BP 136/69 mmHg  Pulse 65  Temp(Src) 97.5 F (36.4 C) (Oral)  Resp 19  SpO2 94% Physical Exam  Constitutional: He is oriented to person, place, and time. He appears well-developed and well-nourished. No distress.  HENT:  Head: Normocephalic and atraumatic.  Mouth/Throat: Oropharynx is clear and moist. No oropharyngeal exudate.  Eyes: EOM are normal. Pupils are equal, round, and reactive to light.  Neck: Normal range of motion. Neck supple.  Cardiovascular: Normal rate and regular rhythm.  Exam reveals no gallop and no friction rub.   No murmur heard. Pulmonary/Chest: Effort normal and breath sounds normal. No respiratory distress. He has no wheezes. He has no rales.  Abdominal: Soft. Bowel sounds are normal. He exhibits no distension and no mass. There is no tenderness. There is no rebound and no guarding.  Musculoskeletal: Normal range of motion. He exhibits edema. He exhibits no tenderness.  Patient has 3+ right lower extremity pitting edema and 2+ left  lower extremity pitting edema. No warmth. Difficult to palpate posterior tibial and dorsalis pedis pulses bilaterally. Strong Doppler signal.  Neurological: He is alert and oriented to person, place, and time.  5/5 motor in all extremities. Sensation appears fully intact. Cranial nerves II through XII intact. Bilateral finger-to-nose is intact.  Skin: Skin is warm and dry. No rash noted. No erythema.  Psychiatric: He has a normal mood and affect. His behavior is normal.  Nursing note and vitals reviewed.   ED Course  Procedures (including critical care time) Labs Review Labs Reviewed  CBC WITH DIFFERENTIAL/PLATELET - Abnormal; Notable for the following:    RBC 4.08 (*)    Hemoglobin 12.4 (*)    HCT 38.4 (*)    All other components within normal limits  COMPREHENSIVE METABOLIC PANEL - Abnormal; Notable for the following:    Glucose, Bld 117 (*)    Total Protein 6.2 (*)    ALT 16 (*)    All other components within normal limits  URINALYSIS, ROUTINE W REFLEX MICROSCOPIC (NOT AT [facility])  PROTIME-INR  APTT    Imaging Review Ct Head Wo Contrast  12/21/2015  CLINICAL DATA:  Marked worsening of bilateral lower extremity weakness this morning. EXAM: CT HEAD WITHOUT CONTRAST TECHNIQUE: Contiguous axial images were obtained from the base of the skull through the vertex without intravenous contrast. COMPARISON:  Head CT scan 05/05/2007.  Brain MRI 06/18/2004. FINDINGS: Chronic microvascular ischemic change is identified. Remote left frontoparietal infarct is also again seen. No evidence of acute intracranial abnormality including hemorrhage, infarct, mass lesion, mass effect, midline shift or abnormal extra-axial fluid collection. Atherosclerosis is noted. No hydrocephalus or pneumocephalus. The calvarium is intact. IMPRESSION: No acute abnormality. Remote left frontoparietal infarct and chronic microvascular ischemic change. Atherosclerosis. Electronically Signed   By: Inge Rise M.D.   On:  12/21/2015 11:43   I have personally reviewed and evaluated these images and lab results as part of my medical decision-making.   EKG Interpretation   Date/Time:  Friday December 21 2015 09:02:54 EDT Ventricular Rate:  65 PR Interval:    QRS Duration: 98 QT Interval:  424 QTC Calculation: 441 R Axis:   41 Text Interpretation:  Sinus rhythm Atrial premature complexes Confirmed by  Lita Mains  MD, Rushil Kimbrell ([id number]) on 12/21/2015 2:52:02 PM      MDM   Final diagnoses:  Lower extremity weakness    Bilateral lower extremity swelling. No evidence of gout. Doppler ultrasound of bilateral extremities without DVT present. Head CT without acute changes. Patient presents with very vague symptoms. States similar to his previous stroke. Discussed with neurology to review the patient. Recommend MRI thoracic and lumbar spine. If no findings can be discharged to follow-up as an outpatient. Signed out to oncoming emergency physician pending MRI.    Julianne Rice, MD 12/22/15 [phone]

## 2015-12-21 NOTE — Consult Note (Signed)
Requesting Physician: Dr. Lita Mains    Chief Complaint: bilateral intermittent leg weakness and pain R>L  History obtained from:  Patient  HPI:                                                                                                                                         Carlos Herman is an 75 y.o. male who has had multiple issues with back pain and spinal stenosis which he has received facet injections in the past. He has noted over the past month he has had on and off leg discomfort and weakness. Mid week he awoke and noted he was having decreased sensation of his right leg which resolved. Today he was sitting on the edge of a seat and noted he was having a hard time moving his right leg and foot. He does not walk long distances due to gradual cramping in his legs and has noted he often times has to lean forward on his shopping cart due to pain in his legs. He has had knee surgery on the right leg with incomplete return of ROM. He has neuropathy in bilateral legs up to his knee.   He is worried it may be gout but he has no erythema or joint tenderness in his foot or ankle on either side.   Date last known well: Unable to determine Time last known well: Unable to determine tPA Given: No: out of window   Past Medical History  Diagnosis Date  . Depression   . Osteoarthritis   . Stroke ([place]) \  . Dyslipidemia   . DM (diabetes mellitus) ([place])   . Nephrolithiasis   . Obesity     morbid  . Leg edema     right  . Hypertension   . CAD (coronary artery disease)     a. s/p CABG; b. s/p diffuse stenting to RCA; c. 3/4 grafts known to be occluded;  d.  LHC 12/15:  pLAD 100, small CFx diff disz - no amenable to PCI, RCA severe mid and dist ISR, S-OM, S-RCA 100, L-LAD atretic, S-Dx ok (dist seg of Dx 100) >> PCI:  DES to mRCA and POBA/cutting balloon to dist RCA   . Hemorrhoids     internal  . Hx of echocardiogram     a. Echo 10/15:  mild LVH, EF 55-60%, no RWMA, Ao root 39 mm, mild  MR, mild LAE  . Pulmonary embolism ([place])     dx 03/2014 >> On Apixaban  . History of Doppler ultrasound     Carotid US (2/16):  Normal carotid arteries bilaterally.    Past Surgical History  Procedure Laterality Date  . Heart bypass  1996  . Angioplasty / stenting iliac    . Total knee arthroplasty      right  . Left heart catheterization with coronary/graft angiogram N/A 06/16/2014    Procedure: LEFT HEART  CATHETERIZATION WITH Beatrix Fetters;  Surgeon: Burnell Blanks, MD;  Location: [facility] CATH LAB;  Service: Cardiovascular;  Laterality: N/A;  . Percutaneous coronary stent intervention (pci-s)  06/16/2014    Procedure: PERCUTANEOUS CORONARY STENT INTERVENTION (PCI-S);  Surgeon: Burnell Blanks, MD;  Location: [facility] CATH LAB;  Service: Cardiovascular;;  MID RCA, Cutting Balloon and Stent Distal RCA, Cutting Balloon     Family History  Problem Relation Age of Onset  . Heart attack Neg Hx   . Stroke Mother   . Diabetes Mother    Social History:  reports that he has never smoked. He has never used smokeless tobacco. He reports that he drinks alcohol. He reports that he does not use illicit drugs.  Allergies:  Allergies  Allergen Reactions  . Penicillins Swelling and Other (See Comments)    redness  . Statins Other (See Comments)      muscle pain    Medications:                                                                                                                           No current facility-administered medications for this encounter.   Current Outpatient Prescriptions  Medication Sig Dispense Refill  . amLODipine (NORVASC) 10 MG tablet Take 1 tablet (10 mg total) by mouth at bedtime. 90 tablet 3  . aspirin 81 MG tablet Take 81 mg by mouth daily.    . clopidogrel (PLAVIX) 75 MG tablet Take 1 tablet (75 mg total) by mouth daily with breakfast. 90 tablet 3  . glimepiride (AMARYL) 2 MG tablet Take 2 mg by mouth daily.  0  . glimepiride (AMARYL) 2  MG tablet Take 2 mg by mouth daily with breakfast.    . isosorbide mononitrate (IMDUR) 60 MG 24 hr tablet Take 1 tablet (60 mg total) by mouth at bedtime. 90 tablet 3  . metFORMIN (GLUCOPHAGE) 500 MG tablet Take 2 tablets (1,000 mg total) by mouth 2 (two) times daily with a meal.    . metoprolol succinate (TOPROL-XL) 100 MG 24 hr tablet Take 50-100 mg by mouth 2 (two) times daily. TAKE 1 TABLET BY MOUTH IN THE AM & 1/2 TABLET BY MOUTH IN THE PM.  100 mg in the morning and 50 mg every evening    . NON FORMULARY Take 1 tablet by mouth daily. Take daily () Vitamin D3 2000 mg, Calcium 333 mg, magnesium 133 mg, zinc 5mg .)    . oxyCODONE-acetaminophen (PERCOCET) 10-325 MG per tablet Take 1 tablet by mouth every 4 (four) hours as needed for pain.   0  . nitroGLYCERIN (NITROSTAT) 0.4 MG SL tablet Place 1 tablet (0.4 mg total) under the tongue every 5 (five) minutes as needed for chest pain. 25 tablet 4     ROS:  History obtained from the patient  General ROS: negative for - chills, fatigue, fever, night sweats, weight gain or weight loss Psychological ROS: negative for - behavioral disorder, hallucinations, memory difficulties, mood swings or suicidal ideation Ophthalmic ROS: negative for - blurry vision, double vision, eye pain or loss of vision ENT ROS: negative for - epistaxis, nasal discharge, oral lesions, sore throat, tinnitus or vertigo Allergy and Immunology ROS: negative for - hives or itchy/watery eyes Hematological and Lymphatic ROS: negative for - bleeding problems, bruising or swollen lymph nodes Endocrine ROS: negative for - galactorrhea, hair pattern changes, polydipsia/polyuria or temperature intolerance Respiratory ROS: negative for - cough, hemoptysis, shortness of breath or wheezing Cardiovascular ROS: negative for - chest pain, dyspnea on exertion,  edema or irregular heartbeat Gastrointestinal ROS: negative for - abdominal pain, diarrhea, hematemesis, nausea/vomiting or stool incontinence Genito-Urinary ROS: negative for - dysuria, hematuria, incontinence or urinary frequency/urgency Musculoskeletal ROS: negative for - joint swelling or muscular weakness Neurological ROS: as noted in HPI Dermatological ROS: negative for rash and skin lesion changes  Neurologic Examination:                                                                                                      Blood pressure 129/60, pulse 61, temperature 97.5 F (36.4 C), temperature source Oral, resp. rate 16, SpO2 95 %.  HEENT-  Normocephalic, no lesions, without obvious abnormality.  Normal external eye and conjunctiva.  Normal TM's bilaterally.  Normal auditory canals and external ears. Normal external nose, mucus membranes and septum.  Normal pharynx. Cardiovascular- S1, S2 normal, pulses palpable throughout   Lungs- chest clear, no wheezing, rales, normal symmetric air entry Abdomen- normal findings: bowel sounds normal Extremities- no edema Lymph-no adenopathy palpable Musculoskeletal-no joint tenderness, deformity or swelling Skin-warm and dry, no hyperpigmentation, vitiligo, or suspicious lesions  Neurological Examination Mental Status: Alert, oriented, thought content appropriate.  Speech fluent without evidence of aphasia.  Able to follow 3 step commands without difficulty. Cranial Nerves: II: Discs flat bilaterally; Visual fields grossly normal, pupils equal, round, reactive to light and accommodation III,IV, VI: ptosis not present, extra-ocular motions intact bilaterally V,VII: smile symmetric, facial light touch sensation normal bilaterally VIII: hearing normal bilaterally IX,X: uvula rises symmetrically XI: bilateral shoulder shrug XII: midline tongue extension Motor: Right : Upper extremity   5/5    Left:     Upper extremity   5/5  Lower extremity    5/5     Lower extremity   5/5 Tone and bulk:normal tone throughout; no atrophy noted Sensory: Pinprick and light touch/temperature decreased bilaterally from knees to feet, no vibratory sensation in ankle or feet. Intact proprioception Deep Tendon Reflexes: 1+ and symmetric throughout UE no AJ or KJ Plantars: Right: downgoing   Left: downgoing Cerebellar: normal finger-to-nose normal heel-to-shin test Gait: not tested      Lab Results: Basic Metabolic Panel:  Recent Labs Lab 12/21/15 1001  NA 137  K 4.2  CL 106  CO2 24  GLUCOSE 117*  BUN 13  CREATININE 1.05  CALCIUM 9.1    Liver  Function Tests:  Recent Labs Lab 12/21/15 1001  AST 18  ALT 16*  ALKPHOS 67  BILITOT 0.5  PROT 6.2*  ALBUMIN 3.5   No results for input(s): LIPASE, AMYLASE in the last 168 hours. No results for input(s): AMMONIA in the last 168 hours.  CBC:  Recent Labs Lab 12/21/15 1001  WBC 8.3  NEUTROABS 5.8  HGB 12.4*  HCT 38.4*  MCV 94.1  PLT 224    Cardiac Enzymes: No results for input(s): CKTOTAL, CKMB, CKMBINDEX, TROPONINI in the last 168 hours.  Lipid Panel: No results for input(s): CHOL, TRIG, HDL, CHOLHDL, VLDL, LDLCALC in the last 168 hours.  CBG: No results for input(s): GLUCAP in the last 168 hours.  Microbiology: Results for orders placed or performed during the hospital encounter of 06/16/14  MRSA PCR Screening     Status: None   Collection Time: 06/16/14  3:28 PM  Result Value Ref Range Status   MRSA by PCR NEGATIVE NEGATIVE Final    Comment:        The GeneXpert MRSA Assay (FDA approved for NASAL specimens only), is one component of a comprehensive MRSA colonization surveillance program. It is not intended to diagnose MRSA infection nor to guide or monitor treatment for MRSA infections.     Coagulation Studies:  Recent Labs  12/21/15 1001  LABPROT 14.1  INR 1.07    Imaging: Ct Head Wo Contrast  12/21/2015  CLINICAL DATA:  Marked worsening of  bilateral lower extremity weakness this morning. EXAM: CT HEAD WITHOUT CONTRAST TECHNIQUE: Contiguous axial images were obtained from the base of the skull through the vertex without intravenous contrast. COMPARISON:  Head CT scan 05/05/2007.  Brain MRI 06/18/2004. FINDINGS: Chronic microvascular ischemic change is identified. Remote left frontoparietal infarct is also again seen. No evidence of acute intracranial abnormality including hemorrhage, infarct, mass lesion, mass effect, midline shift or abnormal extra-axial fluid collection. Atherosclerosis is noted. No hydrocephalus or pneumocephalus. The calvarium is intact. IMPRESSION: No acute abnormality. Remote left frontoparietal infarct and chronic microvascular ischemic change. Atherosclerosis. Electronically Signed   By: Inge Rise M.D.   On: 12/21/2015 11:43       Assessment and plan discussed with with attending physician and they are in agreement.    Etta Quill PA-C [facility] [phone]  12/21/2015, 12:39 PM   Assessment: 74 y.o. male with gradually progressive difficulty with walking likely multifactorial related to previous stroke, neuropathy, lumbar disease. His description of new pain and progressive difficulty over the past week does I think make imaging prudent.  Stroke Risk Factors - diabetes mellitus, hyperlipidemia and hypertension  1) MRI thoracic and lumbar spine 2) would consider outpatient neurology referral for EMG  Roland Rack, MD [facility] [phone]  If 7pm- 7am, please page neurology on call as listed in [place].

## 2015-12-21 NOTE — ED Provider Notes (Signed)
Patient's care signed out to myself to follow-up MRI results and reassess. Patient's had worsening back pain and ambulation difficulties patient had to walk on forward today. Patient has had lumbar surgery by local neurosurgeon. MRI results show worsening spinal stenosis. Discussed with neurosurgeon Dr. Trenton Gammon on-call who will ensure close follow-up in the clinic with Dr. Ronnald Ramp. Ct Head Wo Contrast  12/21/2015  CLINICAL DATA:  Marked worsening of bilateral lower extremity weakness this morning. EXAM: CT HEAD WITHOUT CONTRAST TECHNIQUE: Contiguous axial images were obtained from the base of the skull through the vertex without intravenous contrast. COMPARISON:  Head CT scan 05/05/2007.  Brain MRI 06/18/2004. FINDINGS: Chronic microvascular ischemic change is identified. Remote left frontoparietal infarct is also again seen. No evidence of acute intracranial abnormality including hemorrhage, infarct, mass lesion, mass effect, midline shift or abnormal extra-axial fluid collection. Atherosclerosis is noted. No hydrocephalus or pneumocephalus. The calvarium is intact. IMPRESSION: No acute abnormality. Remote left frontoparietal infarct and chronic microvascular ischemic change. Atherosclerosis. Electronically Signed   By: Inge Rise M.D.   On: 12/21/2015 11:43   Mr Thoracic Spine Wo Contrast  12/21/2015  CLINICAL DATA:  Lower extremity weakness. Difficulty moving the right leg and foot today. Intermittent leg discomfort and weakness for the past month. Recent decreased sensation in the right leg which resolved. EXAM: MRI THORACIC AND LUMBAR SPINE WITHOUT CONTRAST TECHNIQUE: Multiplanar and multiecho pulse sequences of the thoracic and lumbar spine were obtained without intravenous contrast. COMPARISON:  Lumbar spine MRI 03/19/2011. Lumbar spine myelogram 04/28/2012. Chest CTA 04/10/2014. CT abdomen and pelvis 04/27/2014. FINDINGS: MRI THORACIC SPINE FINDINGS Alignment: Mild mid thoracic dextroscoliosis again  seen. No significant listhesis. Vertebrae: Preserved vertebral body heights without evidence of fracture. No suspicious osseous lesion or significant marrow edema identified. Mild multilevel type 2 degenerative endplate changes. Cord:  Normal cord signal. Paraspinal and other soft tissues: Unremarkable. Disc levels: At T8-9, a small central disc protrusion and facet and ligamentum flavum hypertrophy result in mild spinal stenosis with mild cord flattening. There is a small right central disc extrusion at T6-7 with mild cord flattening but no significant spinal stenosis. Shallow left paracentral disc protrusions at T4-5 and T7-8 without spinal stenosis or significant spinal cord mass effect. Small central disc protrusion at C6-7 with slight impression on the spinal cord but no significant stenosis. There is advanced facet arthrosis at multiple levels in the thoracic spine. Facet spurring results in moderate to severe right neural foraminal stenosis at T3-4, T4-5, and T5-6 and mild-to-moderate bilateral foraminal stenosis at T8-9. There is mild right neural foraminal stenosis at T9-10 and T11-12. MRI LUMBAR SPINE FINDINGS Segmentation:  Standard. Alignment: Slight left convex curvature of the lumbar spine. Unchanged anterolisthesis of L4 on L5 measuring 9 mm. Vertebrae: Preserve vertebral body heights without evidence of fracture. Mild multilevel type 2 degenerative endplate changes. No suspicious focal osseous lesion or evidence of significant vertebral marrow edema. Conus medullaris: Extends to the T12-L1 level and appears normal. Paraspinal and other soft tissues: Postoperative changes and paraspinal muscle fatty atrophy. Disc levels: L1-2: Disc bulging and moderate facet and ligamentum flavum hypertrophy result in increased, severe spinal stenosis and moderate to severe bilateral neural foraminal stenosis. L2-3: Prior posterior decompression. There may be a small synovial cyst extending from the medial aspect of  the right facet joint dorsally in the epidural space, measuring up to 3 mm in thickness and approximately 13 mm in length, not present on the prior MRI. No significant spinal stenosis. Disc bulging and advanced  facet arthrosis result in severe bilateral neural foraminal stenosis which has mildly progressed. L3-4: Prior right laminectomy. Disc bulging mildly asymmetric to the left and advanced facet arthrosis result in moderate right and moderate to severe left neural foraminal stenosis, unchanged. No spinal stenosis. L4-5: Prior right laminectomy. Interspinous fixation device remains in place. Listhesis with disc uncovering and advanced facet arthrosis with mild left greater than right lateral recess stenosis, similar to prior. No significant spinal or neural foraminal stenosis. L5-S1: Advanced facet arthrosis results in mild to moderate left neural foraminal stenosis medially, unchanged. No spinal stenosis. IMPRESSION: MR THORACIC SPINE IMPRESSION 1. Small disc herniations with mild cord flattening at T6-7 and T8-9. Mild spinal stenosis at T8-9. 2. Advanced multilevel thoracic facet arthrosis with severe neural foraminal stenosis as above. MR LUMBAR SPINE IMPRESSION 1. Progressive, severe multifactorial spinal stenosis at L1-2 with moderate to severe bilateral foraminal stenosis. 2. Severe bilateral foraminal stenosis at L2-3, mildly progressed. 3. Moderate right and moderate to severe left foraminal stenosis at L3-4, unchanged. Electronically Signed   By: Logan Bores M.D.   On: 12/21/2015 20:47   Mr Lumbar Spine Wo Contrast  12/21/2015  CLINICAL DATA:  Lower extremity weakness. Difficulty moving the right leg and foot today. Intermittent leg discomfort and weakness for the past month. Recent decreased sensation in the right leg which resolved. EXAM: MRI THORACIC AND LUMBAR SPINE WITHOUT CONTRAST TECHNIQUE: Multiplanar and multiecho pulse sequences of the thoracic and lumbar spine were obtained without  intravenous contrast. COMPARISON:  Lumbar spine MRI 03/19/2011. Lumbar spine myelogram 04/28/2012. Chest CTA 04/10/2014. CT abdomen and pelvis 04/27/2014. FINDINGS: MRI THORACIC SPINE FINDINGS Alignment: Mild mid thoracic dextroscoliosis again seen. No significant listhesis. Vertebrae: Preserved vertebral body heights without evidence of fracture. No suspicious osseous lesion or significant marrow edema identified. Mild multilevel type 2 degenerative endplate changes. Cord:  Normal cord signal. Paraspinal and other soft tissues: Unremarkable. Disc levels: At T8-9, a small central disc protrusion and facet and ligamentum flavum hypertrophy result in mild spinal stenosis with mild cord flattening. There is a small right central disc extrusion at T6-7 with mild cord flattening but no significant spinal stenosis. Shallow left paracentral disc protrusions at T4-5 and T7-8 without spinal stenosis or significant spinal cord mass effect. Small central disc protrusion at C6-7 with slight impression on the spinal cord but no significant stenosis. There is advanced facet arthrosis at multiple levels in the thoracic spine. Facet spurring results in moderate to severe right neural foraminal stenosis at T3-4, T4-5, and T5-6 and mild-to-moderate bilateral foraminal stenosis at T8-9. There is mild right neural foraminal stenosis at T9-10 and T11-12. MRI LUMBAR SPINE FINDINGS Segmentation:  Standard. Alignment: Slight left convex curvature of the lumbar spine. Unchanged anterolisthesis of L4 on L5 measuring 9 mm. Vertebrae: Preserve vertebral body heights without evidence of fracture. Mild multilevel type 2 degenerative endplate changes. No suspicious focal osseous lesion or evidence of significant vertebral marrow edema. Conus medullaris: Extends to the T12-L1 level and appears normal. Paraspinal and other soft tissues: Postoperative changes and paraspinal muscle fatty atrophy. Disc levels: L1-2: Disc bulging and moderate facet and  ligamentum flavum hypertrophy result in increased, severe spinal stenosis and moderate to severe bilateral neural foraminal stenosis. L2-3: Prior posterior decompression. There may be a small synovial cyst extending from the medial aspect of the right facet joint dorsally in the epidural space, measuring up to 3 mm in thickness and approximately 13 mm in length, not present on the prior MRI. No significant spinal  stenosis. Disc bulging and advanced facet arthrosis result in severe bilateral neural foraminal stenosis which has mildly progressed. L3-4: Prior right laminectomy. Disc bulging mildly asymmetric to the left and advanced facet arthrosis result in moderate right and moderate to severe left neural foraminal stenosis, unchanged. No spinal stenosis. L4-5: Prior right laminectomy. Interspinous fixation device remains in place. Listhesis with disc uncovering and advanced facet arthrosis with mild left greater than right lateral recess stenosis, similar to prior. No significant spinal or neural foraminal stenosis. L5-S1: Advanced facet arthrosis results in mild to moderate left neural foraminal stenosis medially, unchanged. No spinal stenosis. IMPRESSION: MR THORACIC SPINE IMPRESSION 1. Small disc herniations with mild cord flattening at T6-7 and T8-9. Mild spinal stenosis at T8-9. 2. Advanced multilevel thoracic facet arthrosis with severe neural foraminal stenosis as above. MR LUMBAR SPINE IMPRESSION 1. Progressive, severe multifactorial spinal stenosis at L1-2 with moderate to severe bilateral foraminal stenosis. 2. Severe bilateral foraminal stenosis at L2-3, mildly progressed. 3. Moderate right and moderate to severe left foraminal stenosis at L3-4, unchanged. Electronically Signed   By: Logan Bores M.D.   On: 12/21/2015 20:47    Lumbar spinal stenosis  Lower extremity weakness - Plan: MR Thoracic Spine Wo Contrast, MR Thoracic Spine Wo Contrast    Elnora Morrison, MD 12/21/15 2111

## 2015-12-21 NOTE — ED Notes (Signed)
Pt reports abnormal gait x 2 days. Pt is concerned that this is a stroke. Pt also reports that he is worried that he may have gout in his great toes on both feet.

## 2016-01-07 DIAGNOSIS — M4806 Spinal stenosis, lumbar region: Secondary | ICD-10-CM | POA: Diagnosis not present

## 2016-01-10 ENCOUNTER — Telehealth: Payer: Self-pay

## 2016-01-10 NOTE — Telephone Encounter (Addendum)
The pt is to be scheduled for a L/1-2 Laminectomy under general anesthesia with Dr Ronnald Ramp with [facility]. They are requesting:   1. Cardiac clearance/risk assessment. 2. Medication instructions-The pt is taking Asa 81 mg daily and Plavix 75 mg daily. Please advise if theses meds wiil need to be held prior to surgery and for how long.  Please advise.  Fax message to [place] at [facility] [place]. Phone# [phone] ext: [phone]

## 2016-01-10 NOTE — Telephone Encounter (Deleted)
The pt is to be scheduled for a L/1-2 Laminectomy under general anesthesia with Dr Ronnald Ramp with [facility]. They are requesting:   1. Cardiac clearance/risk assessment. 2. Medication instructions-The pt is taking Asa 81 mg daily and Plavix 75 mg daily. Please advise if theses meds wiil need to be held prior to surgery and for how long.  Please advise.

## 2016-01-10 NOTE — Telephone Encounter (Signed)
Error

## 2016-01-13 NOTE — Telephone Encounter (Signed)
[place] for surgery hold plavix and asa for 5 days before surgery

## 2016-01-15 NOTE — Telephone Encounter (Signed)
Sent Fax to [place].

## 2016-01-18 ENCOUNTER — Other Ambulatory Visit: Payer: Self-pay | Admitting: Neurological Surgery

## 2016-01-28 ENCOUNTER — Encounter (HOSPITAL_COMMUNITY): Payer: Self-pay

## 2016-01-28 NOTE — Pre-Procedure Instructions (Addendum)
Carlos Herman  01/28/2016      [id number] [place], [place] [id number] [place] [id number] Phone: [phone] Fax: [phone]  [facility] [place] Herman, [place] [address] [place] [place] [address] Phone: [phone] Fax: [phone]    Your procedure is scheduled on Wednesday, August 9.  Report to [name] Admitting at 12: 40PM                Your surgery or procedure is scheduled for 2:40 PM   Call this number if you have problems the morning of [phone]               For any other questions, please call [phone], Monday - Friday 8 AM - 4 PM.   Remember:  Do not eat food or drink liquids after midnight Tuesday, August 8   Take these medicines the morning of surgery with A SIP OF WATER : amLODipine (NORVASC), Metoprolol succinate (TOPROL-XL).                 Take if needed: Nitroglycerin. May take if needed and you can tolerate on an empty stomach:  oxyCODONE-acetaminophen (PERCOCET).         Per MD's instructions, stop Plavix and Aspirin 5 days prior to surgery.   5 days prior to surgery, stop taking: NSAIDS, Aleve, Naproxen, Ibuprofen, Advil, Motrin, BC's, Goody's, Fish oil, all herbal medications, and all vitamins.                  DO NOT take medications for Diabetes the morning of surgery.  Do NOT take Glimepiride (Amaryl) and Metformin (Glucophage) the morning of surgery.    Do not wear jewelry.  Do not wear lotions, powders, or colognes.    Men may shave face and neck.  Do not bring valuables to the hospital.   [name] is not responsible for any belongings or valuables.  Contacts, dentures or bridgework may not be worn into surgery.  Leave your suitcase in the car.  After surgery it may be brought to your room.  For patients admitted to the hospital, discharge time will be determined by your treatment team.  Special  instructions: Preparing for Surgery.   Please read over the following fact sheets that you were given.  Patient Instructions for Mupirocin Application    [name]- Preparing For Surgery  Before surgery, you can play an important role. Because skin is not sterile, your skin needs to be as free of germs as possible. You can reduce the number of germs on your skin by washing with CHG (chlorahexidine gluconate) Soap before surgery.  CHG is an antiseptic cleaner which kills germs and bonds with the skin to continue killing germs even after washing.  Please do not use if you have an allergy to CHG or antibacterial soaps. If your skin becomes reddened/irritated stop using the CHG.  Do not shave (including legs and underarms) for at least 48 hours prior to first CHG shower. It is OK to shave your face.  Please follow these instructions carefully.   1. Shower the NIGHT BEFORE SURGERY and the MORNING OF SURGERY with CHG.   2. If you chose to wash your hair, wash your hair first as usual with your normal shampoo.  3. After you shampoo, rinse your hair and body thoroughly to remove the shampoo.  4. Use CHG as you would any other  liquid soap. You can apply CHG directly to the skin and wash gently with a scrungie or a clean washcloth.   5. Apply the CHG Soap to your body ONLY FROM THE NECK DOWN.  Do not use on open wounds or open sores. Avoid contact with your eyes, ears, mouth and genitals (private parts). Wash genitals (private parts) with your normal soap.  6. Wash thoroughly, paying special attention to the area where your surgery will be performed.  7. Thoroughly rinse your body with warm water from the neck down.  8. DO NOT shower/wash with your normal soap after using and rinsing off the CHG Soap.  9. Pat yourself dry with a CLEAN TOWEL.   10. Wear CLEAN PAJAMAS   11. Place CLEAN SHEETS on your bed the night of your first shower and DO NOT SLEEP WITH PETS.  Day of Surgery: Do not  apply any deodorants/lotions. Please wear clean clothes to the hospital/surgery center.

## 2016-01-28 NOTE — Progress Notes (Signed)
   How to Manage Your Diabetes Before and After Surgery  Why is it important to control my blood sugar before and after surgery? . Improving blood sugar levels before and after surgery helps healing and can limit problems. . A way of improving blood sugar control is eating a healthy diet by: o  Eating less sugar and carbohydrates o  Increasing activity/exercise o  Talking with your doctor about reaching your blood sugar goals . High blood sugars (greater than 180 mg/dL) can raise your risk of infections and slow your recovery, so you will need to focus on controlling your diabetes during the weeks before surgery. . Make sure that the doctor who takes care of your diabetes knows about your planned surgery including the date and location.  How do I manage my blood sugar before surgery? . Check your blood sugar at least 4 times a day, starting 2 days before surgery, to make sure that the level is not too high or low. o Check your blood sugar the morning of your surgery when you wake up and every 2 hours until you get to the Short Stay unit. . If your blood sugar is less than 70 mg/dL, you will need to treat for low blood sugar: o Do not take insulin. o Treat a low blood sugar (less than 70 mg/dL) with  cup of clear juice (cranberry or apple), 4 glucose tablets, OR glucose gel. o Recheck blood sugar in 15 minutes after treatment (to make sure it is greater than 70 mg/dL). If your blood sugar is not greater than 70 mg/dL on recheck, call [phone] for further instructions. . Report your blood sugar to the short stay nurse when you get to Short Stay.  . If you are admitted to the hospital after surgery: o Your blood sugar will be checked by the staff and you will probably be given insulin after surgery (instead of oral diabetes medicines) to make sure you have good blood sugar levels. o The goal for blood sugar control after surgery is 80-180 mg/dL.  WHAT DO I DO ABOUT MY DIABETES  MEDICATION?   Marland Kitchen Do not take oral diabetes medicines (pills) the morning of surgery.  . The day of surgery, do not take other diabetes injectables, including Byetta (exenatide), Bydureon (exenatide ER), Victoza (liraglutide), or Trulicity (dulaglutide).  If  Reviewed and Endorsed by [facility] Patient Education Committee, August 2015

## 2016-01-29 ENCOUNTER — Encounter (HOSPITAL_COMMUNITY): Payer: Self-pay

## 2016-01-29 ENCOUNTER — Encounter (HOSPITAL_COMMUNITY)
Admission: RE | Admit: 2016-01-29 | Discharge: 2016-01-29 | Disposition: A | Discharge status: Home / Self Care | Payer: Medicare Other | Source: Ambulatory Visit | Attending: Neurological Surgery | Admitting: Neurological Surgery

## 2016-01-29 ENCOUNTER — Ambulatory Visit (HOSPITAL_COMMUNITY)
Admission: RE | Admit: 2016-01-29 | Discharge: 2016-01-29 | Disposition: A | Discharge status: Home / Self Care | Payer: Medicare Other | Source: Ambulatory Visit | Attending: Neurological Surgery | Admitting: Neurological Surgery

## 2016-01-29 DIAGNOSIS — I10 Essential (primary) hypertension: Secondary | ICD-10-CM | POA: Insufficient documentation

## 2016-01-29 DIAGNOSIS — Z7901 Long term (current) use of anticoagulants: Secondary | ICD-10-CM | POA: Diagnosis not present

## 2016-01-29 DIAGNOSIS — M4806 Spinal stenosis, lumbar region: Secondary | ICD-10-CM | POA: Diagnosis present

## 2016-01-29 DIAGNOSIS — Z7984 Long term (current) use of oral hypoglycemic drugs: Secondary | ICD-10-CM | POA: Diagnosis not present

## 2016-01-29 DIAGNOSIS — M48061 Spinal stenosis, lumbar region without neurogenic claudication: Secondary | ICD-10-CM

## 2016-01-29 DIAGNOSIS — Z7982 Long term (current) use of aspirin: Secondary | ICD-10-CM | POA: Insufficient documentation

## 2016-01-29 DIAGNOSIS — I2510 Atherosclerotic heart disease of native coronary artery without angina pectoris: Secondary | ICD-10-CM | POA: Insufficient documentation

## 2016-01-29 DIAGNOSIS — E119 Type 2 diabetes mellitus without complications: Secondary | ICD-10-CM | POA: Diagnosis not present

## 2016-01-29 DIAGNOSIS — Z79899 Other long term (current) drug therapy: Secondary | ICD-10-CM | POA: Insufficient documentation

## 2016-01-29 DIAGNOSIS — E785 Hyperlipidemia, unspecified: Secondary | ICD-10-CM | POA: Insufficient documentation

## 2016-01-29 DIAGNOSIS — F329 Major depressive disorder, single episode, unspecified: Secondary | ICD-10-CM | POA: Diagnosis not present

## 2016-01-29 DIAGNOSIS — Z01818 Encounter for other preprocedural examination: Secondary | ICD-10-CM | POA: Diagnosis not present

## 2016-01-29 HISTORY — DX: Personal history of urinary calculi: Z87.442

## 2016-01-29 HISTORY — DX: Presence of spectacles and contact lenses: Z97.3

## 2016-01-29 HISTORY — DX: Nocturia: R35.1

## 2016-01-29 HISTORY — DX: Personal history of other diseases of the digestive system: Z87.19

## 2016-01-29 HISTORY — DX: Acute myocardial infarction, unspecified: I21.9

## 2016-01-29 LAB — CBC WITH DIFFERENTIAL/PLATELET
BASOS PCT: 0 %
Basophils Absolute: 0 10*3/uL (ref 0.0–0.1)
Eosinophils Absolute: 0.1 10*3/uL (ref 0.0–0.7)
Eosinophils Relative: 1 %
HEMATOCRIT: 41.9 % (ref 39.0–52.0)
Hemoglobin: 13.7 g/dL (ref 13.0–17.0)
Lymphocytes Relative: 17 %
Lymphs Abs: 1.6 10*3/uL (ref 0.7–4.0)
MCH: 31.6 pg (ref 26.0–34.0)
MCHC: 32.7 g/dL (ref 30.0–36.0)
MCV: 96.5 fL (ref 78.0–100.0)
MONO ABS: 1 10*3/uL (ref 0.1–1.0)
MONOS PCT: 10 %
NEUTROS ABS: 6.7 10*3/uL (ref 1.7–7.7)
Neutrophils Relative %: 72 %
Platelets: 271 10*3/uL (ref 150–400)
RBC: 4.34 MIL/uL (ref 4.22–5.81)
RDW: 12.4 % (ref 11.5–15.5)
WBC: 9.4 10*3/uL (ref 4.0–10.5)

## 2016-01-29 LAB — BASIC METABOLIC PANEL
ANION GAP: 8 (ref 5–15)
BUN: 13 mg/dL (ref 6–20)
CALCIUM: 9 mg/dL (ref 8.9–10.3)
CO2: 22 mmol/L (ref 22–32)
Chloride: 109 mmol/L (ref 101–111)
Creatinine, Ser: 1.03 mg/dL (ref 0.61–1.24)
GFR calc Af Amer: >60 mL/min (ref >60)
GFR calc non Af Amer: >60 mL/min (ref >60)
GLUCOSE: 90 mg/dL (ref 65–99)
Potassium: 3.8 mmol/L (ref 3.5–5.1)
Sodium: 139 mmol/L (ref 135–145)

## 2016-01-29 LAB — GLUCOSE, CAPILLARY: GLUCOSE-CAPILLARY: 78 mg/dL (ref 65–99)

## 2016-01-29 LAB — PROTIME-INR
INR: 0.99
Prothrombin Time: 13.1 seconds (ref 11.4–15.2)

## 2016-01-29 LAB — SURGICAL PCR SCREEN
MRSA, PCR: NEGATIVE
Staphylococcus aureus: NEGATIVE

## 2016-01-29 NOTE — Progress Notes (Addendum)
PCP - Dr. Seward Carol Cardiologist - Dr. Johnsie Cancel    -cardiac clearance note in Epic - 01/13/16  EKG - 12/23/15 CXR - 01/29/16  Echo - 03/2014 Cardiac Cath - 05/2014  Patient denies chest pain and shortness of breath at PAT appointment.   Patient states that he was instructed to take his last dose of Plavix and Aspirin on Thursday, August 3rd.    Patient informs nurse that he does not check his blood sugar at home.  Patient's blood sugar was 78 on arrival to PAT appointment and patient states that he has not had anything to eat or drink this morning.  Nurse educated patient on importance of checking blood sugar every 2 hours the morning of surgery, signs/symptoms of hypoglycemia and how to treat low blood sugar the morning of surgery.  Patient verbalized understanding.

## 2016-01-30 DIAGNOSIS — M150 Primary generalized (osteo)arthritis: Secondary | ICD-10-CM | POA: Diagnosis not present

## 2016-01-30 DIAGNOSIS — Z79891 Long term (current) use of opiate analgesic: Secondary | ICD-10-CM | POA: Diagnosis not present

## 2016-01-30 DIAGNOSIS — G894 Chronic pain syndrome: Secondary | ICD-10-CM | POA: Diagnosis not present

## 2016-01-30 DIAGNOSIS — G5721 Lesion of femoral nerve, right lower limb: Secondary | ICD-10-CM | POA: Diagnosis not present

## 2016-01-30 LAB — HEMOGLOBIN A1C
HEMOGLOBIN A1C: 7.1 % — AB (ref 4.8–5.6)
MEAN PLASMA GLUCOSE: 157 mg/dL

## 2016-01-31 NOTE — Progress Notes (Signed)
Anesthesia Chart Review:  Pt is a 75 year old male scheduled for L1-2 laminectomy and foraminotomy on 02/06/2016 with Sherley Bounds, MD.   Cardiologist is Jenkins Rouge, MD who has cleared pt for surgery.  PCP is Seward Carol, MD.   PMH includes:  CAD (s/p CABG 1996; last cardiac cath 05/2014, 1/4 grafts patent, PTCA and DES to RCA), stroke, PE, HTN, DM, hyperlipidemia. Never smoker. BMI 35.5  Medications include: amlodipine, ASA, plavix, glimepiride, imdur, metformin, metoprolol. Last dose of plavix 01/31/16.   Medications include: HgbA1c 7.1. Glucose 90.  Chest x-ray 01/29/16 reviewed. No acute cardiopulmonary disease. . EKG 12/21/15: Sinus rhythm. Atrial premature complexes  Carotid duplex 08/18/14: carotid arteries are normal bilaterally  Holter monitor 08/16/14: SR. PACs/PVCs. No afib.   Echo 04/10/14:  - Procedure narrative: The rhythm appears to be atrial fibrillation. - Left ventricle: The cavity size was normal. Wall thickness wasincreased in a pattern of mild LVH. Systolic function was normal.The estimated ejection fraction was in the range of 55% to 60%.Wall motion was normal; there were no regional wall motionabnormalities. The study is not technically sufficient to allowevaluation of LV diastolic function. - Aorta: Aortic root dimension: 39 mm (ED). - Aortic root: The aortic root is top normal in size. - Mitral valve: Mildly thickened leaflets . There was mild regurgitation. - Left atrium: Mildly dilated (36 ml/m2). - Impressions: LVEF 55-60%, mild MR, mild LAE, no clear active atrial activity (appears to be a-fib), cannot determine diastolic function.  Cardiac cath 06/16/14:  1. Severe triple vessel CAD s/p 4 V CABG with 1/4 patent grafts 2. Progression of disease in the RCA with restenosis in the mid stented segment and distal stented segment 3. Progression of disease in the target of the SVG to Diagonal. This graft still supplies the LAD but the distal segment of the  diagonal beyond graft insertion is now occluded.  4. Successful PTCA/DES x 1 mid RCA 5. Successful PTCA/cutting balloon angioplasty distal RCA  If no changes, I anticipate pt can proceed with surgery as scheduled.   Willeen Cass, FNP-BC [facility]/Anesthesiology Phone: [phone] 01/31/2016 12:54 PM

## 2016-02-05 MED ORDER — VANCOMYCIN HCL 10 G IV SOLR
1500.0000 mg | INTRAVENOUS | Status: AC
  Administered 2016-02-06: 1500 mg via INTRAVENOUS
  Filled 2016-02-05: qty 1500

## 2016-02-05 MED ORDER — DEXAMETHASONE SODIUM PHOSPHATE 10 MG/ML IJ SOLN
10.0000 mg | INTRAMUSCULAR | Status: DC
  Filled 2016-02-05: qty 1

## 2016-02-06 ENCOUNTER — Encounter (HOSPITAL_COMMUNITY): Payer: Self-pay | Admitting: *Deleted

## 2016-02-06 ENCOUNTER — Ambulatory Visit (HOSPITAL_COMMUNITY): Payer: Medicare Other

## 2016-02-06 ENCOUNTER — Inpatient Hospital Stay (HOSPITAL_COMMUNITY): Payer: Medicare Other | Admitting: Emergency Medicine

## 2016-02-06 ENCOUNTER — Inpatient Hospital Stay (HOSPITAL_COMMUNITY): Payer: Medicare Other | Admitting: Anesthesiology

## 2016-02-06 ENCOUNTER — Ambulatory Visit (HOSPITAL_COMMUNITY)
Admission: RE | Admit: 2016-02-06 | Discharge: 2016-02-07 | Disposition: A | Discharge status: Home / Self Care | Payer: Medicare Other | Source: Ambulatory Visit | Attending: Neurological Surgery | Admitting: Neurological Surgery

## 2016-02-06 ENCOUNTER — Encounter (HOSPITAL_COMMUNITY)
Admission: RE | Disposition: A | Discharge status: Home / Self Care | Payer: Self-pay | Source: Ambulatory Visit | Attending: Neurological Surgery

## 2016-02-06 DIAGNOSIS — I10 Essential (primary) hypertension: Secondary | ICD-10-CM | POA: Insufficient documentation

## 2016-02-06 DIAGNOSIS — Z7984 Long term (current) use of oral hypoglycemic drugs: Secondary | ICD-10-CM | POA: Diagnosis not present

## 2016-02-06 DIAGNOSIS — Z86718 Personal history of other venous thrombosis and embolism: Secondary | ICD-10-CM | POA: Diagnosis not present

## 2016-02-06 DIAGNOSIS — Z86711 Personal history of pulmonary embolism: Secondary | ICD-10-CM | POA: Diagnosis not present

## 2016-02-06 DIAGNOSIS — Z7982 Long term (current) use of aspirin: Secondary | ICD-10-CM | POA: Diagnosis not present

## 2016-02-06 DIAGNOSIS — Z6835 Body mass index (BMI) 35.0-35.9, adult: Secondary | ICD-10-CM | POA: Insufficient documentation

## 2016-02-06 DIAGNOSIS — Z951 Presence of aortocoronary bypass graft: Secondary | ICD-10-CM | POA: Insufficient documentation

## 2016-02-06 DIAGNOSIS — M4806 Spinal stenosis, lumbar region: Principal | ICD-10-CM | POA: Insufficient documentation

## 2016-02-06 DIAGNOSIS — Z955 Presence of coronary angioplasty implant and graft: Secondary | ICD-10-CM | POA: Insufficient documentation

## 2016-02-06 DIAGNOSIS — E119 Type 2 diabetes mellitus without complications: Secondary | ICD-10-CM | POA: Diagnosis not present

## 2016-02-06 DIAGNOSIS — M4316 Spondylolisthesis, lumbar region: Secondary | ICD-10-CM | POA: Diagnosis not present

## 2016-02-06 DIAGNOSIS — I2510 Atherosclerotic heart disease of native coronary artery without angina pectoris: Secondary | ICD-10-CM | POA: Insufficient documentation

## 2016-02-06 DIAGNOSIS — I252 Old myocardial infarction: Secondary | ICD-10-CM | POA: Diagnosis not present

## 2016-02-06 DIAGNOSIS — Z419 Encounter for procedure for purposes other than remedying health state, unspecified: Secondary | ICD-10-CM

## 2016-02-06 DIAGNOSIS — Z9889 Other specified postprocedural states: Secondary | ICD-10-CM

## 2016-02-06 DIAGNOSIS — Z96651 Presence of right artificial knee joint: Secondary | ICD-10-CM | POA: Diagnosis not present

## 2016-02-06 DIAGNOSIS — Z7902 Long term (current) use of antithrombotics/antiplatelets: Secondary | ICD-10-CM | POA: Insufficient documentation

## 2016-02-06 DIAGNOSIS — Z79899 Other long term (current) drug therapy: Secondary | ICD-10-CM | POA: Insufficient documentation

## 2016-02-06 HISTORY — PX: LUMBAR LAMINECTOMY/DECOMPRESSION MICRODISCECTOMY: SHX5026

## 2016-02-06 LAB — GLUCOSE, CAPILLARY
GLUCOSE-CAPILLARY: 133 mg/dL — AB (ref 65–99)
GLUCOSE-CAPILLARY: 101 mg/dL — AB (ref 65–99)
Glucose-Capillary: 169 mg/dL — ABNORMAL HIGH (ref 65–99)

## 2016-02-06 SURGERY — LUMBAR LAMINECTOMY/DECOMPRESSION MICRODISCECTOMY 1 LEVEL
Anesthesia: General | Site: Back

## 2016-02-06 MED ORDER — METOPROLOL SUCCINATE ER 25 MG PO TB24: 100.0000 mg | ORAL_TABLET | Freq: Every day | ORAL | Status: DC

## 2016-02-06 MED ORDER — ROCURONIUM BROMIDE 50 MG/5ML IV SOLN
INTRAVENOUS | Status: AC
  Filled 2016-02-06: qty 1

## 2016-02-06 MED ORDER — METFORMIN HCL 500 MG PO TABS
1000.0000 mg | ORAL_TABLET | Freq: Two times a day (BID) | ORAL | Status: DC
  Administered 2016-02-07: 1000 mg via ORAL
  Filled 2016-02-06: qty 2

## 2016-02-06 MED ORDER — EPHEDRINE 5 MG/ML INJ
INTRAVENOUS | Status: AC
  Filled 2016-02-06: qty 10

## 2016-02-06 MED ORDER — SODIUM CHLORIDE 0.9% FLUSH: 3.0000 mL | Freq: Two times a day (BID) | INTRAVENOUS | Status: DC

## 2016-02-06 MED ORDER — PROPOFOL 10 MG/ML IV BOLUS
INTRAVENOUS | Status: AC
  Filled 2016-02-06: qty 20

## 2016-02-06 MED ORDER — FENTANYL CITRATE (PF) 250 MCG/5ML IJ SOLN
INTRAMUSCULAR | Status: AC
  Filled 2016-02-06: qty 5

## 2016-02-06 MED ORDER — FENTANYL CITRATE (PF) 100 MCG/2ML IJ SOLN
25.0000 ug | INTRAMUSCULAR | Status: DC | PRN
  Administered 2016-02-06: 25 ug via INTRAVENOUS

## 2016-02-06 MED ORDER — SODIUM CHLORIDE 0.9 % IR SOLN
Status: DC | PRN
  Administered 2016-02-06: 15:00:00

## 2016-02-06 MED ORDER — LACTATED RINGERS IV SOLN
INTRAVENOUS | Status: DC
Start: 2016-02-06 — End: 2016-02-06
  Administered 2016-02-06 (×2): via INTRAVENOUS

## 2016-02-06 MED ORDER — FENTANYL CITRATE (PF) 100 MCG/2ML IJ SOLN
INTRAMUSCULAR | Status: AC
  Filled 2016-02-06: qty 2

## 2016-02-06 MED ORDER — ACETAMINOPHEN 325 MG PO TABS: 650.0000 mg | ORAL_TABLET | ORAL | Status: DC | PRN

## 2016-02-06 MED ORDER — FENTANYL CITRATE (PF) 100 MCG/2ML IJ SOLN
INTRAMUSCULAR | Status: DC | PRN
  Administered 2016-02-06: 150 ug via INTRAVENOUS
  Administered 2016-02-06: 100 ug via INTRAVENOUS

## 2016-02-06 MED ORDER — ONDANSETRON HCL 4 MG/2ML IJ SOLN: 4.0000 mg | INTRAMUSCULAR | Status: DC | PRN

## 2016-02-06 MED ORDER — LIDOCAINE 2% (20 MG/ML) 5 ML SYRINGE
INTRAMUSCULAR | Status: AC
  Filled 2016-02-06: qty 5

## 2016-02-06 MED ORDER — GLIMEPIRIDE 2 MG PO TABS: 2.0000 mg | ORAL_TABLET | Freq: Every day | ORAL | Status: DC

## 2016-02-06 MED ORDER — 0.9 % SODIUM CHLORIDE (POUR BTL) OPTIME
TOPICAL | Status: DC | PRN
  Administered 2016-02-06: 1000 mL

## 2016-02-06 MED ORDER — ASPIRIN 81 MG PO CHEW: 81.0000 mg | CHEWABLE_TABLET | Freq: Every day | ORAL | Status: DC

## 2016-02-06 MED ORDER — ISOSORBIDE MONONITRATE ER 60 MG PO TB24
60.0000 mg | ORAL_TABLET | Freq: Every day | ORAL | Status: DC
  Administered 2016-02-06: 60 mg via ORAL
  Filled 2016-02-06: qty 1

## 2016-02-06 MED ORDER — PHENYLEPHRINE HCL 10 MG/ML IJ SOLN
INTRAVENOUS | Status: DC | PRN
  Administered 2016-02-06: 50 ug/min via INTRAVENOUS

## 2016-02-06 MED ORDER — OXYCODONE HCL 5 MG PO TABS
5.0000 mg | ORAL_TABLET | ORAL | Status: DC | PRN
  Administered 2016-02-06 – 2016-02-07 (×4): 5 mg via ORAL
  Filled 2016-02-06 (×4): qty 1

## 2016-02-06 MED ORDER — POTASSIUM CHLORIDE IN NACL 20-0.9 MEQ/L-% IV SOLN
INTRAVENOUS | Status: DC
  Filled 2016-02-06: qty 1000

## 2016-02-06 MED ORDER — VANCOMYCIN HCL IN DEXTROSE 1-5 GM/200ML-% IV SOLN
1000.0000 mg | Freq: Once | INTRAVENOUS | Status: AC
  Administered 2016-02-07: 1000 mg via INTRAVENOUS
  Filled 2016-02-06: qty 200

## 2016-02-06 MED ORDER — ROCURONIUM BROMIDE 100 MG/10ML IV SOLN
INTRAVENOUS | Status: DC | PRN
  Administered 2016-02-06: 50 mg via INTRAVENOUS

## 2016-02-06 MED ORDER — SUGAMMADEX SODIUM 200 MG/2ML IV SOLN
INTRAVENOUS | Status: DC | PRN
  Administered 2016-02-06: 206 mg via INTRAVENOUS

## 2016-02-06 MED ORDER — SUGAMMADEX SODIUM 500 MG/5ML IV SOLN
INTRAVENOUS | Status: AC
  Filled 2016-02-06: qty 5

## 2016-02-06 MED ORDER — ONDANSETRON HCL 4 MG/2ML IJ SOLN
INTRAMUSCULAR | Status: AC
  Filled 2016-02-06: qty 2

## 2016-02-06 MED ORDER — THROMBIN 5000 UNITS EX SOLR
CUTANEOUS | Status: DC | PRN
  Administered 2016-02-06 (×2): 5000 [IU] via TOPICAL

## 2016-02-06 MED ORDER — PHENYLEPHRINE HCL 10 MG/ML IJ SOLN
INTRAMUSCULAR | Status: DC | PRN
  Administered 2016-02-06: 120 ug via INTRAVENOUS

## 2016-02-06 MED ORDER — ACETAMINOPHEN 650 MG RE SUPP: 650.0000 mg | RECTAL | Status: DC | PRN

## 2016-02-06 MED ORDER — AMLODIPINE BESYLATE 10 MG PO TABS: 10.0000 mg | ORAL_TABLET | Freq: Every day | ORAL | Status: DC

## 2016-02-06 MED ORDER — SODIUM CHLORIDE 0.9 % IV SOLN: 250.0000 mL | INTRAVENOUS | Status: DC

## 2016-02-06 MED ORDER — SODIUM CHLORIDE 0.9% FLUSH: 3.0000 mL | INTRAVENOUS | Status: DC | PRN

## 2016-02-06 MED ORDER — MENTHOL 3 MG MT LOZG: 1.0000 | LOZENGE | OROMUCOSAL | Status: DC | PRN

## 2016-02-06 MED ORDER — THROMBIN 5000 UNITS EX SOLR
OROMUCOSAL | Status: DC | PRN
  Administered 2016-02-06: 15:00:00 via TOPICAL

## 2016-02-06 MED ORDER — EPHEDRINE SULFATE 50 MG/ML IJ SOLN
INTRAMUSCULAR | Status: DC | PRN
  Administered 2016-02-06: 5 mg via INTRAVENOUS

## 2016-02-06 MED ORDER — BUPIVACAINE HCL (PF) 0.25 % IJ SOLN
INTRAMUSCULAR | Status: DC | PRN
  Administered 2016-02-06: 10 mL

## 2016-02-06 MED ORDER — PHENOL 1.4 % MT LIQD: 1.0000 | OROMUCOSAL | Status: DC | PRN

## 2016-02-06 MED ORDER — METOPROLOL SUCCINATE ER 25 MG PO TB24
50.0000 mg | ORAL_TABLET | Freq: Two times a day (BID) | ORAL | Status: DC
Start: 2016-02-06 — End: 2016-02-06

## 2016-02-06 MED ORDER — OXYCODONE-ACETAMINOPHEN 10-325 MG PO TABS: 1.0000 | ORAL_TABLET | ORAL | Status: DC | PRN

## 2016-02-06 MED ORDER — OXYCODONE-ACETAMINOPHEN 5-325 MG PO TABS
1.0000 | ORAL_TABLET | ORAL | Status: DC | PRN
  Administered 2016-02-06 – 2016-02-07 (×4): 1 via ORAL
  Filled 2016-02-06 (×4): qty 1

## 2016-02-06 MED ORDER — MORPHINE SULFATE (PF) 2 MG/ML IV SOLN: 1.0000 mg | INTRAVENOUS | Status: DC | PRN

## 2016-02-06 MED ORDER — METOPROLOL SUCCINATE ER 25 MG PO TB24
50.0000 mg | ORAL_TABLET | Freq: Every evening | ORAL | Status: DC
  Administered 2016-02-06: 50 mg via ORAL
  Filled 2016-02-06: qty 2

## 2016-02-06 MED ORDER — ARTIFICIAL TEARS OP OINT
TOPICAL_OINTMENT | OPHTHALMIC | Status: AC
  Filled 2016-02-06: qty 3.5

## 2016-02-06 MED ORDER — GLIMEPIRIDE 2 MG PO TABS
2.0000 mg | ORAL_TABLET | Freq: Every day | ORAL | Status: DC
  Administered 2016-02-07: 2 mg via ORAL
  Filled 2016-02-06: qty 1

## 2016-02-06 MED ORDER — PHENYLEPHRINE 40 MCG/ML (10ML) SYRINGE FOR IV PUSH (FOR BLOOD PRESSURE SUPPORT)
PREFILLED_SYRINGE | INTRAVENOUS | Status: AC
  Filled 2016-02-06: qty 10

## 2016-02-06 MED ORDER — PROPOFOL 10 MG/ML IV BOLUS
INTRAVENOUS | Status: DC | PRN
  Administered 2016-02-06: 130 mg via INTRAVENOUS

## 2016-02-06 MED ORDER — ARTIFICIAL TEARS OP OINT
TOPICAL_OINTMENT | OPHTHALMIC | Status: DC | PRN
  Administered 2016-02-06: 1 via OPHTHALMIC

## 2016-02-06 MED ORDER — LIDOCAINE HCL (CARDIAC) 20 MG/ML IV SOLN
INTRAVENOUS | Status: DC | PRN
  Administered 2016-02-06: 100 mg via INTRAVENOUS

## 2016-02-06 MED ORDER — HEMOSTATIC AGENTS (NO CHARGE) OPTIME
TOPICAL | Status: DC | PRN
  Administered 2016-02-06: 1 via TOPICAL

## 2016-02-06 SURGICAL SUPPLY — 42 items
APL SKNCLS STERI-STRIP NONHPOA (GAUZE/BANDAGES/DRESSINGS) ×1
BAG DECANTER FOR FLEXI CONT (MISCELLANEOUS) ×3 IMPLANT
BENZOIN TINCTURE PRP APPL 2/3 (GAUZE/BANDAGES/DRESSINGS) ×3 IMPLANT
BUR MATCHSTICK NEURO 3.0 LAGG (BURR) ×3 IMPLANT
CANISTER SUCT 3000ML PPV (MISCELLANEOUS) ×3 IMPLANT
CLOSURE WOUND 1/2 X4 (GAUZE/BANDAGES/DRESSINGS) ×1
DRAPE LAPAROTOMY 100X72X124 (DRAPES) ×3 IMPLANT
DRAPE MICROSCOPE LEICA (MISCELLANEOUS) ×1 IMPLANT
DRAPE POUCH INSTRU U-SHP 10X18 (DRAPES) ×3 IMPLANT
DRAPE SURG 17X23 STRL (DRAPES) ×3 IMPLANT
DRSG OPSITE POSTOP 3X4 (GAUZE/BANDAGES/DRESSINGS) ×2 IMPLANT
DURAPREP 26ML APPLICATOR (WOUND CARE) ×3 IMPLANT
ELECT REM PT RETURN 9FT ADLT (ELECTROSURGICAL) ×3
ELECTRODE REM PT RTRN 9FT ADLT (ELECTROSURGICAL) ×1 IMPLANT
GAUZE SPONGE 4X4 16PLY XRAY LF (GAUZE/BANDAGES/DRESSINGS) IMPLANT
GLOVE BIO SURGEON STRL SZ8 (GLOVE) ×3 IMPLANT
GLOVE ECLIPSE 6.5 STRL STRAW (GLOVE) ×3 IMPLANT
GOWN STRL REUS W/ TWL LRG LVL3 (GOWN DISPOSABLE) IMPLANT
GOWN STRL REUS W/ TWL XL LVL3 (GOWN DISPOSABLE) ×1 IMPLANT
GOWN STRL REUS W/TWL 2XL LVL3 (GOWN DISPOSABLE) IMPLANT
GOWN STRL REUS W/TWL LRG LVL3 (GOWN DISPOSABLE)
GOWN STRL REUS W/TWL XL LVL3 (GOWN DISPOSABLE) ×3
HEMOSTAT POWDER KIT SURGIFOAM (HEMOSTASIS) ×2 IMPLANT
KIT BASIN OR (CUSTOM PROCEDURE TRAY) ×3 IMPLANT
KIT ROOM TURNOVER OR (KITS) ×3 IMPLANT
NDL HYPO 25X1 1.5 SAFETY (NEEDLE) ×1 IMPLANT
NDL SPNL 20GX3.5 QUINCKE YW (NEEDLE) IMPLANT
NEEDLE HYPO 25X1 1.5 SAFETY (NEEDLE) ×3 IMPLANT
NEEDLE SPNL 20GX3.5 QUINCKE YW (NEEDLE) IMPLANT
NS IRRIG 1000ML POUR BTL (IV SOLUTION) ×3 IMPLANT
PACK LAMINECTOMY NEURO (CUSTOM PROCEDURE TRAY) ×3 IMPLANT
PAD ARMBOARD 7.5X6 YLW CONV (MISCELLANEOUS) ×9 IMPLANT
RUBBERBAND STERILE (MISCELLANEOUS) IMPLANT
SPONGE SURGIFOAM ABS GEL SZ50 (HEMOSTASIS) ×3 IMPLANT
STRIP CLOSURE SKIN 1/2X4 (GAUZE/BANDAGES/DRESSINGS) ×2 IMPLANT
SUT VIC AB 0 CT1 18XCR BRD8 (SUTURE) ×1 IMPLANT
SUT VIC AB 0 CT1 8-18 (SUTURE) ×3
SUT VIC AB 2-0 CP2 18 (SUTURE) ×3 IMPLANT
SUT VIC AB 3-0 SH 8-18 (SUTURE) ×3 IMPLANT
TOWEL OR 17X24 6PK STRL BLUE (TOWEL DISPOSABLE) ×3 IMPLANT
TOWEL OR 17X26 10 PK STRL BLUE (TOWEL DISPOSABLE) ×3 IMPLANT
WATER STERILE IRR 1000ML POUR (IV SOLUTION) ×3 IMPLANT

## 2016-02-06 NOTE — Anesthesia Procedure Notes (Signed)
Procedure Name: Intubation Date/Time: 02/06/2016 1:51 PM Performed by: Lance Coon Pre-anesthesia Checklist: Patient identified, Emergency Drugs available, Suction available and Patient being monitored Patient Re-evaluated:Patient Re-evaluated prior to inductionOxygen Delivery Method: Circle system utilized Preoxygenation: Pre-oxygenation with 100% oxygen Intubation Type: IV induction Ventilation: Oral airway inserted - appropriate to patient size Laryngoscope Size: Miller and 3 Grade View: Grade I Tube type: Oral Tube size: 7.5 mm Number of attempts: 1 Airway Equipment and Method: Stylet Placement Confirmation: ETT inserted through vocal cords under direct vision,  positive ETCO2 and breath sounds checked- equal and bilateral Secured at: 23 cm Tube secured with: Tape Dental Injury: Teeth and Oropharynx as per pre-operative assessment

## 2016-02-06 NOTE — Progress Notes (Signed)
Pharmacy consulted for vancomycin dosing post decompressive lumbar laminectomy in setting of PCN allergy. No noted drain in place. Will give vancomycin 1000 mg x 1 on 8/10 am. Will s/o plan re consult if needed.  Vincenza Hews, PharmD, BCPS 02/06/2016, 5:28 PM Pager: [phone]

## 2016-02-06 NOTE — H&P (Signed)
Subjective: Patient is a 75 y.o. male admitted for spinal stenosis. Onset of symptoms was several years ago, gradually worsening since that time.  The pain is rated severe, and is located at the across the lower back and radiates to legs. The pain is described as aching and occurs all day. The symptoms have been progressive. Symptoms are exacerbated by exercise. MRI or CT showed stenosis l1-2   Past Medical History:  Diagnosis Date  . CAD (coronary artery disease)    a. s/p CABG; b. s/p diffuse stenting to RCA; c. 3/4 grafts known to be occluded;  d.  LHC 12/15:  pLAD 100, small CFx diff disz - no amenable to PCI, RCA severe mid and dist ISR, S-OM, S-RCA 100, L-LAD atretic, S-Dx ok (dist seg of Dx 100) >> PCI:  DES to mRCA and POBA/cutting balloon to dist RCA   . Depression   . DM (diabetes mellitus) ([place])    Type 2  . Dyslipidemia   . Hemorrhoids    internal  . History of Doppler ultrasound    Carotid US (2/16):  Normal carotid arteries bilaterally.  Marland Kitchen History of hiatal hernia   . History of kidney stones   . History of pneumonia   . History of seizures    "when I was hospitalized for stroke"  . Hx of echocardiogram    a. Echo 10/15:  mild LVH, EF 55-60%, no RWMA, Ao root 39 mm, mild MR, mild LAE  . Hypertension   . Leg edema    right  . Myocardial infarction ([place])   . Nephrolithiasis   . Nocturia   . Obesity    morbid  . Osteoarthritis   . Pulmonary embolism ([place])    dx 03/2014 >> On Apixaban  . Stroke ([place]) \   no deficits  . Wears glasses     Past Surgical History:  Procedure Laterality Date  . ANGIOPLASTY / STENTING ILIAC    . BACK SURGERY     x2  . COLONOSCOPY    . Heart Bypass  1996  . LEFT HEART CATHETERIZATION WITH CORONARY/GRAFT ANGIOGRAM N/A 06/16/2014   Procedure: LEFT HEART CATHETERIZATION WITH Beatrix Fetters;  Surgeon: Burnell Blanks, MD;  Location: [facility] C[facility] LAB;  Service: Cardiovascular;  Laterality: N/A;  . PERCUTANEOUS CORONARY STENT  INTERVENTION (PCI-S)  06/16/2014   Procedure: PERCUTANEOUS CORONARY STENT INTERVENTION (PCI-S);  Surgeon: Burnell Blanks, MD;  Location: [facility] CATH LAB;  Service: Cardiovascular;;  MID RCA, Cutting Balloon and Stent Distal RCA, Cutting Balloon   . TOTAL KNEE ARTHROPLASTY     right  . UMBILICAL HERNIA REPAIR      Prior to Admission medications   Medication Sig Start Date End Date Taking? Authorizing Provider  amLODipine (NORVASC) 10 MG tablet Take 1 tablet (10 mg total) by mouth at bedtime. 02/02/15  Yes Josue Hector, MD  glimepiride (AMARYL) 2 MG tablet Take 2 mg by mouth daily. 03/02/15  Yes Historical Provider, MD  glimepiride (AMARYL) 2 MG tablet Take 2 mg by mouth daily with breakfast.   Yes Historical Provider, MD  metFORMIN (GLUCOPHAGE) 500 MG tablet Take 2 tablets (1,000 mg total) by mouth 2 (two) times daily with a meal. 06/18/14  Yes Doreene Burke Kilroy, PA-C  metoprolol succinate (TOPROL-XL) 100 MG 24 hr tablet Take 50-100 mg by mouth 2 (two) times daily. TAKE 1 TABLET BY MOUTH IN THE AM & 1/2 TABLET BY MOUTH IN THE PM.  100 mg in the morning and 50  mg every evening   Yes Historical Provider, MD  NON FORMULARY Take 1 tablet by mouth daily. Take daily () Vitamin D3 2000 mg, Calcium 333 mg, magnesium 133 mg, zinc 5mg .)   Yes Historical Provider, MD  oxyCODONE-acetaminophen (PERCOCET) 10-325 MG per tablet Take 1 tablet by mouth every 4 (four) hours as needed for pain.  06/09/14  Yes Historical Provider, MD  aspirin 81 MG tablet Take 81 mg by mouth daily.    Historical Provider, MD  clopidogrel (PLAVIX) 75 MG tablet Take 1 tablet (75 mg total) by mouth daily with breakfast. 09/21/15   Josue Hector, MD  isosorbide mononitrate (IMDUR) 60 MG 24 hr tablet Take 1 tablet (60 mg total) by mouth at bedtime. 04/13/15   Josue Hector, MD  methylPREDNISolone (MEDROL DOSEPAK) 4 MG TBPK tablet Take as directed. Patient not taking: Reported on 01/23/2016 12/21/15   Elnora Morrison, MD  nitroGLYCERIN  (NITROSTAT) 0.4 MG SL tablet Place 1 tablet (0.4 mg total) under the tongue every 5 (five) minutes as needed for chest pain. 07/06/14   Josue Hector, MD   Allergies  Allergen Reactions  . Penicillins Swelling and Other (See Comments)    Redness Has patient had a PCN reaction causing immediate rash, facial/tongue/throat swelling, SOB or lightheadedness with hypotension: No Has patient had a PCN reaction causing severe rash involving mucus membranes or skin necrosis: No Has patient had a PCN reaction that required hospitalization No Has patient had a PCN reaction occurring within the last 10 years: Non If all of the above answers are "NO", then may proceed with Cephalosporin use.   . Statins Other (See Comments)      muscle pain    Social History  Substance Use Topics  . Smoking status: Never Smoker  . Smokeless tobacco: Never Used  . Alcohol use Yes     Comment: Rarely    Family History  Problem Relation Age of Onset  . Stroke Mother   . Diabetes Mother   . Heart attack Neg Hx      Review of Systems  Positive ROS: neg  All other systems have been reviewed and were otherwise negative with the exception of those mentioned in the HPI and as above.  Objective: Vital signs in last 24 hours: Temp:  [98.2 F (36.8 C)] 98.2 F (36.8 C) (08/09 1302) Pulse Rate:  [66] 66 (08/09 1302) Resp:  [20] 20 (08/09 1302) BP: (141)/(50) 141/50 (08/09 1302) SpO2:  [98 %] 98 % (08/09 1302) Weight:  [103 kg (227 lb)] 103 kg (227 lb) (08/09 1259)  General Appearance: Alert, cooperative, no distress, appears stated age Head: Normocephalic, without obvious abnormality, atraumatic Eyes: PERRL, conjunctiva/corneas clear, EOM's intact    Neck: Supple, symmetrical, trachea midline Back: Symmetric, no curvature, ROM normal, no CVA tenderness Lungs:  respirations unlabored Heart: Regular rate and rhythm Abdomen: Soft, non-tender Extremities: Extremities normal, atraumatic, no cyanosis or  edema Pulses: 2+ and symmetric all extremities Skin: Skin color, texture, turgor normal, no rashes or lesions  NEUROLOGIC:   Mental status: Alert and oriented x4,  no aphasia, good attention span, fund of knowledge, and memory Motor Exam - grossly normal Sensory Exam - grossly normal Reflexes: 1+ Coordination - grossly normal Gait - grossly normal Balance - grossly normal Cranial Nerves: I: smell Not tested  II: visual acuity  OS: nl    OD: nl  II: visual fields Full to confrontation  II: pupils Equal, round, reactive to light  III,VII: ptosis  None  III,IV,VI: extraocular muscles  Full ROM  V: mastication Normal  V: facial light touch sensation  Normal  V,VII: corneal reflex  Present  VII: facial muscle function - upper  Normal  VII: facial muscle function - lower Normal  VIII: hearing Not tested  IX: soft palate elevation  Normal  IX,X: gag reflex Present  XI: trapezius strength  5/5  XI: sternocleidomastoid strength 5/5  XI: neck flexion strength  5/5  XII: tongue strength  Normal    Data Review Lab Results  Component Value Date   WBC 9.4 01/29/2016   HGB 13.7 01/29/2016   HCT 41.9 01/29/2016   MCV 96.5 01/29/2016   PLT 271 01/29/2016   Lab Results  Component Value Date   NA 139 01/29/2016   K 3.8 01/29/2016   CL 109 01/29/2016   CO2 22 01/29/2016   BUN 13 01/29/2016   CREATININE 1.03 01/29/2016   GLUCOSE 90 01/29/2016   Lab Results  Component Value Date   INR 0.99 01/29/2016    Assessment/Plan: Patient admitted for DLL L1-2. Patient has failed a reasonable attempt at conservative therapy.  I explained the condition and procedure to the patient and answered any questions.  Patient wishes to proceed with procedure as planned. Understands risks/ benefits and typical outcomes of procedure.   JONES,DAVID S 02/06/2016 1:43 PM

## 2016-02-06 NOTE — Anesthesia Preprocedure Evaluation (Addendum)
Anesthesia Evaluation  Patient identified by MRN, date of birth, ID band Patient awake    Reviewed: Allergy & Precautions, NPO status , Patient's Chart, lab work & pertinent test results  Airway Mallampati: II  TM Distance: >3 FB Neck ROM: Full   Comment: Full beard Dental no notable dental hx. (+) Poor Dentition   Pulmonary neg pulmonary ROS,    Pulmonary exam normal        Cardiovascular hypertension, Pt. on medications + CAD, + Past MI, + Cardiac Stents, + CABG, + Peripheral Vascular Disease and + DVT  Normal cardiovascular exam  CAD (s/p CABG 1996; last cardiac cath 05/2014, 1/4 grafts patent, PTCA and DES to RCA. No new chest pain and Cardiologist note from approximately one week ago deems him fit for surgery.   History of PE requiring chronic AC  ejection fraction was in the range of 55% to 60%. Wall motion was normal; there were no regional wall motion abnormalities. The study is not technically sufficient to allow evaluation of LV diastolic function.   Neuro/Psych PSYCHIATRIC DISORDERS Depression CVA, No Residual Symptoms negative neurological ROS     GI/Hepatic negative GI ROS, Neg liver ROS, GERD  ,  Endo/Other  negative endocrine ROSdiabetes, Type 2  Renal/GU negative Renal ROS     Musculoskeletal negative musculoskeletal ROS (+) Arthritis ,   Abdominal   Peds  Hematology   Anesthesia Other Findings Day of surgery medications reviewed with the patient.  Reproductive/Obstetrics                           Anesthesia Physical Anesthesia Plan  ASA: III  Anesthesia Plan: General   Post-op Pain Management:    Induction: Intravenous  Airway Management Planned: Oral ETT  Additional Equipment: None  Intra-op Plan:   Post-operative Plan: Extubation in OR  Informed Consent: I have reviewed the patients History and Physical, chart, labs and discussed the procedure  including the risks, benefits and alternatives for the proposed anesthesia with the patient or authorized representative who has indicated his/her understanding and acceptance.   Dental advisory given  Plan Discussed with: CRNA and Surgeon  Anesthesia Plan Comments:        Anesthesia Quick Evaluation

## 2016-02-06 NOTE — Transfer of Care (Signed)
Immediate Anesthesia Transfer of Care Note  Patient: Carlos Herman  Procedure(s) Performed: Procedure(s): Laminectomy and Foraminotomy - Lumbar one-Lumbar two (N/A)  Patient Location: PACU  Anesthesia Type:General  Level of Consciousness: awake and alert   Airway & Oxygen Therapy: Patient Spontanous Breathing and Patient connected to face mask oxygen  Post-op Assessment: Report given to RN, Post -op Vital signs reviewed and stable and Patient moving all extremities X 4  Post vital signs: Reviewed and stable  Last Vitals:  Vitals:   02/06/16 1302 02/06/16 1504  BP: (!) 141/50   Pulse: 66   Resp: 20   Temp: 36.8 C (P) 36.4 C    Last Pain:  Vitals:   02/06/16 1259  PainSc: 3       Patients Stated Pain Goal: 3 ([id number])  Complications: No apparent anesthesia complications

## 2016-02-06 NOTE — Op Note (Signed)
02/06/2016  2:52 PM  PATIENT:  Carlos Herman  75 y.o. male  PRE-OPERATIVE DIAGNOSIS:  Lumbar spinal stenosis L1 2  POST-OPERATIVE DIAGNOSIS:  same  PROCEDURE:  Decompressive lumbar laminectomy and medial facetectomy and foraminotomy L1-2 bilaterally  SURGEON:  Sherley Bounds, MD  ASSISTANTS: Dr. Christella Noa  ANESTHESIA:   General  EBL: 50 ml  Total I/O In: 1200 [I.V.:1200] Out: 50 [Blood:50]  BLOOD ADMINISTERED:none  DRAINS: none   SPECIMEN:  No Specimen  INDICATION FOR PROCEDURE: This patient presented with a feeling of pain and weakness in his legs. MRI showed progressive spinal stenosis at L1-2. He tried medical management without relief. I recommended decompressive laminectomy. Patient understood the risks, benefits, and alternatives and potential outcomes and wished to proceed.  PROCEDURE DETAILS: The patient was taken to the operating room and after induction of adequate generalized endotracheal anesthesia, the patient was rolled into the prone position on the Wilson frame and all pressure points were padded. The lumbar region was cleaned and then prepped with DuraPrep and draped in the usual sterile fashion. 5 cc of local anesthesia was injected and then a dorsal midline incision was made and carried down to the lumbo sacral fascia. The fascia was opened and the paraspinous musculature was taken down in a subperiosteal fashion to expose L1-2 bilaterally. Intraoperative x-ray confirmed my level, and then I used a combination of the high-speed drill and the Kerrison punches to perform a hemilaminectomy, medial facetectomy, and foraminotomy at L1-2 bilaterally. The underlying yellow ligament was opened and removed in a piecemeal fashion to expose the underlying dura and exiting nerve root. I undercut the lateral recess and dissected down until I was medial to and distal to the pedicle. The nerve root was well decompressed. I then palpated with a coronary dilator along the nerve root  and into the foramen to assure adequate decompression. I felt no more compression of the nerve root. I irrigated with saline solution containing bacitracin. Achieved hemostasis with bipolar cautery, lined the dura with Gelfoam, and then closed the fascia with 0 Vicryl. I closed the subcutaneous tissues with 2-0 Vicryl and the subcuticular tissues with 3-0 Vicryl. The skin was then closed with benzoin and Steri-Strips. The drapes were removed, a sterile dressing was applied. The patient was awakened from general anesthesia and transferred to the recovery room in stable condition. At the end of the procedure all sponge, needle and instrument counts were correct.   PLAN OF CARE: Admit for overnight observation  PATIENT DISPOSITION:  PACU - hemodynamically stable.   Delay start of Pharmacological VTE agent (>24hrs) due to surgical blood loss or risk of bleeding:  yes

## 2016-02-06 NOTE — H&P (Signed)
  Pt's H&P is under the consult section

## 2016-02-07 ENCOUNTER — Encounter (HOSPITAL_COMMUNITY): Payer: Self-pay | Admitting: Neurological Surgery

## 2016-02-07 DIAGNOSIS — M4806 Spinal stenosis, lumbar region: Secondary | ICD-10-CM | POA: Diagnosis not present

## 2016-02-07 DIAGNOSIS — I252 Old myocardial infarction: Secondary | ICD-10-CM | POA: Diagnosis not present

## 2016-02-07 DIAGNOSIS — E119 Type 2 diabetes mellitus without complications: Secondary | ICD-10-CM | POA: Diagnosis not present

## 2016-02-07 DIAGNOSIS — I2510 Atherosclerotic heart disease of native coronary artery without angina pectoris: Secondary | ICD-10-CM | POA: Diagnosis not present

## 2016-02-07 DIAGNOSIS — I10 Essential (primary) hypertension: Secondary | ICD-10-CM | POA: Diagnosis not present

## 2016-02-07 DIAGNOSIS — Z951 Presence of aortocoronary bypass graft: Secondary | ICD-10-CM | POA: Diagnosis not present

## 2016-02-07 LAB — GLUCOSE, CAPILLARY
GLUCOSE-CAPILLARY: 143 mg/dL — AB (ref 65–99)
Glucose-Capillary: 104 mg/dL — ABNORMAL HIGH (ref 65–99)

## 2016-02-07 MED ORDER — OXYCODONE-ACETAMINOPHEN 10-325 MG PO TABS: 1.0000 | ORAL_TABLET | ORAL | 0 refills | Status: DC | PRN

## 2016-02-07 NOTE — Evaluation (Signed)
Physical Therapy Evaluation Patient Details Name: Carlos Herman MRN: [id number] DOB: [birth year] Today's Date: 02/07/2016   History of Present Illness  Pt admitted for L1-2 laminectomy. PMHx: R TKA, CAD, DM  Clinical Impression  Pt seated in chair on arrival. Pt states he has pain all over his body, has all kinds of equipment and will be by himself. Pt educated for back precautions, recommendation for use of DME to prevent falls and assist with gait and to have assist for laundry not to carry up the flight from the basement. Son present and informed as well but pt not receptive to recommendations and states he will get along just fine. Pt with decreased strength, function, gait and balance who will benefit from acute therapy to maximize mobility, activity and independence to decrease fall risk and adhere to precautions.     Follow Up Recommendations Home health PT    Equipment Recommendations  None recommended by PT    Recommendations for Other Services       Precautions / Restrictions Precautions Precautions: Back;Fall Precaution Booklet Issued: Yes (comment)      Mobility  Bed Mobility Overal bed mobility: Needs Assistance Bed Mobility: Rolling;Sidelying to Sit;Sit to Sidelying Rolling: Supervision Sidelying to sit: Supervision     Sit to sidelying: Supervision General bed mobility comments: cues for sequence and safety not to twist  Transfers Overall transfer level: Needs assistance   Transfers: Sit to/from Stand Sit to Stand: Supervision         General transfer comment: cues for posture and precautions  Ambulation/Gait Ambulation/Gait assistance: Min guard Ambulation Distance (Feet): 150 Feet Assistive device: None Gait Pattern/deviations: Step-through pattern;Decreased stride length;Trunk flexed;Wide base of support;Antalgic   Gait velocity interpretation: Below normal speed for age/gender General Gait Details: pt with antalgic unsteady gait, reaching  for support x 1. Pt denied use of RW or cane  Stairs            Wheelchair Mobility    Modified Rankin (Stroke Patients Only)       Balance Overall balance assessment: Needs assistance;History of Falls   Sitting balance-Leahy Scale: Good       Standing balance-Leahy Scale: Good                               Pertinent Vitals/Pain Pain Assessment: 0-10 Pain Score: 9  Pain Location: "everywhere" Pain Descriptors / Indicators: Aching;Throbbing Pain Intervention(s): Limited activity within patient's tolerance;Premedicated before session    [place] expects to be discharged to:: Private residence Living Arrangements: Alone Available Help at Discharge: Family;Available PRN/intermittently Type of Home: House Home Access: Ramped entrance     Home Layout: Two level;Laundry or work area in basement;Able to live on main level with bedroom/bathroom Home Equipment: Environmental consultant - 2 wheels;Cane - single point;Bedside commode;Adaptive equipment Additional Comments: Pt reports "I have all that stuff" but would not fully state in detail his home equipment available    Prior Function Level of Independence: Independent               Hand Dominance        Extremity/Trunk Assessment   Upper Extremity Assessment: Overall WFL for tasks assessed           Lower Extremity Assessment: Generalized weakness (pt not agreeable to formal assessment, required assist to cross legs)      Cervical / Trunk Assessment: Kyphotic  Communication   Communication: No difficulties  Cognition  Arousal/Alertness: Awake/alert Behavior During Therapy: Agitated Overall Cognitive Status: Within Functional Limits for tasks assessed                      General Comments General comments (skin integrity, edema, etc.): pt with at least 2 falls in the last year but would not elaborate    Exercises        Assessment/Plan    PT Assessment Patient needs  continued PT services  PT Diagnosis Difficulty walking;Acute pain   PT Problem List Decreased strength;Decreased activity tolerance;Decreased balance;Decreased mobility;Pain  PT Treatment Interventions Gait training;DME instruction;Therapeutic activities;Functional mobility training   PT Goals (Current goals can be found in the Care Plan section) Acute Rehab PT Goals Patient Stated Goal: return home PT Goal Formulation: With patient Time For Goal Achievement: 02/14/16 Potential to Achieve Goals: Fair    Frequency Min 5X/week   Barriers to discharge Decreased caregiver support      Co-evaluation               End of Session   Activity Tolerance: Patient tolerated treatment well Patient left: in chair;with call bell/phone within reach;with family/visitor present Nurse Communication: Mobility status;Precautions    Functional Assessment Tool Used: clinical judgement Functional Limitation: Mobility: Walking and moving around Mobility: Walking and Moving Around Current Status [id number]): At least 1 percent but less than 20 percent impaired, limited or restricted Mobility: Walking and Moving Around Goal Status [phone]): At least 1 percent but less than 20 percent impaired, limited or restricted    Time: 1010-1026 PT Time Calculation (min) (ACUTE ONLY): 16 min   Charges:   PT Evaluation $PT Eval Moderate Complexity: 1 Procedure     PT G Codes:   PT G-Codes **NOT FOR INPATIENT CLASS** Functional Assessment Tool Used: clinical judgement Functional Limitation: Mobility: Walking and moving around Mobility: Walking and Moving Around Current Status [id number]): At least 1 percent but less than 20 percent impaired, limited or restricted Mobility: Walking and Moving Around Goal Status [phone]): At least 1 percent but less than 20 percent impaired, limited or restricted    Melford Aase 02/07/2016, 10:36 AM  Elwyn Reach, [place]

## 2016-02-07 NOTE — Discharge Summary (Signed)
Physician Discharge Summary  Patient ID: Carlos Herman MRN: [id number] DOB/AGE: [birth year] 75 y.o.  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 The patient tolerated the procedure well and was taken to the recovery room and then to the floor in stable condition. The hospital course was routine. There were no complications. The wound remained clean dry and intact. Pt had appropriate back soreness. No complaints of leg pain or new N/T/W. The patient remained afebrile with stable vital signs, and tolerated a regular diet. The patient continued to increase activities, and pain was well controlled with oral pain medications.   Consults: None  Significant Diagnostic Studies:  Results for orders placed or performed during the hospital encounter of 02/06/16  Glucose, capillary  Result Value Ref Range   Glucose-Capillary 133 (H) 65 - 99 mg/dL  Glucose, capillary  Result Value Ref Range   Glucose-Capillary 101 (H) 65 - 99 mg/dL  Glucose, capillary  Result Value Ref Range   Glucose-Capillary 169 (H) 65 - 99 mg/dL   Comment 1 Notify RN    Comment 2 Document in Chart   Glucose, capillary  Result Value Ref Range   Glucose-Capillary 143 (H) 65 - 99 mg/dL  Glucose, capillary  Result Value Ref Range   Glucose-Capillary 104 (H) 65 - 99 mg/dL    Chest 2 View  Result Date: 01/29/2016 CLINICAL DATA:  Pre-op films for lumbar laminectomy, no chest complaints. 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 Cardiac silhouette is normal in size and configuration. No mediastinal or hilar masses or evidence of adenopathy. Clear lungs.  No pleural effusion or pneumothorax. Bony thorax is demineralized but intact. IMPRESSION: No  acute cardiopulmonary disease. Electronically Signed   By: Lajean Manes M.D.   On: 01/29/2016 09:15   Dg Lumbar Spine 1 View  Result Date: 02/06/2016 CLINICAL DATA:  L1-2 laminectomy EXAM: LUMBAR SPINE - 1 VIEW COMPARISON:  12/21/2015 FINDINGS: Surgical retractor in instruments are noted posterior to the L1-L2 interspace. Changes of prior posterior fixator are noted at L4-5. Anterolisthesis of L4 on L5 is again noted and stable. IMPRESSION: Intraoperative localization at L1-2 Electronically Signed   By: Inez Catalina M.D.   On: 02/06/2016 15:29    Antibiotics:  Anti-infectives    Start     Dose/Rate Route Frequency Ordered Stop   02/07/16 0300  vancomycin (VANCOCIN) IVPB 1000 mg/200 mL premix     1,000 mg 200 mL/hr over 60 Minutes Intravenous  Once 02/06/16 1725 02/07/16 0357   02/06/16 1433  bacitracin 50,000 Units in sodium chloride irrigation 0.9 % 500 mL irrigation  Status:  Discontinued       As needed 02/06/16 1433 02/06/16 1500   02/06/16 1330  vancomycin (VANCOCIN) 1,500 mg in sodium chloride 0.9 % 500 mL IVPB     1,500 mg 250 mL/hr over 120 Minutes Intravenous To [facility] 02/05/16 1353 02/06/16 1355      Discharge Exam: Blood pressure (!) 128/43, pulse 83, temperature 99.2 F (37.3 C), temperature source Oral, resp. rate 20, weight 103 kg (227 lb), SpO2 93 %. Neurologic: Grossly normal Dressing dry  Discharge Medications:     Medication List    STOP taking these medications   methylPREDNISolone 4 MG Tbpk tablet Commonly known  as:  MEDROL DOSEPAK     TAKE these medications   amLODipine 10 MG tablet Commonly known as:  NORVASC Take 1 tablet (10 mg total) by mouth at bedtime.   aspirin 81 MG tablet Take 81 mg by mouth daily.   clopidogrel 75 MG tablet Commonly known as:  PLAVIX Take 1 tablet (75 mg total) by mouth daily with breakfast.   glimepiride 2 MG tablet Commonly known as:  AMARYL Take 2 mg by mouth daily with breakfast.   glimepiride 2 MG  tablet Commonly known as:  AMARYL Take 2 mg by mouth daily.   isosorbide mononitrate 60 MG 24 hr tablet Commonly known as:  IMDUR Take 1 tablet (60 mg total) by mouth at bedtime.   metFORMIN 500 MG tablet Commonly known as:  GLUCOPHAGE Take 2 tablets (1,000 mg total) by mouth 2 (two) times daily with a meal.   metoprolol succinate 100 MG 24 hr tablet Commonly known as:  TOPROL-XL Take 50-100 mg by mouth 2 (two) times daily. TAKE 1 TABLET BY MOUTH IN THE AM & 1/2 TABLET BY MOUTH IN THE PM.  100 mg in the morning and 50 mg every evening   nitroGLYCERIN 0.4 MG SL tablet Commonly known as:  NITROSTAT Place 1 tablet (0.4 mg total) under the tongue every 5 (five) minutes as needed for chest pain.   NON FORMULARY Take 1 tablet by mouth daily. Take daily () Vitamin D3 2000 mg, Calcium 333 mg, magnesium 133 mg, zinc 5mg .)   oxyCODONE-acetaminophen 10-325 MG tablet Commonly known as:  PERCOCET Take 1 tablet by mouth every 4 (four) hours as needed for pain.       Disposition: home   Final Dx: LL L1-2  Discharge Instructions     Remove dressing in 72 hours    Complete by:  As directed   Call MD for:  difficulty breathing, headache or visual disturbances    Complete by:  As directed   Call MD for:  persistant nausea and vomiting    Complete by:  As directed   Call MD for:  redness, tenderness, or signs of infection (pain, swelling, redness, odor or green/yellow discharge around incision site)    Complete by:  As directed   Call MD for:  severe uncontrolled pain    Complete by:  As directed   Call MD for:  temperature >100.4    Complete by:  As directed   Diet - low sodium heart healthy    Complete by:  As directed   Discharge instructions    Complete by:  As directed   No heavy lifting, no strenuous activity, may shower   Increase activity slowly    Complete by:  As directed         Signed: Lyam Provencio S 02/07/2016, 9:56 AM

## 2016-02-07 NOTE — Progress Notes (Signed)
Pt and son given D/C instructions with Rx's, verbal understanding was provided. Pt's incision is clean and dry with no sign of infection. Pt's IV was removed prior to D/C. Pt D/C'd home via wheelchair @ 1200 per MD order. Pt is stable @ D/C and has no other needs at this time. Holli Humbles, RN

## 2016-02-07 NOTE — Discharge Instructions (Signed)

## 2016-02-13 NOTE — Anesthesia Postprocedure Evaluation (Signed)
Anesthesia Post Note  Patient: Carlos Herman  Procedure(s) Performed: Procedure(s) (LRB): Laminectomy and Foraminotomy - Lumbar one-Lumbar two (N/A)  Patient location during evaluation: PACU Anesthesia Type: General Level of consciousness: awake and alert Pain management: pain level controlled Vital Signs Assessment: post-procedure vital signs reviewed and stable Respiratory status: spontaneous breathing, nonlabored ventilation, respiratory function stable and patient connected to nasal cannula oxygen Cardiovascular status: blood pressure returned to baseline and stable Postop Assessment: no signs of nausea or vomiting Anesthetic complications: no    Last Vitals:  Vitals:   02/07/16 0400 02/07/16 0753  BP: (!) 142/65 (!) 128/43  Pulse: 84 83  Resp: 20 20  Temp: 36.9 C 37.3 C    Last Pain:  Vitals:   02/07/16 0915  TempSrc:   PainSc: 4                  Moriyah Byington Minda Meo

## 2016-02-21 DIAGNOSIS — M545 Low back pain: Secondary | ICD-10-CM | POA: Diagnosis not present

## 2016-02-21 DIAGNOSIS — M4806 Spinal stenosis, lumbar region: Secondary | ICD-10-CM | POA: Diagnosis not present

## 2016-02-25 DIAGNOSIS — M4806 Spinal stenosis, lumbar region: Secondary | ICD-10-CM | POA: Diagnosis not present

## 2016-02-25 DIAGNOSIS — M545 Low back pain: Secondary | ICD-10-CM | POA: Diagnosis not present

## 2016-02-28 DIAGNOSIS — M4806 Spinal stenosis, lumbar region: Secondary | ICD-10-CM | POA: Diagnosis not present

## 2016-02-28 DIAGNOSIS — M545 Low back pain: Secondary | ICD-10-CM | POA: Diagnosis not present

## 2016-03-04 ENCOUNTER — Other Ambulatory Visit: Payer: Self-pay | Admitting: Cardiovascular Disease

## 2016-03-04 DIAGNOSIS — M4806 Spinal stenosis, lumbar region: Secondary | ICD-10-CM | POA: Diagnosis not present

## 2016-03-04 DIAGNOSIS — M545 Low back pain: Secondary | ICD-10-CM | POA: Diagnosis not present

## 2016-03-06 DIAGNOSIS — M4806 Spinal stenosis, lumbar region: Secondary | ICD-10-CM | POA: Diagnosis not present

## 2016-03-06 DIAGNOSIS — M545 Low back pain: Secondary | ICD-10-CM | POA: Diagnosis not present

## 2016-03-10 DIAGNOSIS — M4806 Spinal stenosis, lumbar region: Secondary | ICD-10-CM | POA: Diagnosis not present

## 2016-03-10 DIAGNOSIS — M545 Low back pain: Secondary | ICD-10-CM | POA: Diagnosis not present

## 2016-03-12 DIAGNOSIS — M4806 Spinal stenosis, lumbar region: Secondary | ICD-10-CM | POA: Diagnosis not present

## 2016-03-12 DIAGNOSIS — M545 Low back pain: Secondary | ICD-10-CM | POA: Diagnosis not present

## 2016-03-19 DIAGNOSIS — M545 Low back pain: Secondary | ICD-10-CM | POA: Diagnosis not present

## 2016-03-19 DIAGNOSIS — M4806 Spinal stenosis, lumbar region: Secondary | ICD-10-CM | POA: Diagnosis not present

## 2016-03-21 DIAGNOSIS — M4806 Spinal stenosis, lumbar region: Secondary | ICD-10-CM | POA: Diagnosis not present

## 2016-03-21 DIAGNOSIS — M545 Low back pain: Secondary | ICD-10-CM | POA: Diagnosis not present

## 2016-03-23 ENCOUNTER — Encounter (HOSPITAL_COMMUNITY): Payer: Self-pay | Admitting: *Deleted

## 2016-03-23 ENCOUNTER — Inpatient Hospital Stay (HOSPITAL_COMMUNITY)
Admission: EM | Admit: 2016-03-23 | Discharge: 2016-03-26 | DRG: 981 | Disposition: A | Discharge status: Home / Self Care | Payer: Medicare Other | Attending: Internal Medicine | Admitting: Internal Medicine

## 2016-03-23 ENCOUNTER — Emergency Department (HOSPITAL_COMMUNITY): Payer: Medicare Other

## 2016-03-23 DIAGNOSIS — I25110 Atherosclerotic heart disease of native coronary artery with unstable angina pectoris: Secondary | ICD-10-CM | POA: Diagnosis present

## 2016-03-23 DIAGNOSIS — Z951 Presence of aortocoronary bypass graft: Secondary | ICD-10-CM

## 2016-03-23 DIAGNOSIS — Z7982 Long term (current) use of aspirin: Secondary | ICD-10-CM

## 2016-03-23 DIAGNOSIS — I214 Non-ST elevation (NSTEMI) myocardial infarction: Secondary | ICD-10-CM

## 2016-03-23 DIAGNOSIS — I252 Old myocardial infarction: Secondary | ICD-10-CM

## 2016-03-23 DIAGNOSIS — K869 Disease of pancreas, unspecified: Secondary | ICD-10-CM | POA: Diagnosis present

## 2016-03-23 DIAGNOSIS — E119 Type 2 diabetes mellitus without complications: Secondary | ICD-10-CM | POA: Diagnosis not present

## 2016-03-23 DIAGNOSIS — Z833 Family history of diabetes mellitus: Secondary | ICD-10-CM

## 2016-03-23 DIAGNOSIS — Z96641 Presence of right artificial hip joint: Secondary | ICD-10-CM | POA: Diagnosis present

## 2016-03-23 DIAGNOSIS — R933 Abnormal findings on diagnostic imaging of other parts of digestive tract: Secondary | ICD-10-CM | POA: Diagnosis present

## 2016-03-23 DIAGNOSIS — Z96651 Presence of right artificial knee joint: Secondary | ICD-10-CM | POA: Diagnosis present

## 2016-03-23 DIAGNOSIS — K5660 Unspecified intestinal obstruction: Principal | ICD-10-CM | POA: Diagnosis present

## 2016-03-23 DIAGNOSIS — Z823 Family history of stroke: Secondary | ICD-10-CM

## 2016-03-23 DIAGNOSIS — K8020 Calculus of gallbladder without cholecystitis without obstruction: Secondary | ICD-10-CM | POA: Diagnosis not present

## 2016-03-23 DIAGNOSIS — Z8673 Personal history of transient ischemic attack (TIA), and cerebral infarction without residual deficits: Secondary | ICD-10-CM | POA: Diagnosis not present

## 2016-03-23 DIAGNOSIS — F329 Major depressive disorder, single episode, unspecified: Secondary | ICD-10-CM | POA: Diagnosis present

## 2016-03-23 DIAGNOSIS — Z87442 Personal history of urinary calculi: Secondary | ICD-10-CM

## 2016-03-23 DIAGNOSIS — I10 Essential (primary) hypertension: Secondary | ICD-10-CM | POA: Diagnosis present

## 2016-03-23 DIAGNOSIS — K5669 Other intestinal obstruction: Secondary | ICD-10-CM | POA: Diagnosis not present

## 2016-03-23 DIAGNOSIS — K56609 Unspecified intestinal obstruction, unspecified as to partial versus complete obstruction: Secondary | ICD-10-CM | POA: Diagnosis present

## 2016-03-23 DIAGNOSIS — R140 Abdominal distension (gaseous): Secondary | ICD-10-CM | POA: Diagnosis not present

## 2016-03-23 DIAGNOSIS — I2510 Atherosclerotic heart disease of native coronary artery without angina pectoris: Secondary | ICD-10-CM | POA: Diagnosis present

## 2016-03-23 DIAGNOSIS — Z86711 Personal history of pulmonary embolism: Secondary | ICD-10-CM

## 2016-03-23 DIAGNOSIS — E785 Hyperlipidemia, unspecified: Secondary | ICD-10-CM | POA: Diagnosis present

## 2016-03-23 DIAGNOSIS — Z7984 Long term (current) use of oral hypoglycemic drugs: Secondary | ICD-10-CM

## 2016-03-23 DIAGNOSIS — Z79891 Long term (current) use of opiate analgesic: Secondary | ICD-10-CM

## 2016-03-23 DIAGNOSIS — Z7902 Long term (current) use of antithrombotics/antiplatelets: Secondary | ICD-10-CM

## 2016-03-23 DIAGNOSIS — G8929 Other chronic pain: Secondary | ICD-10-CM | POA: Diagnosis present

## 2016-03-23 DIAGNOSIS — M1990 Unspecified osteoarthritis, unspecified site: Secondary | ICD-10-CM | POA: Diagnosis present

## 2016-03-23 DIAGNOSIS — Z955 Presence of coronary angioplasty implant and graft: Secondary | ICD-10-CM

## 2016-03-23 LAB — COMPREHENSIVE METABOLIC PANEL
ALT: 14 U/L — AB (ref 17–63)
AST: 20 U/L (ref 15–41)
Albumin: 4.1 g/dL (ref 3.5–5.0)
Alkaline Phosphatase: 78 U/L (ref 38–126)
Anion gap: 13 (ref 5–15)
BUN: 12 mg/dL (ref 6–20)
CALCIUM: 9.4 mg/dL (ref 8.9–10.3)
CHLORIDE: 100 mmol/L — AB (ref 101–111)
CO2: 21 mmol/L — ABNORMAL LOW (ref 22–32)
CREATININE: 1.02 mg/dL (ref 0.61–1.24)
Glucose, Bld: 190 mg/dL — ABNORMAL HIGH (ref 65–99)
Potassium: 4.5 mmol/L (ref 3.5–5.1)
Sodium: 134 mmol/L — ABNORMAL LOW (ref 135–145)
Total Bilirubin: 0.8 mg/dL (ref 0.3–1.2)
Total Protein: 7.1 g/dL (ref 6.5–8.1)

## 2016-03-23 LAB — I-STAT TROPONIN, ED: TROPONIN I, POC: 0 ng/mL (ref 0.00–0.08)

## 2016-03-23 LAB — CBC WITH DIFFERENTIAL/PLATELET
BASOS ABS: 0 10*3/uL (ref 0.0–0.1)
BASOS PCT: 0 %
EOS ABS: 0.1 10*3/uL (ref 0.0–0.7)
Eosinophils Relative: 0 %
HCT: 44.8 % (ref 39.0–52.0)
HEMOGLOBIN: 14.9 g/dL (ref 13.0–17.0)
Lymphocytes Relative: 12 %
Lymphs Abs: 1.8 10*3/uL (ref 0.7–4.0)
MCH: 31.8 pg (ref 26.0–34.0)
MCHC: 33.3 g/dL (ref 30.0–36.0)
MCV: 95.5 fL (ref 78.0–100.0)
Monocytes Absolute: 0.8 10*3/uL (ref 0.1–1.0)
Monocytes Relative: 6 %
NEUTROS ABS: 11.8 10*3/uL — AB (ref 1.7–7.7)
NEUTROS PCT: 82 %
PLATELETS: 358 10*3/uL (ref 150–400)
RBC: 4.69 MIL/uL (ref 4.22–5.81)
RDW: 12.9 % (ref 11.5–15.5)
WBC: 14.5 10*3/uL — ABNORMAL HIGH (ref 4.0–10.5)

## 2016-03-23 MED ORDER — SODIUM CHLORIDE 0.9 % IV SOLN
Freq: Once | INTRAVENOUS | Status: AC
  Administered 2016-03-23: 22:00:00 via INTRAVENOUS

## 2016-03-23 MED ORDER — ONDANSETRON HCL 4 MG/2ML IJ SOLN
4.0000 mg | Freq: Once | INTRAMUSCULAR | Status: AC
  Administered 2016-03-23: 4 mg via INTRAVENOUS
  Filled 2016-03-23: qty 2

## 2016-03-23 NOTE — ED Notes (Signed)
EDP at bedside  

## 2016-03-23 NOTE — ED Triage Notes (Signed)
Patient presents stating he got up this morning and was nauseated.  Was able to eat crackers and ham this afternoon but started with nausea and vomited 1 time since.  No diarrhea

## 2016-03-23 NOTE — ED Notes (Signed)
Requested urine sample from pt ? ?

## 2016-03-23 NOTE — ED Provider Notes (Signed)
[place] DEPT Provider Note   CSN: [id number] Arrival date & time: 03/23/16  2116     History   Chief Complaint Chief Complaint  Patient presents with  . Nausea    HPI Carlos Herman is a 75 y.o. male.  This is an obese male presents to the emergency room with 14 hours of nausea.  States he woke this morning not feeling well.  He didn't feel good enough to go out for breakfast, which is his normal routine.  He did snack on crackers and ham at lunchtime butter and 7:00.  He did have an episode of vomiting.  Has had persistent intermittent nausea since then.  He does take chronic pain medication and tends to be constipated.  He states he had a small bowel movement yesterday but none today.  Neisen any recent illness, fever, myalgias, URI.  Cough, shortness of breath.  He states he did have an episode of chest pain on Wednesday and took one nitroglycerin with total resolution of his pain. He has a history of chronic pain, CAD, depression, diabetes type 2, hyperlipidemia, kidney stones, hiatal hernia, MI, pulmonary embolus, CVA.  He states he did not take any medication at home for nausea      Past Medical History:  Diagnosis Date  . CAD (coronary artery disease)    a. s/p CABG; b. s/p diffuse stenting to RCA; c. 3/4 grafts known to be occluded;  d.  LHC 12/15:  pLAD 100, small CFx diff disz - no amenable to PCI, RCA severe mid and dist ISR, S-OM, S-RCA 100, L-LAD atretic, S-Dx ok (dist seg of Dx 100) >> PCI:  DES to mRCA and POBA/cutting balloon to dist RCA   . Depression   . DM (diabetes mellitus) ([place])    Type 2  . Dyslipidemia   . Hemorrhoids    internal  . History of Doppler ultrasound    Carotid US (2/16):  Normal carotid arteries bilaterally.  Marland Kitchen History of hiatal hernia   . History of kidney stones   . History of pneumonia   . History of seizures    "when I was hospitalized for stroke"  . Hx of echocardiogram    a. Echo 10/15:  mild LVH, EF 55-60%, no RWMA, Ao  root 39 mm, mild MR, mild LAE  . Hypertension   . Leg edema    right  . Myocardial infarction ([place])   . Nephrolithiasis   . Nocturia   . Obesity    morbid  . Osteoarthritis   . Pulmonary embolism ([place])    dx 03/2014 >> On Apixaban  . Stroke ([place]) \   no deficits  . Wears glasses     Patient Active Problem List   Diagnosis Date Noted  . Pancreatic lesion 03/24/2016  . SBO (small bowel obstruction) ([place]) 03/24/2016  . Small bowel obstruction ([place]) 03/24/2016  . S/P lumbar laminectomy 02/06/2016  . Chronic anticoagulation 06/18/2014  . Back pain 04/26/2014  . Pulmonary embolus- Oct 2015 04/10/2014  . Pain in joint, lower leg 02/07/2014  . HIP REPLACEMENT, RIGHT, HX OF 09/07/2009  . FRACTURE, ANKLE, RIGHT 08/17/2008  . S/P CABG 1996 08/16/2008  . Type 2 diabetes mellitus ([place]) 09/28/2007  . HLD (hyperlipidemia) 09/28/2007  . MORBID OBESITY 09/28/2007  . DEPRESSION 09/28/2007  . Essential hypertension 09/28/2007  . Coronary Artery Disease 09/28/2007  . STROKE 09/28/2007  . NEPHROLITHIASIS 09/28/2007  . OSTEOARTHRITIS, KNEE 09/28/2007  . LEG EDEMA, RIGHT 09/28/2007  . HEMORRHOIDS,  INTERNAL 03/11/2007    Past Surgical History:  Procedure Laterality Date  . ANGIOPLASTY / STENTING ILIAC    . BACK SURGERY     x2  . COLONOSCOPY    . Heart Bypass  1996  . LEFT HEART CATHETERIZATION WITH CORONARY/GRAFT ANGIOGRAM N/A 06/16/2014   Procedure: LEFT HEART CATHETERIZATION WITH Beatrix Fetters;  Surgeon: Burnell Blanks, MD;  Location: [facility] CATH LAB;  Service: Cardiovascular;  Laterality: N/A;  . LUMBAR LAMINECTOMY/DECOMPRESSION MICRODISCECTOMY N/A 02/06/2016   Procedure: Laminectomy and Foraminotomy - Lumbar one-Lumbar two;  Surgeon: Eustace Moore, MD;  Location: [place] NEURO ORS;  Service: Neurosurgery;  Laterality: N/A;  . PERCUTANEOUS CORONARY STENT INTERVENTION (PCI-S)  06/16/2014   Procedure: PERCUTANEOUS CORONARY STENT INTERVENTION (PCI-S);  Surgeon: Burnell Blanks, MD;  Location: [facility] CATH LAB;  Service: Cardiovascular;;  MID RCA, Cutting Balloon and Stent Distal RCA, Cutting Balloon   . TOTAL KNEE ARTHROPLASTY     right  . UMBILICAL HERNIA REPAIR         Home Medications    Prior to Admission medications   Medication Sig Start Date End Date Taking? Authorizing Provider  amLODipine (NORVASC) 10 MG tablet Take 1 tablet (10 mg total) by mouth at bedtime. 02/02/15  Yes Josue Hector, MD  aspirin EC 81 MG tablet Take 81 mg by mouth daily.   Yes Historical Provider, MD  clopidogrel (PLAVIX) 75 MG tablet Take 1 tablet (75 mg total) by mouth daily with breakfast. 09/21/15  Yes Josue Hector, MD  isosorbide mononitrate (IMDUR) 60 MG 24 hr tablet Take 1 tablet (60 mg total) by mouth at bedtime. 04/13/15  Yes Josue Hector, MD  metFORMIN (GLUCOPHAGE) 500 MG tablet Take 2 tablets (1,000 mg total) by mouth 2 (two) times daily with a meal. 06/18/14  Yes Erlene Quan, PA-C  metoprolol succinate (TOPROL-XL) 100 MG 24 hr tablet take 1 tablet by mouth every morning and 1/2 tablet every evening (TAKE WITH OR IMMEDIATELY AFTER MEALS) 03/05/16  Yes Josue Hector, MD  Multiple Minerals-Vitamins (CAL MAG ZINC +D3 PO) Take 1 tablet by mouth 2 (two) times daily. Per 3 tablets; Calcium 1000 mg, magnesium 400 mg, zinc 25 mg, vitamin D3 600 I.U.   Yes Historical Provider, MD  nitroGLYCERIN (NITROSTAT) 0.4 MG SL tablet Place 1 tablet (0.4 mg total) under the tongue every 5 (five) minutes as needed for chest pain. 07/06/14  Yes Josue Hector, MD  oxyCODONE-acetaminophen (PERCOCET) 7.5-325 MG tablet Take 1 tablet by mouth 2 (two) times daily.  01/30/16  Yes Historical Provider, MD    Family History Family History  Problem Relation Age of Onset  . Stroke Mother   . Diabetes Mother   . Heart attack Neg Hx     Social History Social History  Substance Use Topics  . Smoking status: Never Smoker  . Smokeless tobacco: Never Used  . Alcohol use Yes     Comment: Rarely       Allergies   Penicillins and Statins   Review of Systems Review of Systems  Constitutional: Negative for chills and fever.  Respiratory: Negative for shortness of breath.   Cardiovascular: Negative for chest pain and leg swelling.  Gastrointestinal: Positive for abdominal pain, nausea and vomiting. Negative for constipation and diarrhea.  Genitourinary: Negative for dysuria.  Musculoskeletal: Positive for back pain.  All other systems reviewed and are negative.    Physical Exam Updated Vital Signs BP (!) 134/51   Pulse 75  Temp 98.1 F (36.7 C) (Oral)   Resp 19   SpO2 96%   Physical Exam  Constitutional: He is oriented to person, place, and time. He appears well-developed and well-nourished.  HENT:  Head: Normocephalic and atraumatic.  Eyes: Pupils are equal, round, and reactive to light.  Neck: Normal range of motion.  Cardiovascular: Normal rate.   Pulmonary/Chest: Effort normal.  Abdominal: He exhibits no distension. There is no tenderness.  Musculoskeletal: Normal range of motion.  Neurological: He is alert and oriented to person, place, and time.  Skin: Skin is warm and dry. There is pallor.  Nursing note and vitals reviewed.    ED Treatments / Results  Labs (all labs ordered are listed, but only abnormal results are displayed) Labs Reviewed  CBC WITH DIFFERENTIAL/PLATELET - Abnormal; Notable for the following:       Result Value   WBC 14.5 (*)    Neutro Abs 11.8 (*)    All other components within normal limits  COMPREHENSIVE METABOLIC PANEL - Abnormal; Notable for the following:    Sodium 134 (*)    Chloride 100 (*)    CO2 21 (*)    Glucose, Bld 190 (*)    ALT 14 (*)    All other components within normal limits  URINALYSIS, ROUTINE W REFLEX MICROSCOPIC (NOT AT [facility]) - Abnormal; Notable for the following:    Ketones, ur 15 (*)    All other components within normal limits  I-STAT TROPOININ, ED    EKG  EKG Interpretation  Date/Time:  Sunday  March 23 2016 21:33:44 EDT Ventricular Rate:  73 PR Interval:  164 QRS Duration: 76 QT Interval:  410 QTC Calculation: 451 R Axis:   32 Text Interpretation:  Sinus rhythm with Premature atrial complexes Cannot rule out Anterior infarct , age undetermined Abnormal ECG When compared with ECG of 12/21/2015, No significant change was found Confirmed by [facility]  MD, DAVID ([id number]) on 03/24/2016 1:51:27 AM       Radiology Ct Abdomen Pelvis W Contrast  Result Date: 03/24/2016 CLINICAL DATA:  75 year old male with abdominal pain. EXAM: CT ABDOMEN AND PELVIS WITH CONTRAST TECHNIQUE: Multidetector CT imaging of the abdomen and pelvis was performed using the standard protocol following bolus administration of intravenous contrast. CONTRAST:  163mL ISOVUE-300 IOPAMIDOL (ISOVUE-300) INJECTION 61% COMPARISON:  Abdominal radiograph dated 03/23/2016 and CT dated 04/27/2014 FINDINGS: Lower chest: Right lung base subsegmental atelectasis versus pneumonia. There are bilateral lower lobe pulmonary artery emboli which appear linear in morphology and similar to the PE seen on the chest CT dated 04/10/2014. There is coronary vascular calcification and CABG surgery. No intra-abdominal free air or free fluid. Hepatobiliary: Small stones within the gallbladder fundus. No pericholecystic fluid. Probable mild fatty infiltration of the liver. No biliary ductal dilatation. Pancreas: There multiple hypodense lesions in the pancreas measuring up to 1.5 cm in the proximal body of the pancreas. These lesions are grossly similar to the lesion seen on the prior CT of 2015 further evaluation with MRI without and with contrast, if not performed, recommended. There is no dilatation of the main pancreatic duct. No active pancreatic inflammatory changes. Spleen: Normal in size without focal abnormality. Adrenals/Urinary Tract: The adrenal glands appear unremarkable. There is mild bilateral renal atrophy. Multiple right renal calculi noted.  There is a 1 cm stone in the right renal pelvis with mild right hydronephrosis. Multiple smaller right renal inferior pole calculi noted. There is no hydronephrosis or nephrolithiasis on the left. The 1.5 cm left  renal inferior pole hypodense lesion appears similar to prior study but incompletely characterized on this CT. This most likely represents a cyst. Further evaluation with nonemergent ultrasound recommended. The visualized ureters and urinary bladder appear unremarkable. Stomach/Bowel: There is sigmoid diverticulosis and scattered colonic diverticula without active inflammatory changes. Multiple mildly dilated loops of small bowel in the mid to lower abdomen noted. There is segmental thickening of the small bowel in the lower abdomen and right hemipelvis likely representing enteritis with associated ileus or low-grade/ with transition zone in the right hemipelvis. The distal small bowel and terminal ileum are decompressed. Normal appendix. Vascular/Lymphatic: There is moderate aortoiliac atherosclerotic disease. There is heavy atherosclerotic calcification of the origins of the celiac axis, SMA, and IMA. The origins of the these vessels however remain patent. No portal venous gas identified. There is no adenopathy. Reproductive: The prostate and seminal vesicles are grossly unremarkable. 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 Multilevel degenerative changes of the spine with osteophyte formation. L4-L5 spinous process fixation hardware. No acute fracture. Postsurgical changes of the lower lumbar spine. There is laminectomy at L2. A 3.5 x 1.7 cm fluid collection in the posterior paraspinal soft tissues in the midline at this level noted. IMPRESSION: Findings likely represent enteritis with segmental ileus versus an early small-bowel obstruction with transition zone in the right hemipelvis. Clinical correlation and follow-up  recommended. Cholelithiasis. Right renal calculi with mild right hydronephrosis. Stable appearing hypodense pancreatic lesions. Further evaluation with nonemergent MRI without and with contrast recommended. Electronically Signed   By: Anner Crete M.D.   On: 03/24/2016 01:38   Dg Abd Acute W/chest  Result Date: 03/23/2016 CLINICAL DATA:  Acute onset of nausea and vomiting. Initial encounter. EXAM: DG ABDOMEN ACUTE W/ 1V CHEST COMPARISON:  Chest radiograph performed 01/29/2016, and CT of the abdomen and pelvis from 04/27/2014 FINDINGS: The lungs are well-aerated. There is mild elevation of the right hemidiaphragm. There is no evidence of focal opacification, pleural effusion or pneumothorax. The cardiomediastinal silhouette is mildly enlarged. The patient is status post median sternotomy, with evidence of prior CABG. There is distention of small-bowel loops to 4.7 cm in maximal diameter, with suggestion of underlying diffuse small bowel wall thickening. This may reflect underlying infectious or inflammatory ileitis. Air is still seen within the colon, without definite evidence for bowel obstruction. Would correlate with the patient's symptoms. No acute osseous abnormalities are seen; the sacroiliac joints are unremarkable in appearance. The patient's right hip arthroplasty is grossly unremarkable in appearance, though incompletely imaged. Lumbar spinal fusion hardware is also seen. IMPRESSION: 1. Distention of small-bowel loops to 4.7 cm in maximal diameter, with suggestion of underlying diffuse bowel wall thickening. This may reflect underlying infectious or inflammatory ileitis. The appearance is less typical for bowel obstruction. Would correlate with the patient's symptoms. 2. Mild elevation of the right hemidiaphragm. Mild cardiomegaly. Lungs remain grossly clear. Electronically Signed   By: Garald Balding M.D.   On: 03/23/2016 22:58    Procedures Procedures (including critical care  time)  Medications Ordered in ED Medications  0.9 %  sodium chloride infusion (not administered)  amLODipine (NORVASC) tablet 10 mg (not administered)  isosorbide mononitrate (IMDUR) 24 hr tablet 60 mg (not administered)  clopidogrel (PLAVIX) tablet 75 mg (not administered)  oxyCODONE-acetaminophen (PERCOCET) 7.5-325 MG per tablet 1 tablet (not administered)  aspirin EC tablet 81 mg (not administered)  metoprolol succinate (TOPROL-XL) 24 hr tablet 50-100 mg (not administered)  0.9 %  sodium chloride infusion ( Intravenous Stopped 03/24/16 0158)  ondansetron (ZOFRAN) injection 4 mg (4 mg Intravenous Given 03/23/16 2223)  iopamidol (ISOVUE-300) 61 % injection (100 mLs  Contrast Given 03/24/16 0031)  ondansetron (ZOFRAN) injection 4 mg (4 mg Intravenous Given 03/24/16 0122)  0.9 %  sodium chloride infusion ( Intravenous New Bag/Given 03/24/16 0204)     Initial Impression / Assessment and Plan / ED Course  I have reviewed the triage vital signs and the nursing notes.  Pertinent labs & imaging results that were available during my care of the patient were reviewed by me and considered in my medical decision making (see chart for details).  Clinical Course     Patient has small bowel obstruction.  He's been kept nothing by mouth additional IV fluids have been ordered.  I spoke with Dr. Alcario Drought who will admit the patient  Final Clinical Impressions(s) / ED Diagnoses   Final diagnoses:  Small bowel obstruction [facility])    New Prescriptions New Prescriptions   No medications on file     Junius Creamer, NP 03/24/16 0206

## 2016-03-23 NOTE — ED Provider Notes (Signed)
Medical screening examination/treatment/procedure(s) were conducted as a shared visit with non-physician practitioner(s) and myself.  I personally evaluated the patient during the encounter.   EKG Interpretation None     Patient here with 1 day of nausea without vomiting as well as abdominal distention. On exam he does not have any peritoneal signs. Acute abdominal series shows possible obstruction. Abdominal CT results pending at this time   Lacretia Leigh, MD 03/23/16 2351

## 2016-03-23 NOTE — ED Notes (Signed)
MD at bedside. 

## 2016-03-24 ENCOUNTER — Encounter (HOSPITAL_COMMUNITY): Payer: Self-pay | Admitting: Radiology

## 2016-03-24 ENCOUNTER — Emergency Department (HOSPITAL_COMMUNITY): Payer: Medicare Other

## 2016-03-24 DIAGNOSIS — Z7984 Long term (current) use of oral hypoglycemic drugs: Secondary | ICD-10-CM | POA: Diagnosis not present

## 2016-03-24 DIAGNOSIS — Z7902 Long term (current) use of antithrombotics/antiplatelets: Secondary | ICD-10-CM | POA: Diagnosis not present

## 2016-03-24 DIAGNOSIS — Z79891 Long term (current) use of opiate analgesic: Secondary | ICD-10-CM | POA: Diagnosis not present

## 2016-03-24 DIAGNOSIS — K5669 Other intestinal obstruction: Secondary | ICD-10-CM | POA: Diagnosis not present

## 2016-03-24 DIAGNOSIS — Z87442 Personal history of urinary calculi: Secondary | ICD-10-CM | POA: Diagnosis not present

## 2016-03-24 DIAGNOSIS — Z823 Family history of stroke: Secondary | ICD-10-CM | POA: Diagnosis not present

## 2016-03-24 DIAGNOSIS — I249 Acute ischemic heart disease, unspecified: Secondary | ICD-10-CM | POA: Insufficient documentation

## 2016-03-24 DIAGNOSIS — R7989 Other specified abnormal findings of blood chemistry: Secondary | ICD-10-CM | POA: Diagnosis not present

## 2016-03-24 DIAGNOSIS — E119 Type 2 diabetes mellitus without complications: Secondary | ICD-10-CM | POA: Diagnosis present

## 2016-03-24 DIAGNOSIS — M1990 Unspecified osteoarthritis, unspecified site: Secondary | ICD-10-CM | POA: Diagnosis present

## 2016-03-24 DIAGNOSIS — I25810 Atherosclerosis of coronary artery bypass graft(s) without angina pectoris: Secondary | ICD-10-CM | POA: Diagnosis not present

## 2016-03-24 DIAGNOSIS — E785 Hyperlipidemia, unspecified: Secondary | ICD-10-CM | POA: Diagnosis present

## 2016-03-24 DIAGNOSIS — Z951 Presence of aortocoronary bypass graft: Secondary | ICD-10-CM | POA: Diagnosis not present

## 2016-03-24 DIAGNOSIS — I2510 Atherosclerotic heart disease of native coronary artery without angina pectoris: Secondary | ICD-10-CM | POA: Diagnosis not present

## 2016-03-24 DIAGNOSIS — Z86711 Personal history of pulmonary embolism: Secondary | ICD-10-CM | POA: Diagnosis not present

## 2016-03-24 DIAGNOSIS — Z8673 Personal history of transient ischemic attack (TIA), and cerebral infarction without residual deficits: Secondary | ICD-10-CM | POA: Diagnosis not present

## 2016-03-24 DIAGNOSIS — K56609 Unspecified intestinal obstruction, unspecified as to partial versus complete obstruction: Secondary | ICD-10-CM | POA: Diagnosis present

## 2016-03-24 DIAGNOSIS — I25118 Atherosclerotic heart disease of native coronary artery with other forms of angina pectoris: Secondary | ICD-10-CM | POA: Diagnosis not present

## 2016-03-24 DIAGNOSIS — K5660 Unspecified intestinal obstruction: Secondary | ICD-10-CM | POA: Diagnosis present

## 2016-03-24 DIAGNOSIS — K8020 Calculus of gallbladder without cholecystitis without obstruction: Secondary | ICD-10-CM | POA: Diagnosis not present

## 2016-03-24 DIAGNOSIS — Z7982 Long term (current) use of aspirin: Secondary | ICD-10-CM | POA: Diagnosis not present

## 2016-03-24 DIAGNOSIS — Z833 Family history of diabetes mellitus: Secondary | ICD-10-CM | POA: Diagnosis not present

## 2016-03-24 DIAGNOSIS — Z96641 Presence of right artificial hip joint: Secondary | ICD-10-CM | POA: Diagnosis present

## 2016-03-24 DIAGNOSIS — R079 Chest pain, unspecified: Secondary | ICD-10-CM | POA: Insufficient documentation

## 2016-03-24 DIAGNOSIS — I214 Non-ST elevation (NSTEMI) myocardial infarction: Secondary | ICD-10-CM | POA: Diagnosis not present

## 2016-03-24 DIAGNOSIS — G8929 Other chronic pain: Secondary | ICD-10-CM | POA: Diagnosis present

## 2016-03-24 DIAGNOSIS — I10 Essential (primary) hypertension: Secondary | ICD-10-CM | POA: Diagnosis not present

## 2016-03-24 DIAGNOSIS — Z96651 Presence of right artificial knee joint: Secondary | ICD-10-CM | POA: Diagnosis present

## 2016-03-24 DIAGNOSIS — I252 Old myocardial infarction: Secondary | ICD-10-CM | POA: Diagnosis not present

## 2016-03-24 DIAGNOSIS — F329 Major depressive disorder, single episode, unspecified: Secondary | ICD-10-CM | POA: Diagnosis present

## 2016-03-24 DIAGNOSIS — I25110 Atherosclerotic heart disease of native coronary artery with unstable angina pectoris: Secondary | ICD-10-CM | POA: Diagnosis not present

## 2016-03-24 DIAGNOSIS — K869 Disease of pancreas, unspecified: Secondary | ICD-10-CM | POA: Diagnosis present

## 2016-03-24 LAB — URINALYSIS, ROUTINE W REFLEX MICROSCOPIC
BILIRUBIN URINE: NEGATIVE
Glucose, UA: NEGATIVE mg/dL
Hgb urine dipstick: NEGATIVE
Ketones, ur: 15 mg/dL — AB
LEUKOCYTES UA: NEGATIVE
NITRITE: NEGATIVE
PH: 5.5 (ref 5.0–8.0)
Protein, ur: NEGATIVE mg/dL
SPECIFIC GRAVITY, URINE: 1.02 (ref 1.005–1.030)

## 2016-03-24 LAB — GLUCOSE, CAPILLARY
GLUCOSE-CAPILLARY: 137 mg/dL — AB (ref 65–99)
Glucose-Capillary: 123 mg/dL — ABNORMAL HIGH (ref 65–99)

## 2016-03-24 LAB — TROPONIN I
TROPONIN I: 0.17 ng/mL — AB (ref <0.03)
TROPONIN I: 0.35 ng/mL — AB (ref <0.03)
Troponin I: <0.03 ng/mL (ref <0.03)

## 2016-03-24 MED ORDER — HYDRALAZINE HCL 20 MG/ML IJ SOLN
5.0000 mg | Freq: Once | INTRAMUSCULAR | Status: AC
  Administered 2016-03-24: 5 mg via INTRAVENOUS
  Filled 2016-03-24: qty 1

## 2016-03-24 MED ORDER — ENOXAPARIN SODIUM 40 MG/0.4ML ~~LOC~~ SOLN
40.0000 mg | SUBCUTANEOUS | Status: DC
  Administered 2016-03-24: 40 mg via SUBCUTANEOUS
  Filled 2016-03-24: qty 0.4

## 2016-03-24 MED ORDER — METOPROLOL SUCCINATE ER 50 MG PO TB24
100.0000 mg | ORAL_TABLET | Freq: Every day | ORAL | Status: DC
  Administered 2016-03-25 – 2016-03-26 (×2): 100 mg via ORAL
  Filled 2016-03-24: qty 2
  Filled 2016-03-24: qty 1

## 2016-03-24 MED ORDER — ONDANSETRON HCL 4 MG/2ML IJ SOLN
4.0000 mg | Freq: Once | INTRAMUSCULAR | Status: AC
  Administered 2016-03-24: 4 mg via INTRAVENOUS

## 2016-03-24 MED ORDER — ONDANSETRON HCL 4 MG/2ML IJ SOLN
INTRAMUSCULAR | Status: AC
  Filled 2016-03-24: qty 2

## 2016-03-24 MED ORDER — IOPAMIDOL (ISOVUE-300) INJECTION 61%
INTRAVENOUS | Status: AC
  Administered 2016-03-24: 100 mL
  Filled 2016-03-24: qty 100

## 2016-03-24 MED ORDER — CLOPIDOGREL BISULFATE 75 MG PO TABS
75.0000 mg | ORAL_TABLET | Freq: Every day | ORAL | Status: DC
  Administered 2016-03-24 – 2016-03-26 (×3): 75 mg via ORAL
  Filled 2016-03-24 (×3): qty 1

## 2016-03-24 MED ORDER — INSULIN ASPART 100 UNIT/ML ~~LOC~~ SOLN
0.0000 [IU] | SUBCUTANEOUS | Status: DC
  Administered 2016-03-24 (×2): 1 [IU] via SUBCUTANEOUS

## 2016-03-24 MED ORDER — ISOSORBIDE MONONITRATE ER 60 MG PO TB24
60.0000 mg | ORAL_TABLET | Freq: Every day | ORAL | Status: DC
  Administered 2016-03-24 – 2016-03-25 (×2): 60 mg via ORAL
  Filled 2016-03-24 (×2): qty 1

## 2016-03-24 MED ORDER — ONDANSETRON HCL 4 MG/2ML IJ SOLN
4.0000 mg | Freq: Once | INTRAMUSCULAR | Status: DC
Start: 2016-03-24 — End: 2016-03-24

## 2016-03-24 MED ORDER — HEPARIN BOLUS VIA INFUSION
3000.0000 [IU] | Freq: Once | INTRAVENOUS | Status: AC
  Administered 2016-03-24: 3000 [IU] via INTRAVENOUS
  Filled 2016-03-24: qty 3000

## 2016-03-24 MED ORDER — ONDANSETRON HCL 4 MG/2ML IJ SOLN
4.0000 mg | Freq: Four times a day (QID) | INTRAMUSCULAR | Status: DC | PRN
  Administered 2016-03-24: 4 mg via INTRAVENOUS
  Filled 2016-03-24: qty 2

## 2016-03-24 MED ORDER — NITROGLYCERIN 0.4 MG SL SUBL
SUBLINGUAL_TABLET | SUBLINGUAL | Status: AC
  Filled 2016-03-24: qty 1

## 2016-03-24 MED ORDER — OXYCODONE-ACETAMINOPHEN 7.5-325 MG PO TABS
1.0000 | ORAL_TABLET | Freq: Two times a day (BID) | ORAL | Status: DC | PRN
  Administered 2016-03-24 – 2016-03-25 (×2): 1 via ORAL
  Filled 2016-03-24 (×4): qty 1

## 2016-03-24 MED ORDER — NITROGLYCERIN 0.4 MG SL SUBL
0.4000 mg | SUBLINGUAL_TABLET | SUBLINGUAL | Status: DC | PRN
  Administered 2016-03-24 (×3): 0.4 mg via SUBLINGUAL

## 2016-03-24 MED ORDER — HEPARIN (PORCINE) IN NACL 100-0.45 UNIT/ML-% IJ SOLN
1400.0000 [IU]/h | INTRAMUSCULAR | Status: DC
  Administered 2016-03-25: 1400 [IU]/h via INTRAVENOUS
  Filled 2016-03-24 (×2): qty 250

## 2016-03-24 MED ORDER — METOPROLOL SUCCINATE ER 50 MG PO TB24
50.0000 mg | ORAL_TABLET | Freq: Every day | ORAL | Status: DC
  Administered 2016-03-24 – 2016-03-25 (×2): 50 mg via ORAL
  Filled 2016-03-24 (×2): qty 1

## 2016-03-24 MED ORDER — ASPIRIN EC 81 MG PO TBEC
81.0000 mg | DELAYED_RELEASE_TABLET | Freq: Every day | ORAL | Status: DC
  Administered 2016-03-24 – 2016-03-26 (×3): 81 mg via ORAL
  Filled 2016-03-24 (×3): qty 1

## 2016-03-24 MED ORDER — SODIUM CHLORIDE 0.9 % IV SOLN
INTRAVENOUS | Status: AC
  Administered 2016-03-24: 17:00:00 via INTRAVENOUS

## 2016-03-24 MED ORDER — PROMETHAZINE HCL 25 MG/ML IJ SOLN
12.5000 mg | Freq: Once | INTRAMUSCULAR | Status: AC
  Administered 2016-03-24: 12.5 mg via INTRAVENOUS
  Filled 2016-03-24: qty 1

## 2016-03-24 MED ORDER — AMLODIPINE BESYLATE 10 MG PO TABS
10.0000 mg | ORAL_TABLET | Freq: Every day | ORAL | Status: DC
  Filled 2016-03-24: qty 1

## 2016-03-24 MED ORDER — KETOROLAC TROMETHAMINE 15 MG/ML IJ SOLN
15.0000 mg | Freq: Once | INTRAMUSCULAR | Status: AC
  Administered 2016-03-24: 15 mg via INTRAVENOUS
  Filled 2016-03-24: qty 1

## 2016-03-24 MED ORDER — SODIUM CHLORIDE 0.9 % IV SOLN
Freq: Once | INTRAVENOUS | Status: AC
  Administered 2016-03-24: 02:00:00 via INTRAVENOUS

## 2016-03-24 MED ORDER — SODIUM CHLORIDE 0.9 % IV SOLN
INTRAVENOUS | Status: DC
  Administered 2016-03-24: 03:00:00 via INTRAVENOUS
  Administered 2016-03-25 (×2): 1000 mL via INTRAVENOUS

## 2016-03-24 MED ORDER — METOPROLOL SUCCINATE ER 25 MG PO TB24
50.0000 mg | ORAL_TABLET | Freq: Two times a day (BID) | ORAL | Status: DC
  Administered 2016-03-24: 50 mg via ORAL
  Filled 2016-03-24 (×2): qty 2

## 2016-03-24 MED ORDER — AMLODIPINE BESYLATE 10 MG PO TABS
10.0000 mg | ORAL_TABLET | Freq: Every day | ORAL | Status: DC
  Administered 2016-03-24 – 2016-03-25 (×2): 10 mg via ORAL
  Filled 2016-03-24 (×7): qty 1

## 2016-03-24 NOTE — H&P (Addendum)
History and Physical    Carlos Herman [id number] DOB: April 23, 1941 DOA: 03/23/2016   PCP: Kandice Hams, MD Chief Complaint:  Chief Complaint  Patient presents with  . Nausea    HPI: Carlos Herman is a 75 y.o. male with medical history significant of umbilical hernia repair.  Patient presents to the ED with 1 day history of nausea.  Woke up in the morning not feeling well.  Had 1 episode of vomiting after lunchtime.  Persistent nausea since that time.  Does take chronic pain meds and tends to be constipated.  Small BM yesterday but none today.  ED Course: early SBO vs enteritis with ileus on CT scan, patient denies any known sick contacts.  Review of Systems: As per HPI otherwise 10 point review of systems negative.    Past Medical History:  Diagnosis Date  . CAD (coronary artery disease)    a. s/p CABG; b. s/p diffuse stenting to RCA; c. 3/4 grafts known to be occluded;  d.  LHC 12/15:  pLAD 100, small CFx diff disz - no amenable to PCI, RCA severe mid and dist ISR, S-OM, S-RCA 100, L-LAD atretic, S-Dx ok (dist seg of Dx 100) >> PCI:  DES to mRCA and POBA/cutting balloon to dist RCA   . Depression   . DM (diabetes mellitus) ([place])    Type 2  . Dyslipidemia   . Hemorrhoids    internal  . History of Doppler ultrasound    Carotid US (2/16):  Normal carotid arteries bilaterally.  Marland Kitchen History of hiatal hernia   . History of kidney stones   . History of pneumonia   . History of seizures    "when I was hospitalized for stroke"  . Hx of echocardiogram    a. Echo 10/15:  mild LVH, EF 55-60%, no RWMA, Ao root 39 mm, mild MR, mild LAE  . Hypertension   . Leg edema    right  . Myocardial infarction ([place])   . Nephrolithiasis   . Nocturia   . Obesity    morbid  . Osteoarthritis   . Pulmonary embolism ([place])    dx 03/2014 >> On Apixaban  . Stroke ([place]) \   no deficits  . Wears glasses     Past Surgical History:  Procedure Laterality Date  . ANGIOPLASTY / STENTING  ILIAC    . BACK SURGERY     x2  . COLONOSCOPY    . Heart Bypass  1996  . LEFT HEART CATHETERIZATION WITH CORONARY/GRAFT ANGIOGRAM N/A 06/16/2014   Procedure: LEFT HEART CATHETERIZATION WITH Beatrix Fetters;  Surgeon: Burnell Blanks, MD;  Location[facility] CATH LAB;  Service: Cardiovascular;  Laterality: N/A;  . LUMBAR LAMINECTOMY/DECOMPRESSION MICRODISCECTOMY N/A 02/06/2016   Procedure: Laminectomy and Foraminotomy - Lumbar one-Lumbar two;  Surgeon: Eustace Moore, MD;  Location: [facility] NEURO ORS;  Service: Neurosurgery;  Laterality: N/A;  . PERCUTANEOUS CORONARY STENT INTERVENTION (PCI-S)  06/16/2014   Procedure: PERCUTANEOUS CORONARY STENT INTERVENTION (PCI-S);  Surgeon: Burnell Blanks, MD;  Location: [facility] CATH LAB;  Service: Cardiovascular;;  MID RCA, Cutting Balloon and Stent Distal RCA, Cutting Balloon   . TOTAL KNEE ARTHROPLASTY     right  . UMBILICAL HERNIA REPAIR       reports that he has never smoked. He has never used smokeless tobacco. He reports that he drinks alcohol. He reports that he does not use drugs.  Allergies  Allergen Reactions  . Penicillins Swelling and Other (See Comments)  Redness Has patient had a PCN reaction causing immediate rash, facial/tongue/throat swelling, SOB or lightheadedness with hypotension: No Has patient had a PCN reaction causing severe rash involving mucus membranes or skin necrosis: No Has patient had a PCN reaction that required hospitalization No Has patient had a PCN reaction occurring within the last 10 years: Non If all of the above answers are "NO", then may proceed with Cephalosporin use.   . Statins Other (See Comments)      muscle pain    Family History  Problem Relation Age of Onset  . Stroke Mother   . Diabetes Mother   . Heart attack Neg Hx       Prior to Admission medications   Medication Sig Start Date End Date Taking? Authorizing Provider  amLODipine (NORVASC) 10 MG tablet Take 1 tablet (10 mg total)  by mouth at bedtime. 02/02/15  Yes Josue Hector, MD  aspirin EC 81 MG tablet Take 81 mg by mouth daily.   Yes Historical Provider, MD  clopidogrel (PLAVIX) 75 MG tablet Take 1 tablet (75 mg total) by mouth daily with breakfast. 09/21/15  Yes Josue Hector, MD  isosorbide mononitrate (IMDUR) 60 MG 24 hr tablet Take 1 tablet (60 mg total) by mouth at bedtime. 04/13/15  Yes Josue Hector, MD  metFORMIN (GLUCOPHAGE) 500 MG tablet Take 2 tablets (1,000 mg total) by mouth 2 (two) times daily with a meal. 06/18/14  Yes Erlene Quan, PA-C  metoprolol succinate (TOPROL-XL) 100 MG 24 hr tablet take 1 tablet by mouth every morning and 1/2 tablet every evening (TAKE WITH OR IMMEDIATELY AFTER MEALS) 03/05/16  Yes Josue Hector, MD  Multiple Minerals-Vitamins (CAL MAG ZINC +D3 PO) Take 1 tablet by mouth 2 (two) times daily. Per 3 tablets; Calcium 1000 mg, magnesium 400 mg, zinc 25 mg, vitamin D3 600 I.U.   Yes Historical Provider, MD  nitroGLYCERIN (NITROSTAT) 0.4 MG SL tablet Place 1 tablet (0.4 mg total) under the tongue every 5 (five) minutes as needed for chest pain. 07/06/14  Yes Josue Hector, MD  oxyCODONE-acetaminophen (PERCOCET) 7.5-325 MG tablet Take 1 tablet by mouth 2 (two) times daily.  01/30/16  Yes Historical Provider, MD    Physical Exam: Vitals:   03/23/16 2330 03/24/16 0000 03/24/16 0100 03/24/16 0201  BP: (!) 139/54 (!) 137/51 170/75 (!) 134/51  Pulse: 71 75 68 75  Resp: 20 22 18 19   Temp:      TempSrc:      SpO2: 91% 94% 97% 96%      Constitutional: NAD, calm, comfortable Eyes: PERRL, lids and conjunctivae normal ENMT: Mucous membranes are moist. Posterior pharynx clear of any exudate or lesions.Normal dentition.  Neck: normal, supple, no masses, no thyromegaly Respiratory: clear to auscultation bilaterally, no wheezing, no crackles. Normal respiratory effort. No accessory muscle use.  Cardiovascular: Regular rate and rhythm, no murmurs / rubs / gallops. No extremity edema. 2+ pedal  pulses. No carotid bruits.  Abdomen: no tenderness, no masses palpated. No hepatosplenomegaly. Bowel sounds positive.  Musculoskeletal: no clubbing / cyanosis. No joint deformity upper and lower extremities. Good ROM, no contractures. Normal muscle tone.  Skin: no rashes, lesions, ulcers. No induration Neurologic: CN 2-12 grossly intact. Sensation intact, DTR normal. Strength 5/5 in all 4.  Psychiatric: Normal judgment and insight. Alert and oriented x 3. Normal mood.    Labs on Admission: I have personally reviewed following labs and imaging studies  CBC:  Recent Labs Lab 03/23/16 2149  WBC 14.5*  NEUTROABS 11.8*  HGB 14.9  HCT 44.8  MCV 95.5  PLT 123456   Basic Metabolic Panel:  Recent Labs Lab 03/23/16 2149  NA 134*  K 4.5  CL 100*  CO2 21*  GLUCOSE 190*  BUN 12  CREATININE 1.02  CALCIUM 9.4   GFR: CrCl cannot be calculated (Unknown ideal weight.). Liver Function Tests:  Recent Labs Lab 03/23/16 2149  AST 20  ALT 14*  ALKPHOS 78  BILITOT 0.8  PROT 7.1  ALBUMIN 4.1   No results for input(s): LIPASE, AMYLASE in the last 168 hours. No results for input(s): AMMONIA in the last 168 hours. Coagulation Profile: No results for input(s): INR, PROTIME in the last 168 hours. Cardiac Enzymes: No results for input(s): CKTOTAL, CKMB, CKMBINDEX, TROPONINI in the last 168 hours. BNP (last 3 results) No results for input(s): PROBNP in the last 8760 hours. HbA1C: No results for input(s): HGBA1C in the last 72 hours. CBG: No results for input(s): GLUCAP in the last 168 hours. Lipid Profile: No results for input(s): CHOL, HDL, LDLCALC, TRIG, CHOLHDL, LDLDIRECT in the last 72 hours. Thyroid Function Tests: No results for input(s): TSH, T4TOTAL, FREET4, T3FREE, THYROIDAB in the last 72 hours. Anemia Panel: No results for input(s): VITAMINB12, FOLATE, FERRITIN, TIBC, IRON, RETICCTPCT in the last 72 hours. Urine analysis:    Component Value Date/Time   COLORURINE YELLOW  03/23/2016 0037   APPEARANCEUR CLEAR 03/23/2016 0037   LABSPEC 1.020 03/23/2016 0037   PHURINE 5.5 03/23/2016 0037   GLUCOSEU NEGATIVE 03/23/2016 0037   HGBUR NEGATIVE 03/23/2016 0037   BILIRUBINUR NEGATIVE 03/23/2016 0037   KETONESUR 15 (A) 03/23/2016 0037   PROTEINUR NEGATIVE 03/23/2016 0037   UROBILINOGEN 0.2 02/11/2010 0800   NITRITE NEGATIVE 03/23/2016 0037   LEUKOCYTESUR NEGATIVE 03/23/2016 0037   Sepsis Labs: @LABRCNTIP (procalcitonin:4,lacticidven:4) )No results found for this or any previous visit (from the past 240 hour(s)).   Radiological Exams on Admission: Ct Abdomen Pelvis W Contrast  Result Date: 03/24/2016 CLINICAL DATA:  75 year old male with abdominal pain. EXAM: CT ABDOMEN AND PELVIS WITH CONTRAST TECHNIQUE: Multidetector CT imaging of the abdomen and pelvis was performed using the standard protocol following bolus administration of intravenous contrast. CONTRAST:  136mL ISOVUE-300 IOPAMIDOL (ISOVUE-300) INJECTION 61% COMPARISON:  Abdominal radiograph dated 03/23/2016 and CT dated 04/27/2014 FINDINGS: Lower chest: Right lung base subsegmental atelectasis versus pneumonia. There are bilateral lower lobe pulmonary artery emboli which appear linear in morphology and similar to the PE seen on the chest CT dated 04/10/2014. There is coronary vascular calcification and CABG surgery. No intra-abdominal free air or free fluid. Hepatobiliary: Small stones within the gallbladder fundus. No pericholecystic fluid. Probable mild fatty infiltration of the liver. No biliary ductal dilatation. Pancreas: There multiple hypodense lesions in the pancreas measuring up to 1.5 cm in the proximal body of the pancreas. These lesions are grossly similar to the lesion seen on the prior CT of 2015 further evaluation with MRI without and with contrast, if not performed, recommended. There is no dilatation of the main pancreatic duct. No active pancreatic inflammatory changes. Spleen: Normal in size without  focal abnormality. Adrenals/Urinary Tract: The adrenal glands appear unremarkable. There is mild bilateral renal atrophy. Multiple right renal calculi noted. There is a 1 cm stone in the right renal pelvis with mild right hydronephrosis. Multiple smaller right renal inferior pole calculi noted. There is no hydronephrosis or nephrolithiasis on the left. The 1.5 cm left renal inferior pole hypodense lesion appears similar to prior  study but incompletely characterized on this CT. This most likely represents a cyst. Further evaluation with nonemergent ultrasound recommended. The visualized ureters and urinary bladder appear unremarkable. Stomach/Bowel: There is sigmoid diverticulosis and scattered colonic diverticula without active inflammatory changes. Multiple mildly dilated loops of small bowel in the mid to lower abdomen noted. There is segmental thickening of the small bowel in the lower abdomen and right hemipelvis likely representing enteritis with associated ileus or low-grade/ with transition zone in the right hemipelvis. The distal small bowel and terminal ileum are decompressed. Normal appendix. Vascular/Lymphatic: There is moderate aortoiliac atherosclerotic disease. There is heavy atherosclerotic calcification of the origins of the celiac axis, SMA, and IMA. The origins of the these vessels however remain patent. No portal venous gas identified. There is no adenopathy. Reproductive: The prostate and seminal vesicles are grossly unremarkable. 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 Multilevel degenerative changes of the spine with osteophyte formation. L4-L5 spinous process fixation hardware. No acute fracture. Postsurgical changes of the lower lumbar spine. There is laminectomy at L2. A 3.5 x 1.7 cm fluid collection in the posterior paraspinal soft tissues in the midline at this level noted. IMPRESSION: Findings likely  represent enteritis with segmental ileus versus an early small-bowel obstruction with transition zone in the right hemipelvis. Clinical correlation and follow-up recommended. Cholelithiasis. Right renal calculi with mild right hydronephrosis. Stable appearing hypodense pancreatic lesions. Further evaluation with nonemergent MRI without and with contrast recommended. Electronically Signed   By: Anner Crete M.D.   On: 03/24/2016 01:38   Dg Abd Acute W/chest  Result Date: 03/23/2016 CLINICAL DATA:  Acute onset of nausea and vomiting. Initial encounter. EXAM: DG ABDOMEN ACUTE W/ 1V CHEST COMPARISON:  Chest radiograph performed 01/29/2016, and CT of the abdomen and pelvis from 04/27/2014 FINDINGS: The lungs are well-aerated. There is mild elevation of the right hemidiaphragm. There is no evidence of focal opacification, pleural effusion or pneumothorax. The cardiomediastinal silhouette is mildly enlarged. The patient is status post median sternotomy, with evidence of prior CABG. There is distention of small-bowel loops to 4.7 cm in maximal diameter, with suggestion of underlying diffuse small bowel wall thickening. This may reflect underlying infectious or inflammatory ileitis. Air is still seen within the colon, without definite evidence for bowel obstruction. Would correlate with the patient's symptoms. No acute osseous abnormalities are seen; the sacroiliac joints are unremarkable in appearance. The patient's right hip arthroplasty is grossly unremarkable in appearance, though incompletely imaged. Lumbar spinal fusion hardware is also seen. IMPRESSION: 1. Distention of small-bowel loops to 4.7 cm in maximal diameter, with suggestion of underlying diffuse bowel wall thickening. This may reflect underlying infectious or inflammatory ileitis. The appearance is less typical for bowel obstruction. Would correlate with the patient's symptoms. 2. Mild elevation of the right hemidiaphragm. Mild cardiomegaly. Lungs  remain grossly clear. Electronically Signed   By: Garald Balding M.D.   On: 03/23/2016 22:58    EKG: Independently reviewed.  Assessment/Plan Principal Problem:   SBO (small bowel obstruction) (HCC) Active Problems:   Pancreatic lesion   Small bowel obstruction ([place])    1. SBO - vs enteritis with ileus - 1. NPO 2. IVF 3. zofran prn 4. Put in NGT if vomiting becomes persistent and recurrent 5. If persists then would consult surgery but given that he dosent have a very impressive surgical history to generate a lot of scar tissue in abdomen, and we arnt even sure it is  SBO (as opposed to infectious enteritis with ileus), will hold off for now. 2. Pancreatic lesion - 1. Needs follow up MRI non-emergently 2. D/W patient, Patient would prefer to have this done as outpatient in an open MRI (we dont have open MRI here at [facility]).   DVT prophylaxis: Lovenox Code Status: Full Family Communication: Son at bedside Consults called: None Admission status: Admit to inpatient   Etta Quill DO [facility] Pager [phone] from 7PM-7AM  If 7AM-7PM, please contact the day physician for the patient [email or web address] Password [email or web address]  03/24/2016, 2:19 AM

## 2016-03-24 NOTE — Progress Notes (Signed)
Patient having 10/10 chest pain. Paged Dr.Vann no new orders at this time. Also paged rapid response who said that they will be up here. Will continue to monitor

## 2016-03-24 NOTE — Progress Notes (Signed)
New Admission Note:   Arrival Method wheelchair with ED nurse Mental Orientation:  Alert and oriented x4 Telemetry: n/a Assessment: Completed Skin:  Generalized brusing IV: RFA Pain: 4 pt states this is his normal Tubes: Safety Measures: Safety Fall Prevention Plan has been given, discussed  Admission: Completed Bee Orientation: Patient has been orientated to the room, unit and staff.  Family: son Saralyn Pilar  Orders have been reviewed and implemented. Will continue to monitor the patient. Call light has been placed within reach.   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 Explained to patient that it is hospital policy to have bed alarm when patient is a high risk for fall as well as the yellow socks. Patient was educated and refused. Will continue to monitor  George Hugh  BSN, RN  Phone number: [phone]

## 2016-03-24 NOTE — ED Notes (Signed)
Patient returned from CT

## 2016-03-24 NOTE — Consult Note (Signed)
Cardiology Consult    Patient ID: Carlos Herman MRN: [id number], DOB/AGE: [id number]   Admit date: 03/23/2016 Date of Consult: 03/24/2016  Primary Physician: Carlos Hams, MD Primary Cardiologist: Dr. Johnsie Herman Requesting Provider: Dr. Eliseo Herman Reason for Consultation: Chest pain  Patient Profile    75 yo male with PMH of of CAD s/p CABG in 1996 with subsequent stenting (09, 15), DM, HTN, HLD who presented to the [facility] ED with reports of abdominal pain on 9/24.  Past Medical History   Past Medical History:  Diagnosis Date  . CAD (coronary artery disease)    a. s/p CABG; b. s/p diffuse stenting to RCA; c. 3/4 grafts known to be occluded;  d.  LHC 12/15:  pLAD 100, small CFx diff disz - no amenable to PCI, RCA severe mid and dist ISR, S-OM, S-RCA 100, L-LAD atretic, S-Dx ok (dist seg of Dx 100) >> PCI:  DES to mRCA and POBA/cutting balloon to dist RCA   . Depression   . DM (diabetes mellitus) ([place])    Type 2  . Dyslipidemia   . Hemorrhoids    internal  . History of Doppler ultrasound    Carotid US (2/16):  Normal carotid arteries bilaterally.  Marland Kitchen History of hiatal hernia   . History of kidney stones   . History of pneumonia   . History of seizures    "when I was hospitalized for stroke"  . Hx of echocardiogram    a. Echo 10/15:  mild LVH, EF 55-60%, no RWMA, Ao root 39 mm, mild MR, mild LAE  . Hypertension   . Leg edema    right  . Myocardial infarction ([place])   . Nephrolithiasis   . Nocturia   . Obesity    morbid  . Osteoarthritis   . Pulmonary embolism ([place])    dx 03/2014 >> On Apixaban  . Stroke ([place]) \   no deficits  . Wears glasses     Past Surgical History:  Procedure Laterality Date  . ANGIOPLASTY / STENTING ILIAC    . BACK SURGERY     x2  . COLONOSCOPY    . Heart Bypass  1996  . LEFT HEART CATHETERIZATION WITH CORONARY/GRAFT ANGIOGRAM N/A 06/16/2014   Procedure: LEFT HEART CATHETERIZATION WITH Beatrix Fetters;  Surgeon: Carlos Blanks, MD;  Location: [facility] CATH LAB;  Service: Cardiovascular;  Laterality: N/A;  . LUMBAR LAMINECTOMY/DECOMPRESSION MICRODISCECTOMY N/A 02/06/2016   Procedure: Laminectomy and Foraminotomy - Lumbar one-Lumbar two;  Surgeon: Carlos Moore, MD;  Location: [facility];  Service: Neurosurgery;  Laterality: N/A;  . PERCUTANEOUS CORONARY STENT INTERVENTION (PCI-S)  06/16/2014   Procedure: PERCUTANEOUS CORONARY STENT INTERVENTION (PCI-S);  Surgeon: Carlos Blanks, MD;  Location: [facility] CATH LAB;  Service: Cardiovascular;;  MID RCA, Cutting Balloon and Stent Distal RCA, Cutting Balloon   . TOTAL KNEE ARTHROPLASTY     right  . UMBILICAL HERNIA REPAIR       Allergies  Allergies  Allergen Reactions  . Penicillins Swelling and Other (See Comments)    Redness Has patient had a PCN reaction causing immediate rash, facial/tongue/throat swelling, SOB or lightheadedness with hypotension: No Has patient had a PCN reaction causing severe rash involving mucus membranes or skin necrosis: No Has patient had a PCN reaction that required hospitalization No Has patient had a PCN reaction occurring within the last 10 years: Non If all of the above answers are "NO", then may proceed with Cephalosporin use.   . Statins  Other (See Comments)      muscle pain    History of Present Illness    Carlos Herman is a 75 year old male with past medical history of coronary artery disease S/P CABG in 1996. He underwent cath in 2009 with noted patent RCA with diffuse stenting, occluded circumflex and LAD with patent SVG to diagonal feeling the LAD, but occluded SVG to OM and SVG to RCA. He was placed on anticoagulation with Eliquis in October 2015 for bilateral PEs. He was again seen in the office in December 15 where he complained of chest pain with angina and was placed for cardiac cath with significant disease in his RCA. He underwent PCI with DES to his mid RCA with Cutting Balloon to distal RCA. He was then continued  on Eliquis and Plavix for a year and no aspirin. He was followed up in the office on 2/16 with an APP and reported complaints of dizziness and his blood pressure was noted to be elevated. He was advised to hold his ramapril and follow-up. He was placed on a Holter monitor which noted PACs and PVCs with no arrhythmia. Carotids were reviewed on 2/16 and normal. He was last seen in the office on 3/17 by Dr. Johnsie Herman, or he reported his dizziness had resolved. He was continued on his home medications with no changes.   He reports being in his usual state of health until recently. He presented to the [facility] ED c/o nausea and abd pain for the past 2 days. In the ED he had a CT scan which noted early SOB vs enteritis with ileus. He was admitted to Internal Medicine for further work up.   Reports this morning he woke up with his feet tangled in the sheets and became very agitated and upset because he has neuropathy in his feet. States he then began having chest pain and diaphoresis. He then called out to the nursing station asking for nitro for his chest pain. Given his report of chest pain and RR event was called. A EKG was done at that time, and he reports it was a long time waiting for his nitro. Was given 2 SL nitro with resolution of chest pain. States he experienced pain for about an hour. Trops were cycled with first 2 neg, and last 0.17. EKG with new pronounced ST depression in the lateral leads. No further episodes of chest pain since this morning. In talking with patient he does reports taking nitro on a more frequent basis at home with exertional activity.   Inpatient Medications    . sodium chloride   Intravenous STAT  . amLODipine  10 mg Oral Daily  . aspirin EC  81 mg Oral Daily  . clopidogrel  75 mg Oral Q breakfast  . enoxaparin (LOVENOX) injection  40 mg Subcutaneous Q24H  . insulin aspart  0-9 Units Subcutaneous Q4H  . isosorbide mononitrate  60 mg Oral QHS  . metoprolol succinate  50-100  mg Oral BID  . nitroGLYCERIN        Family History    Family History  Problem Relation Age of Onset  . Stroke Mother   . Diabetes Mother   . Heart attack Neg Hx     Social History    Social History   Social History  . Marital status: Legally Separated    Spouse name: N/A  . Number of children: N/A  . Years of education: N/A   Occupational History  . Not on file.  Social History Main Topics  . Smoking status: Never Smoker  . Smokeless tobacco: Never Used  . Alcohol use Yes     Comment: Rarely  . Drug use: No  . Sexual activity: Not on file   Other Topics Concern  . Not on file   Social History Narrative   Enjoys racing and working on cars     Review of Systems    General:  No chills, fever, night sweats or weight changes.  Cardiovascular:  See HPI Dermatological: No rash, lesions/masses Respiratory: No cough, dyspnea Urologic: No hematuria, dysuria Abdominal: See HPI Neurologic:  No visual changes, wkns, changes in mental status. All other systems reviewed and are otherwise negative except as noted above.  Physical Exam    Blood pressure 127/82, pulse 71, temperature 97.7 F (36.5 C), temperature source Oral, resp. rate 20, height 5\' 9"  (1.753 m), weight 224 lb 1.6 oz (101.7 kg), SpO2 96 %.  General: Older caucasian male, NAD Psych: Normal affect. Neuro: Alert and oriented X 3. Moves all extremities spontaneously. HEENT: Normal  Neck: Supple without bruits or JVD. Lungs:  Resp regular and unlabored, CTA. Heart: RRR no s3, s4, or murmurs. Abdomen: Soft, non-tender, non-distended, BS hypoactive.  Extremities: No clubbing, cyanosis or edema. DP/PT/Radials 2+ and equal bilaterally.  Labs    Troponin [facility] Test)  Recent Labs  03/23/16 2156  TROPIPOC 0.00    Recent Labs  03/24/16 0827 03/24/16 1339  TROPONINI <0.03 0.17*   Lab Results  Component Value Date   WBC 14.5 (H) 03/23/2016   HGB 14.9 03/23/2016   HCT 44.8 03/23/2016    MCV 95.5 03/23/2016   PLT 358 03/23/2016    Recent Labs Lab 03/23/16 2149  NA 134*  K 4.5  CL 100*  CO2 21*  BUN 12  CREATININE 1.02  CALCIUM 9.4  PROT 7.1  BILITOT 0.8  ALKPHOS 78  ALT 14*  AST 20  GLUCOSE 190*   Lab Results  Component Value Date   CHOL  08/03/2007    156        ATP III CLASSIFICATION:  <200     mg/dL   Desirable  200-239  mg/dL   Borderline High  >=240    mg/dL   High   HDL 28 (L) 08/03/2007   LDLCALC (H) 08/03/2007    104        Total Cholesterol/HDL:CHD Risk Coronary Heart Disease Risk Table                     Men   Women  1/2 Average Risk   3.4   3.3   TRIG 119 08/03/2007   Lab Results  Component Value Date   DDIMER 3.04 (H) 04/09/2014     Radiology Studies    Ct Abdomen Pelvis W Contrast  Result Date: 03/24/2016 CLINICAL DATA:  75 year old male with abdominal pain. EXAM: CT ABDOMEN AND PELVIS WITH CONTRAST TECHNIQUE: Multidetector CT imaging of the abdomen and pelvis was performed using the standard protocol following bolus administration of intravenous contrast. CONTRAST:  114mL ISOVUE-300 IOPAMIDOL (ISOVUE-300) INJECTION 61% COMPARISON:  Abdominal radiograph dated 03/23/2016 and CT dated 04/27/2014 FINDINGS: Lower chest: Right lung base subsegmental atelectasis versus pneumonia. There are bilateral lower lobe pulmonary artery emboli which appear linear in morphology and similar to the PE seen on the chest CT dated 04/10/2014. There is coronary vascular calcification and CABG surgery. No intra-abdominal free air or free fluid. Hepatobiliary: Small stones within the  gallbladder fundus. No pericholecystic fluid. Probable mild fatty infiltration of the liver. No biliary ductal dilatation. Pancreas: There multiple hypodense lesions in the pancreas measuring up to 1.5 cm in the proximal body of the pancreas. These lesions are grossly similar to the lesion seen on the prior CT of 2015 further evaluation with MRI without and with contrast, if not  performed, recommended. There is no dilatation of the main pancreatic duct. No active pancreatic inflammatory changes. Spleen: Normal in size without focal abnormality. Adrenals/Urinary Tract: The adrenal glands appear unremarkable. There is mild bilateral renal atrophy. Multiple right renal calculi noted. There is a 1 cm stone in the right renal pelvis with mild right hydronephrosis. Multiple smaller right renal inferior pole calculi noted. There is no hydronephrosis or nephrolithiasis on the left. The 1.5 cm left renal inferior pole hypodense lesion appears similar to prior study but incompletely characterized on this CT. This most likely represents a cyst. Further evaluation with nonemergent ultrasound recommended. The visualized ureters and urinary bladder appear unremarkable. Stomach/Bowel: There is sigmoid diverticulosis and scattered colonic diverticula without active inflammatory changes. Multiple mildly dilated loops of small bowel in the mid to lower abdomen noted. There is segmental thickening of the small bowel in the lower abdomen and right hemipelvis likely representing enteritis with associated ileus or low-grade/ with transition zone in the right hemipelvis. The distal small bowel and terminal ileum are decompressed. Normal appendix. Vascular/Lymphatic: There is moderate aortoiliac atherosclerotic disease. There is heavy atherosclerotic calcification of the origins of the celiac axis, SMA, and IMA. The origins of the these vessels however remain patent. No portal venous gas identified. There is no adenopathy. Reproductive: The prostate and seminal vesicles are grossly unremarkable. 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 Multilevel degenerative changes of the spine with osteophyte formation. L4-L5 spinous process fixation hardware. No acute fracture. Postsurgical changes of the lower lumbar spine. There is laminectomy  at L2. A 3.5 x 1.7 cm fluid collection in the posterior paraspinal soft tissues in the midline at this level noted. IMPRESSION: Findings likely represent enteritis with segmental ileus versus an early small-bowel obstruction with transition zone in the right hemipelvis. Clinical correlation and follow-up recommended. Cholelithiasis. Right renal calculi with mild right hydronephrosis. Stable appearing hypodense pancreatic lesions. Further evaluation with nonemergent MRI without and with contrast recommended. Electronically Signed   By: Anner Crete M.D.   On: 03/24/2016 01:38   Dg Abd Acute W/chest  Result Date: 03/23/2016 CLINICAL DATA:  Acute onset of nausea and vomiting. Initial encounter. EXAM: DG ABDOMEN ACUTE W/ 1V CHEST COMPARISON:  Chest radiograph performed 01/29/2016, and CT of the abdomen and pelvis from 04/27/2014 FINDINGS: The lungs are well-aerated. There is mild elevation of the right hemidiaphragm. There is no evidence of focal opacification, pleural effusion or pneumothorax. The cardiomediastinal silhouette is mildly enlarged. The patient is status post median sternotomy, with evidence of prior CABG. There is distention of small-bowel loops to 4.7 cm in maximal diameter, with suggestion of underlying diffuse small bowel wall thickening. This may reflect underlying infectious or inflammatory ileitis. Air is still seen within the colon, without definite evidence for bowel obstruction. Would correlate with the patient's symptoms. No acute osseous abnormalities are seen; the sacroiliac joints are unremarkable in appearance. The patient's right hip arthroplasty is grossly unremarkable in appearance, though incompletely imaged. Lumbar spinal fusion hardware is also seen. IMPRESSION: 1. Distention of small-bowel loops to 4.7 cm in maximal diameter, with  suggestion of underlying diffuse bowel wall thickening. This may reflect underlying infectious or inflammatory ileitis. The appearance is less  typical for bowel obstruction. Would correlate with the patient's symptoms. 2. Mild elevation of the right hemidiaphragm. Mild cardiomegaly. Lungs remain grossly clear. Electronically Signed   By: Garald Balding M.D.   On: 03/23/2016 22:58    ECG & Cardiac Imaging    EKG: SR with more pronounced ST depression in lateral leads.  Echo: 10/15  Study Conclusions  - Procedure narrative: The rhythm appears to be atrial fibrillation. - Left ventricle: The cavity size was normal. Wall thickness was increased in a pattern of mild LVH. Systolic function was normal. The estimated ejection fraction was in the range of 55% to 60%. Wall motion was normal; there were no regional wall motion abnormalities. The study is not technically sufficient to allow evaluation of LV diastolic function. - Aorta: Aortic root dimension: 39 mm (ED). - Aortic root: The aortic root is top normal in size. - Mitral valve: Mildly thickened leaflets . There was mild regurgitation. - Left atrium: Mildly dilated (36 ml/m2).  Impressions:  - LVEF 55-60%, mild MR, mild LAE, no clear active atrial activity (appears to be a-fib), cannot determine diastolic function.  Assessment & Plan    75 yo male with PMH of of CAD s/p CABG in 1996 with subsequent stenting (09, 15), DM, HTN, HLD who presented to the [facility] ED with reports of abdominal pain on 9/24.  1. Unstable Angina: Reports becoming upset this morning after getting his feet tangled in the sheets and developed chest pain. EKG was done showing more pronounced ST depression in lateral leads. Trop negx2, with last readings slight elevated 0.19. Was given 2 SL nitro with relief of pain, no further episodes. He has known diffuse disease with last cath in 2015. Did have a noted elevation in his blood pressure this morning during episode of chest pain. Could be demand ischemia.  -- Start IV heparin, trend next trop. May ultimately need cath.  -- Currently  NPO related abd issues, continue with medical therapy.   2. Abd pain-SBO vs enteritis with ileus: Work up per primary team. Currently NPO with IV fluids  Signed, Reino Bellis, NP-C Pager [phone] 03/24/2016, 4:03 PM

## 2016-03-24 NOTE — ED Notes (Signed)
Admitting MD at bedside.

## 2016-03-24 NOTE — Progress Notes (Signed)
Pt had orders to receive morning dose of amlodipine , but is unable to take PO medication at this time due to vomiting.Spoek with K.schorr who will be putting in orders for IV toradol and hydralazine until nausea and vomiting is controlled. Will continue to monitor

## 2016-03-24 NOTE — Progress Notes (Signed)
Son approached me and was wanting to know what's going on with the plan of care for his dad.  I told him I just came on, and would look at the notes.  He said they (he and his dad) had been waiting all day for Dr Eliseo Squires to come by, and never showed. I explained the drs. Do not usually round at night.

## 2016-03-24 NOTE — Progress Notes (Addendum)
Patient admitted after midnight, please see H&P.  Patient c/o his usual CP this AM--- sees Dr. Johnsie Cancel.  Cycle CE, given nitro x 2 and is chest pain free.  Placed on tele.  Says he did have flatus this AM.  Follow for possible SBO vs enteritis with ileus  Eulogio Bear DO  EKG with some lateral lead ST depression-- CE elevated at 0.17-- will consult cardiology

## 2016-03-24 NOTE — ED Notes (Signed)
Report attempted 

## 2016-03-24 NOTE — Progress Notes (Signed)
Clarification of ToprolXL from pharmacy.  100 in the am and 50 at night.  Patient refused to let me put the bed alarm on.  Educated on safety. Patient adamant. Will round for safety.

## 2016-03-24 NOTE — Care Management Note (Signed)
Case Management Note  Patient Details  Name: Carlos Herman MRN: [id number] Date of Birth: June 19, 1941  Subjective/Objective:                 Patient from home alone, independent. Support is son Saralyn Pilar. Has walker and cane. Possible early SBO, NPO with IVF.    Action/Plan:  CM will continue to follow.  Expected Discharge Date:                  Expected Discharge Plan:  Home/Self Care  In-House Referral:     Discharge planning Services  CM Consult  Post Acute Care Choice:    Choice offered to:     DME Arranged:    DME Agency:     HH Arranged:    HH Agency:     Status of Service:  In process, will continue to follow  If discussed at Long Length of Stay Meetings, dates discussed:    Additional Comments:  Carles Collet, RN 03/24/2016, 3:26 PM

## 2016-03-24 NOTE — Significant Event (Signed)
Rapid Response Event Note  Overview: Time Called: 0720 Arrival Time: 0725 Event Type: Cardiac  Initial Focused Assessment: Patient complaining of CP 10/10  Lung sounds clear, heart tones regular He states that he takes NTG frequently at home and usually has to take 2 to relive symptoms BP 175/79  HR 72  RR 18  O2 sat 98% on RA  Interventions: Sl NTG given Placed patient on 2l [place] 12 lead EKG done  CP improved to 6/10 BP 159/70  HR 80 Additional SL NTG given Patient CP free   Dr Eliseo Squires at bedside to assess patient. Labs ordered Placed patient on tele  Plan of Care (if not transferred): RN to call if patient with additional CP or if assistance needed.  Event Summary: Name of Physician Notified: Eliseo Squires at 0720    at    Outcome: Stayed in room and stabalized  Event End Time: 0830  Raliegh Ip

## 2016-03-24 NOTE — Progress Notes (Signed)
ANTICOAGULATION CONSULT NOTE - Initial Consult  Pharmacy Consult for Heparin Indication: chest pain/ACS  Allergies  Allergen Reactions  . Penicillins Swelling and Other (See Comments)    Redness Has patient had a PCN reaction causing immediate rash, facial/tongue/throat swelling, SOB or lightheadedness with hypotension: No Has patient had a PCN reaction causing severe rash involving mucus membranes or skin necrosis: No Has patient had a PCN reaction that required hospitalization No Has patient had a PCN reaction occurring within the last 10 years: Non If all of the above answers are "NO", then may proceed with Cephalosporin use.   . Statins Other (See Comments)      muscle pain    Patient Measurements: Height: 5\' 9"  (175.3 cm) Weight: 224 lb 1.6 oz (101.7 kg) IBW/kg (Calculated) : 70.7 Heparin Dosing Weight:  94  Vital Signs: Temp: 97.7 F (36.5 C) (09/25 1400) Temp Source: Oral (09/25 1400) BP: 127/82 (09/25 1400) Pulse Rate: 71 (09/25 1400)  Labs:  Recent Labs  03/23/16 2149 03/24/16 0827 03/24/16 1339  HGB 14.9  --   --   HCT 44.8  --   --   PLT 358  --   --   CREATININE 1.02  --   --   TROPONINI  --  <0.03 0.17*    Estimated Creatinine Clearance: 74.7 mL/min (by C-G formula based on SCr of 1.02 mg/dL).   Medical History: Past Medical History:  Diagnosis Date  . CAD (coronary artery disease)    a. s/p CABG; b. s/p diffuse stenting to RCA; c. 3/4 grafts known to be occluded;  d.  LHC 12/15:  pLAD 100, small CFx diff disz - no amenable to PCI, RCA severe mid and dist ISR, S-OM, S-RCA 100, L-LAD atretic, S-Dx ok (dist seg of Dx 100) >> PCI:  DES to mRCA and POBA/cutting balloon to dist RCA   . Depression   . DM (diabetes mellitus) ([place])    Type 2  . Dyslipidemia   . Hemorrhoids    internal  . History of Doppler ultrasound    Carotid US (2/16):  Normal carotid arteries bilaterally.  Marland Kitchen History of hiatal hernia   . History of kidney stones   . History of  pneumonia   . History of seizures    "when I was hospitalized for stroke"  . Hx of echocardiogram    a. Echo 10/15:  mild LVH, EF 55-60%, no RWMA, Ao root 39 mm, mild MR, mild LAE  . Hypertension   . Leg edema    right  . Myocardial infarction ([place])   . Nephrolithiasis   . Nocturia   . Obesity    morbid  . Osteoarthritis   . Pulmonary embolism ([place])    dx 03/2014 >> On Apixaban  . Stroke ([place]) \   no deficits  . Wears glasses      Assessment: 74 year old male to begin heparin for chest pain Received Lovenox 40 mg at 1400 pm  Goal of Therapy:  Heparin level 0.3-0.7 units/ml Monitor platelets by anticoagulation protocol: Yes   Plan:  Heparin 3000 units iv bolus x 1 Heparin drip at 1400 units / hr 8 hour heparin level Daily heparin level, CBC  Thank you Anette Guarneri, PharmD [phone] 03/24/2016,5:04 PM

## 2016-03-24 NOTE — ED Notes (Signed)
Patient transported to CT 

## 2016-03-25 ENCOUNTER — Encounter (HOSPITAL_COMMUNITY)
Admission: EM | Disposition: A | Discharge status: Home / Self Care | Payer: Self-pay | Source: Home / Self Care | Attending: Internal Medicine

## 2016-03-25 DIAGNOSIS — I25110 Atherosclerotic heart disease of native coronary artery with unstable angina pectoris: Secondary | ICD-10-CM

## 2016-03-25 DIAGNOSIS — I10 Essential (primary) hypertension: Secondary | ICD-10-CM

## 2016-03-25 DIAGNOSIS — I214 Non-ST elevation (NSTEMI) myocardial infarction: Secondary | ICD-10-CM

## 2016-03-25 DIAGNOSIS — R778 Other specified abnormalities of plasma proteins: Secondary | ICD-10-CM | POA: Insufficient documentation

## 2016-03-25 DIAGNOSIS — Z951 Presence of aortocoronary bypass graft: Secondary | ICD-10-CM

## 2016-03-25 DIAGNOSIS — I25810 Atherosclerosis of coronary artery bypass graft(s) without angina pectoris: Secondary | ICD-10-CM

## 2016-03-25 DIAGNOSIS — R7989 Other specified abnormal findings of blood chemistry: Secondary | ICD-10-CM

## 2016-03-25 HISTORY — PX: CARDIAC CATHETERIZATION: SHX172

## 2016-03-25 LAB — GLUCOSE, CAPILLARY
Glucose-Capillary: 110 mg/dL — ABNORMAL HIGH (ref 65–99)
Glucose-Capillary: 108 mg/dL — ABNORMAL HIGH (ref 65–99)
Glucose-Capillary: 113 mg/dL — ABNORMAL HIGH (ref 65–99)
Glucose-Capillary: 110 mg/dL — ABNORMAL HIGH (ref 65–99)
Glucose-Capillary: 141 mg/dL — ABNORMAL HIGH (ref 65–99)

## 2016-03-25 LAB — LIPID PANEL
CHOL/HDL RATIO: 5 ratio
Cholesterol: 120 mg/dL (ref 0–200)
HDL: 24 mg/dL — AB (ref >40)
LDL CALC: 64 mg/dL (ref 0–99)
Triglycerides: 158 mg/dL — ABNORMAL HIGH (ref <150)
VLDL: 32 mg/dL (ref 0–40)

## 2016-03-25 LAB — CBC
HCT: 36.4 % — ABNORMAL LOW (ref 39.0–52.0)
HCT: 41.3 % (ref 39.0–52.0)
HEMOGLOBIN: 11.9 g/dL — AB (ref 13.0–17.0)
Hemoglobin: 13 g/dL (ref 13.0–17.0)
MCH: 31.6 pg (ref 26.0–34.0)
MCH: 31.3 pg (ref 26.0–34.0)
MCHC: 32.7 g/dL (ref 30.0–36.0)
MCHC: 31.5 g/dL (ref 30.0–36.0)
MCV: 96.8 fL (ref 78.0–100.0)
MCV: 99.5 fL (ref 78.0–100.0)
PLATELETS: 260 10*3/uL (ref 150–400)
Platelets: 269 10*3/uL (ref 150–400)
RBC: 3.76 MIL/uL — AB (ref 4.22–5.81)
RBC: 4.15 MIL/uL — ABNORMAL LOW (ref 4.22–5.81)
RDW: 12.8 % (ref 11.5–15.5)
RDW: 12.7 % (ref 11.5–15.5)
WBC: 6.7 10*3/uL (ref 4.0–10.5)
WBC: 6.8 10*3/uL (ref 4.0–10.5)

## 2016-03-25 LAB — BASIC METABOLIC PANEL
ANION GAP: 8 (ref 5–15)
BUN: 8 mg/dL (ref 6–20)
CO2: 23 mmol/L (ref 22–32)
Calcium: 8 mg/dL — ABNORMAL LOW (ref 8.9–10.3)
Chloride: 109 mmol/L (ref 101–111)
Creatinine, Ser: 0.88 mg/dL (ref 0.61–1.24)
GFR calc Af Amer: >60 mL/min (ref >60)
GFR calc non Af Amer: >60 mL/min (ref >60)
GLUCOSE: 103 mg/dL — AB (ref 65–99)
POTASSIUM: 3.6 mmol/L (ref 3.5–5.1)
Sodium: 140 mmol/L (ref 135–145)

## 2016-03-25 LAB — TROPONIN I: TROPONIN I: 0.32 ng/mL — AB (ref <0.03)

## 2016-03-25 LAB — POCT ACTIVATED CLOTTING TIME: ACTIVATED CLOTTING TIME: 527 s

## 2016-03-25 LAB — HEPARIN LEVEL (UNFRACTIONATED): Heparin Unfractionated: 0.67 IU/mL (ref 0.30–0.70)

## 2016-03-25 SURGERY — LEFT HEART CATH AND CORS/GRAFTS ANGIOGRAPHY

## 2016-03-25 MED ORDER — HEPARIN (PORCINE) IN NACL 2-0.9 UNIT/ML-% IJ SOLN
INTRAMUSCULAR | Status: DC | PRN
  Administered 2016-03-25: 2000 mL

## 2016-03-25 MED ORDER — BIVALIRUDIN 250 MG IV SOLR
INTRAVENOUS | Status: AC
  Filled 2016-03-25: qty 250

## 2016-03-25 MED ORDER — MIDAZOLAM HCL 2 MG/2ML IJ SOLN
INTRAMUSCULAR | Status: DC | PRN
  Administered 2016-03-25 (×2): 1 mg via INTRAVENOUS

## 2016-03-25 MED ORDER — IOPAMIDOL (ISOVUE-370) INJECTION 76%
INTRAVENOUS | Status: AC
  Filled 2016-03-25: qty 50

## 2016-03-25 MED ORDER — LIDOCAINE HCL (PF) 1 % IJ SOLN
INTRAMUSCULAR | Status: AC
  Filled 2016-03-25: qty 30

## 2016-03-25 MED ORDER — SODIUM CHLORIDE 0.9 % IV SOLN: INTRAVENOUS | Status: DC

## 2016-03-25 MED ORDER — SODIUM CHLORIDE 0.9 % IV SOLN
INTRAVENOUS | Status: DC | PRN
  Administered 2016-03-25: 15:00:00
  Administered 2016-03-25: 1.75 mg/kg/h via INTRAVENOUS

## 2016-03-25 MED ORDER — VERAPAMIL HCL 2.5 MG/ML IV SOLN
INTRAVENOUS | Status: DC | PRN
  Administered 2016-03-25: 14:00:00 via INTRA_ARTERIAL

## 2016-03-25 MED ORDER — IOPAMIDOL (ISOVUE-370) INJECTION 76%
INTRAVENOUS | Status: AC
  Filled 2016-03-25: qty 125

## 2016-03-25 MED ORDER — HEPARIN SODIUM (PORCINE) 5000 UNIT/ML IJ SOLN
5000.0000 [IU] | Freq: Three times a day (TID) | INTRAMUSCULAR | Status: DC
  Administered 2016-03-26: 07:00:00 5000 [IU] via SUBCUTANEOUS
  Filled 2016-03-25: qty 1

## 2016-03-25 MED ORDER — FENTANYL CITRATE (PF) 100 MCG/2ML IJ SOLN
INTRAMUSCULAR | Status: DC | PRN
  Administered 2016-03-25 (×2): 25 ug via INTRAVENOUS

## 2016-03-25 MED ORDER — SODIUM CHLORIDE 0.9 % WEIGHT BASED INFUSION: 3.0000 mL/kg/h | INTRAVENOUS | Status: AC

## 2016-03-25 MED ORDER — HEPARIN SODIUM (PORCINE) 1000 UNIT/ML IJ SOLN
INTRAMUSCULAR | Status: AC
  Filled 2016-03-25: qty 1

## 2016-03-25 MED ORDER — DOCUSATE SODIUM 100 MG PO CAPS
100.0000 mg | ORAL_CAPSULE | Freq: Two times a day (BID) | ORAL | Status: DC
  Administered 2016-03-25 – 2016-03-26 (×3): 100 mg via ORAL
  Filled 2016-03-25 (×3): qty 1

## 2016-03-25 MED ORDER — SODIUM CHLORIDE 0.9 % IV SOLN: 250.0000 mL | INTRAVENOUS | Status: DC | PRN

## 2016-03-25 MED ORDER — LIDOCAINE HCL (PF) 1 % IJ SOLN: INTRAMUSCULAR | Status: DC | PRN

## 2016-03-25 MED ORDER — MIDAZOLAM HCL 2 MG/2ML IJ SOLN
INTRAMUSCULAR | Status: AC
  Filled 2016-03-25: qty 2

## 2016-03-25 MED ORDER — HEPARIN SODIUM (PORCINE) 1000 UNIT/ML IJ SOLN
INTRAMUSCULAR | Status: DC | PRN
  Administered 2016-03-25: 5000 [IU] via INTRAVENOUS

## 2016-03-25 MED ORDER — VERAPAMIL HCL 2.5 MG/ML IV SOLN
INTRAVENOUS | Status: AC
  Filled 2016-03-25: qty 2

## 2016-03-25 MED ORDER — LIDOCAINE HCL (PF) 1 % IJ SOLN
INTRAMUSCULAR | Status: DC | PRN
  Administered 2016-03-25: 2 mL via INTRADERMAL

## 2016-03-25 MED ORDER — SODIUM CHLORIDE 0.9% FLUSH
3.0000 mL | Freq: Two times a day (BID) | INTRAVENOUS | Status: DC
  Administered 2016-03-26: 3 mL via INTRAVENOUS

## 2016-03-25 MED ORDER — IOPAMIDOL (ISOVUE-370) INJECTION 76%
INTRAVENOUS | Status: AC
  Filled 2016-03-25: qty 100

## 2016-03-25 MED ORDER — FENTANYL CITRATE (PF) 100 MCG/2ML IJ SOLN
INTRAMUSCULAR | Status: AC
Start: 2016-03-25 — End: 2016-03-25
  Filled 2016-03-25: qty 2

## 2016-03-25 MED ORDER — SODIUM CHLORIDE 0.9% FLUSH: 3.0000 mL | INTRAVENOUS | Status: DC | PRN

## 2016-03-25 MED ORDER — IOPAMIDOL (ISOVUE-370) INJECTION 76%
INTRAVENOUS | Status: DC | PRN
  Administered 2016-03-25: 210 mL via INTRA_ARTERIAL

## 2016-03-25 MED ORDER — HEPARIN (PORCINE) IN NACL 2-0.9 UNIT/ML-% IJ SOLN
INTRAMUSCULAR | Status: AC
  Filled 2016-03-25: qty 2000

## 2016-03-25 MED ORDER — BIVALIRUDIN BOLUS VIA INFUSION - CUPID
INTRAVENOUS | Status: DC | PRN
  Administered 2016-03-25: 76.275 mg via INTRAVENOUS

## 2016-03-25 MED ORDER — SODIUM CHLORIDE 0.9% FLUSH: 3.0000 mL | Freq: Two times a day (BID) | INTRAVENOUS | Status: DC

## 2016-03-25 SURGICAL SUPPLY — 18 items
BALLN EMERGE MR 3.0X12 (BALLOONS) ×3
BALLOON EMERGE MR 3.0X12 (BALLOONS) IMPLANT
CATH 5FR JL4 DIAGNOSTIC (CATHETERS) ×2 IMPLANT
CATH IMPULSE 5F ANG/FL3.5 (CATHETERS) ×2 IMPLANT
CATH INFINITI 5 FR IM (CATHETERS) ×2 IMPLANT
DEVICE RAD COMP TR BAND LRG (VASCULAR PRODUCTS) ×2 IMPLANT
DEVICE SPIDERFX EMB PROT 5MM (WIRE) ×2 IMPLANT
GLIDESHEATH SLEND SS 6F .021 (SHEATH) ×2 IMPLANT
GUIDE CATH RUNWAY 6FR AL 1 (CATHETERS) ×2 IMPLANT
KIT ENCORE 26 ADVANTAGE (KITS) ×2 IMPLANT
KIT HEART LEFT (KITS) ×3 IMPLANT
PACK CARDIAC CATHETERIZATION (CUSTOM PROCEDURE TRAY) ×3 IMPLANT
STENT SYNERGY DES 4X12 (Permanent Stent) ×6 IMPLANT
SYR MEDRAD MARK V 150ML (SYRINGE) ×3 IMPLANT
TRANSDUCER W/STOPCOCK (MISCELLANEOUS) ×3 IMPLANT
TUBING CIL FLEX 10 FLL-RA (TUBING) ×3 IMPLANT
WIRE ASAHI PROWATER 180CM (WIRE) ×2 IMPLANT
WIRE SAFE-T 1.5MM-J .035X260CM (WIRE) ×2 IMPLANT

## 2016-03-25 NOTE — Progress Notes (Signed)
Patient going to cath lab. A;ert and oriented x 4, no complaints. Heparin drip D/C per MD order.

## 2016-03-25 NOTE — H&P (View-Only) (Signed)
Patient Name: Carlos Herman Date of Encounter: 03/25/2016  Primary Cardiologist: [facility] Problem List     Principal Problem:   SBO (small bowel obstruction) [facility]) Active Problems:   Coronary artery disease involving native coronary artery   S/P CABG 1996   Acute coronary syndrome (HCC)   Elevated troponin - consider NSTEMI   Essential hypertension   Pancreatic lesion    Subjective   No further CP since yesterday.  No Sob.   Minimal flatus, No BM  Inpatient Medications    . amLODipine  10 mg Oral Daily  . aspirin EC  81 mg Oral Daily  . clopidogrel  75 mg Oral Q breakfast  . docusate sodium  100 mg Oral BID  . insulin aspart  0-9 Units Subcutaneous Q4H  . isosorbide mononitrate  60 mg Oral QHS  . metoprolol succinate  100 mg Oral Daily  . metoprolol succinate  50 mg Oral QHS    Vital Signs    Vitals:   03/24/16 0314 03/24/16 0411 03/24/16 1400 03/25/16 0529  BP:  (!) 164/68 127/82 (!) 104/44  Pulse:  67 71 62  Resp:  20  18  Temp:  97.8 F (36.6 C) 97.7 F (36.5 C) 98.3 F (36.8 C)  TempSrc:  Oral Oral   SpO2:  94% 96% 96%  Weight: 101.7 kg (224 lb 1.6 oz)     Height: 5\' 9"  (1.753 m)       Intake/Output Summary (Last 24 hours) at 03/25/16 1130 Last data filed at 03/25/16 1124  Gross per 24 hour  Intake          3693.33 ml  Output              350 ml  Net          3343.33 ml   Filed Weights   03/24/16 0314  Weight: 101.7 kg (224 lb 1.6 oz)    Physical Exam   GEN: Well nourished, well developed, in no acute distress.  HEENT: Grossly normal.  Neck: Supple, no JVD, carotid bruits, or masses. Cardiac: RRR, no murmurs, rubs, or gallops. No clubbing, cyanosis, edema.  Radials/DP/PT 2+ and equal bilaterally.  Respiratory:  Respirations regular and unlabored, clear to auscultation bilaterally. GI: Soft, nontender, nondistended, minimal if any bowel sounds MS: no deformity or atrophy. Skin: warm and dry, no rash. Neuro:  Strength and  sensation are intact. Psych: AAOx3.  Normal affect.  Labs    CBC  Recent Labs  03/23/16 2149 03/25/16 0531  WBC 14.5* 6.7  NEUTROABS 11.8*  --   HGB 14.9 11.9*  HCT 44.8 36.4*  MCV 95.5 96.8  PLT 358 Q000111Q   Basic Metabolic Panel  Recent Labs  03/23/16 2149 03/25/16 0531  NA 134* 140  K 4.5 3.6  CL 100* 109  CO2 21* 23  GLUCOSE 190* 103*  BUN 12 8  CREATININE 1.02 0.88  CALCIUM 9.4 8.0*   Liver Function Tests  Recent Labs  03/23/16 2149  AST 20  ALT 14*  ALKPHOS 78  BILITOT 0.8  PROT 7.1  ALBUMIN 4.1   No results for input(s): LIPASE, AMYLASE in the last 72 hours. Cardiac Enzymes  Recent Labs  03/24/16 1339 03/24/16 1938 03/25/16 0531  TROPONINI 0.17* 0.35* 0.32*   BNP Invalid input(s): POCBNP D-Dimer No results for input(s): DDIMER in the last 72 hours. Hemoglobin A1C No results for input(s): HGBA1C in the last 72 hours. Fasting Lipid Panel  Recent Labs  03/25/16 0531  CHOL 120  HDL 24*  LDLCALC 64  TRIG 158*  CHOLHDL 5.0   Thyroid Function Tests No results for input(s): TSH, T4TOTAL, T3FREE, THYROIDAB in the last 72 hours.  Invalid input(s): FREET3  Telemetry    NSR  ECG    N/a  Radiology    n/a  Patient Profile     75 yo male with PMH of of CAD s/p CABG in 1996 with subsequent stenting (09, 15), DM, HTN, HLD who presented to the [facility] ED with reports of abdominal pain on 9/24 Found to have likely small bowel ileus. Had a prolonged episode of chest pain yesterday concerning for ACS now with positive troponin levels.  Assessment & Plan    Principal Problem:   SBO (small bowel obstruction) ([place]) - Remains nothing by mouth with potentially advancing to a clear liquid diet today. Still no BM and minimal flatus. No real bowel sounds Active Problems:   Acute coronary syndrome (HCC) /  Elevated troponin - consider NSTEMI vs. demand ischemia Coronary artery disease involving native coronary artery / S/P CABG 1996    With troponin levels that doubled from initial & dynamic ECG changes, I'm concerned this could be a more ACS non-STEMI as opposed to demand ischemia.  Heparin started last night.  Remains on full anti-ischemic regimen including aspirin, Plavix, beta blocker, Imdur and amlodipine -- Imdur dose was increased to 60 mg.  In order to clarify if this is truly ACS versus demand ischemia with existing CAD, I think the best option is to proceed with invasive evaluation with cardiac catheterization today.  I have discussed the risks, benefits, alternatives and indications the patient in detail. Please see addendum below.    Essential hypertension - blood pressure well controlled on amlodipine, metoprolol, Imdur   Procedure:  Left Heart Catheterization with Native Coronary and Graft Angiography +/- PCI  The procedure with Risks/Benefits/Alternatives and Indications was reviewed with the patient & family.  All questions were answered.    Risks / Complications include, but not limited to: Death, MI, CVA/TIA, VF/VT (with defibrillation), Bradycardia (need for temporary pacer placement), contrast induced nephropathy, bleeding / bruising / hematoma / pseudoaneurysm, vascular or coronary injury (with possible emergent CT or Vascular Surgery), adverse medication reactions, infection.  Additional risks involving the use of radiation with the possibility of radiation burns and cancer were explained in detail.  The patient (and family) voice understanding and agree to proceed.       Signed, Glenetta Hew, M.D., M.S. Interventional Cardiologist   Pager # [phone] Phone # [phone] [address] [place], [place] [address]  03/25/2016, 11:30 AM

## 2016-03-25 NOTE — Progress Notes (Signed)
ANTICOAGULATION CONSULT NOTE - Follow-up Consult  Pharmacy Consult for Heparin Indication: chest pain/ACS  Allergies  Allergen Reactions  . Penicillins Swelling and Other (See Comments)    Redness Has patient had a PCN reaction causing immediate rash, facial/tongue/throat swelling, SOB or lightheadedness with hypotension: No Has patient had a PCN reaction causing severe rash involving mucus membranes or skin necrosis: No Has patient had a PCN reaction that required hospitalization No Has patient had a PCN reaction occurring within the last 10 years: Non If all of the above answers are "NO", then may proceed with Cephalosporin use.   . Statins Other (See Comments)      muscle pain    Patient Measurements: Height: 5\' 9"  (175.3 cm) Weight: 224 lb 1.6 oz (101.7 kg) IBW/kg (Calculated) : 70.7 Heparin Dosing Weight:  94  Vital Signs: Temp: 98.3 F (36.8 C) (09/26 0529) BP: 104/44 (09/26 0529) Pulse Rate: 62 (09/26 0529)  Labs:  Recent Labs  03/23/16 2149 03/24/16 0827 03/24/16 1339 03/24/16 1938 03/25/16 0531  HGB 14.9  --   --   --  11.9*  HCT 44.8  --   --   --  36.4*  PLT 358  --   --   --  269  HEPARINUNFRC  --   --   --   --  0.67  CREATININE 1.02  --   --   --   --   TROPONINI  --  <0.03 0.17* 0.35*  --     Estimated Creatinine Clearance: 74.7 mL/min (by C-G formula based on SCr of 1.02 mg/dL).   Assessment: 75 year old male on heparin for r/o ACS. Heparin level therapeutic on 1400 units/hr. Hgb down to 11.9. No bleeding noted.  Goal of Therapy:  Heparin level 0.3-0.7 units/ml Monitor platelets by anticoagulation protocol: Yes   Plan:  Continue heparin drip at 1400 units / hr F/u 6 hr confirmatoryl heparin level  Sherlon Handing, PharmD, BCPS Clinical pharmacist, pager [phone] 03/25/2016,6:23 AM

## 2016-03-25 NOTE — Interval H&P Note (Signed)
History and Physical Interval Note:  03/25/2016 1:28 PM  Carlos Herman  has presented today for surgery, with the diagnosis of cp  The various methods of treatment have been discussed with the patient and family. After consideration of risks, benefits and other options for treatment, the patient has consented to  Procedure(s): Left Heart Cath and Cors/Grafts Angiography (N/A) as a surgical intervention .  The patient's history has been reviewed, patient examined, no change in status, stable for surgery.  I have reviewed the patient's chart and labs.  Questions were answered to the patient's satisfaction.    Cath Lab Visit (complete for each Cath Lab visit)  Clinical Evaluation Leading to the Procedure:   ACS: Yes.    Non-ACS:    Anginal Classification: CCS I  Anti-ischemic medical therapy: Maximal Therapy (2 or more classes of medications)  Non-Invasive Test Results: No non-invasive testing performed  Prior CABG: Previous CABG       Carlos Herman [facility] 03/25/2016 1:28 PM

## 2016-03-25 NOTE — Progress Notes (Signed)
PROGRESS NOTE    Carlos Herman  [id number] DOB: 05-23-1941 DOA: 03/23/2016 PCP: Kandice Hams, MD   Outpatient Specialists:     Brief Narrative:  DENIM HOAGE is a 75 y.o. male with medical history significant of umbilical hernia repair.  Patient presents to the ED with 1 day history of nausea.  Woke up in the morning not feeling well.  Had 1 episode of vomiting after lunchtime.  Persistent nausea since that time.  Does take chronic pain meds and tends to be constipated.  Small BM yesterday but none today.  In ER found to have early SBO vs enteritis with ileus on CT scan. Patient became very upset with nursing and then developed chest pain.  CE positive and cardiology consulted   Assessment & Plan:   Principal Problem:   SBO (small bowel obstruction) (HCC) Active Problems:   Essential hypertension   Coronary artery disease involving native coronary artery   S/P CABG 1996   Pancreatic lesion   Acute coronary syndrome (HCC)   SBO - vs enteritis with ileus - 1. +flatus 2. Clear diet- advance as tolerated 3. Mobilize patient 4. zofran prn  2. Pancreatic lesion - 1. Needs follow up MRI non-emergently 2. D/W patient, Patient would prefer to have this done as outpatient in an open MRI (we dont have open MRI here at [facility]).   3.  Chest pain   1.  CE +   2. Long cardiac history   3. Cardiology consulted-- ASA/plavex/heparin gtt    DVT prophylaxis:  heparin gtt  Code Status: Full Code  Family Communication: patient  Disposition Plan:     Consultants:   cardiology      Subjective: +flatus  Objective: Vitals:   03/24/16 0314 03/24/16 0411 03/24/16 1400 03/25/16 0529  BP:  (!) 164/68 127/82 (!) 104/44  Pulse:  67 71 62  Resp:  20  18  Temp:  97.8 F (36.6 C) 97.7 F (36.5 C) 98.3 F (36.8 C)  TempSrc:  Oral Oral   SpO2:  94% 96% 96%  Weight: 101.7 kg (224 lb 1.6 oz)     Height: 5\' 9"  (1.753 m)       Intake/Output Summary (Last 24  hours) at 03/25/16 1042 Last data filed at 03/25/16 0953  Gross per 24 hour  Intake          3143.33 ml  Output              350 ml  Net          2793.33 ml   Filed Weights   03/24/16 0314  Weight: 101.7 kg (224 lb 1.6 oz)    Examination:  General exam: Appears calm and comfortable- very irritable Respiratory system: Clear to auscultation. Respiratory effort normal. Cardiovascular system: S1 & S2 heard, RRR. No JVD, murmurs, rubs, gallops or clicks. No pedal edema. Gastrointestinal system: Abdomen is  Mildly distended, soft and nontender. No organomegaly or masses felt.  bowel sounds heard. Central nervous system: Alert and oriented. No focal neurological deficits.     Data Reviewed: I have personally reviewed following labs and imaging studies  CBC:  Recent Labs Lab 03/23/16 2149 03/25/16 0531  WBC 14.5* 6.7  NEUTROABS 11.8*  --   HGB 14.9 11.9*  HCT 44.8 36.4*  MCV 95.5 96.8  PLT 358 Q000111Q   Basic Metabolic Panel:  Recent Labs Lab 03/23/16 2149 03/25/16 0531  NA 134* 140  K 4.5 3.6  CL 100* 109  CO2 21* 23  GLUCOSE 190* 103*  BUN 12 8  CREATININE 1.02 0.88  CALCIUM 9.4 8.0*   GFR: Estimated Creatinine Clearance: 86.6 mL/min (by C-G formula based on SCr of 0.88 mg/dL). Liver Function Tests:  Recent Labs Lab 03/23/16 2149  AST 20  ALT 14*  ALKPHOS 78  BILITOT 0.8  PROT 7.1  ALBUMIN 4.1   No results for input(s): LIPASE, AMYLASE in the last 168 hours. No results for input(s): AMMONIA in the last 168 hours. Coagulation Profile: No results for input(s): INR, PROTIME in the last 168 hours. Cardiac Enzymes:  Recent Labs Lab 03/24/16 0827 03/24/16 1339 03/24/16 1938 03/25/16 0531  TROPONINI <0.03 0.17* 0.35* 0.32*   BNP (last 3 results) No results for input(s): PROBNP in the last 8760 hours. HbA1C: No results for input(s): HGBA1C in the last 72 hours. CBG:  Recent Labs Lab 03/24/16 1742 03/24/16 2050 03/25/16 0402 03/25/16 0820    GLUCAP 137* 123* 110* 108*   Lipid Profile:  Recent Labs  03/25/16 0531  CHOL 120  HDL 24*  LDLCALC 64  TRIG 158*  CHOLHDL 5.0   Thyroid Function Tests: No results for input(s): TSH, T4TOTAL, FREET4, T3FREE, THYROIDAB in the last 72 hours. Anemia Panel: No results for input(s): VITAMINB12, FOLATE, FERRITIN, TIBC, IRON, RETICCTPCT in the last 72 hours. Urine analysis:    Component Value Date/Time   COLORURINE YELLOW 03/23/2016 0037   APPEARANCEUR CLEAR 03/23/2016 0037   LABSPEC 1.020 03/23/2016 0037   PHURINE 5.5 03/23/2016 0037   GLUCOSEU NEGATIVE 03/23/2016 0037   HGBUR NEGATIVE 03/23/2016 0037   BILIRUBINUR NEGATIVE 03/23/2016 0037   KETONESUR 15 (A) 03/23/2016 0037   PROTEINUR NEGATIVE 03/23/2016 0037   UROBILINOGEN 0.2 02/11/2010 0800   NITRITE NEGATIVE 03/23/2016 0037   LEUKOCYTESUR NEGATIVE 03/23/2016 0037     )No results found for this or any previous visit (from the past 240 hour(s)).    Anti-infectives    None       Radiology Studies: Ct Abdomen Pelvis W Contrast  Result Date: 03/24/2016 CLINICAL DATA:  75 year old male with abdominal pain. EXAM: CT ABDOMEN AND PELVIS WITH CONTRAST TECHNIQUE: Multidetector CT imaging of the abdomen and pelvis was performed using the standard protocol following bolus administration of intravenous contrast. CONTRAST:  165mL ISOVUE-300 IOPAMIDOL (ISOVUE-300) INJECTION 61% COMPARISON:  Abdominal radiograph dated 03/23/2016 and CT dated 04/27/2014 FINDINGS: Lower chest: Right lung base subsegmental atelectasis versus pneumonia. There are bilateral lower lobe pulmonary artery emboli which appear linear in morphology and similar to the PE seen on the chest CT dated 04/10/2014. There is coronary vascular calcification and CABG surgery. No intra-abdominal free air or free fluid. Hepatobiliary: Small stones within the gallbladder fundus. No pericholecystic fluid. Probable mild fatty infiltration of the liver. No biliary ductal  dilatation. Pancreas: There multiple hypodense lesions in the pancreas measuring up to 1.5 cm in the proximal body of the pancreas. These lesions are grossly similar to the lesion seen on the prior CT of 2015 further evaluation with MRI without and with contrast, if not performed, recommended. There is no dilatation of the main pancreatic duct. No active pancreatic inflammatory changes. Spleen: Normal in size without focal abnormality. Adrenals/Urinary Tract: The adrenal glands appear unremarkable. There is mild bilateral renal atrophy. Multiple right renal calculi noted. There is a 1 cm stone in the right renal pelvis with mild right hydronephrosis. Multiple smaller right renal inferior pole calculi noted. There is no hydronephrosis or nephrolithiasis on the left. The 1.5 cm left  renal inferior pole hypodense lesion appears similar to prior study but incompletely characterized on this CT. This most likely represents a cyst. Further evaluation with nonemergent ultrasound recommended. The visualized ureters and urinary bladder appear unremarkable. Stomach/Bowel: There is sigmoid diverticulosis and scattered colonic diverticula without active inflammatory changes. Multiple mildly dilated loops of small bowel in the mid to lower abdomen noted. There is segmental thickening of the small bowel in the lower abdomen and right hemipelvis likely representing enteritis with associated ileus or low-grade/ with transition zone in the right hemipelvis. The distal small bowel and terminal ileum are decompressed. Normal appendix. Vascular/Lymphatic: There is moderate aortoiliac atherosclerotic disease. There is heavy atherosclerotic calcification of the origins of the celiac axis, SMA, and IMA. The origins of the these vessels however remain patent. No portal venous gas identified. There is no adenopathy. Reproductive: The prostate and seminal vesicles are grossly unremarkable. 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 Multilevel degenerative changes of the spine with osteophyte formation. L4-L5 spinous process fixation hardware. No acute fracture. Postsurgical changes of the lower lumbar spine. There is laminectomy at L2. A 3.5 x 1.7 cm fluid collection in the posterior paraspinal soft tissues in the midline at this level noted. IMPRESSION: Findings likely represent enteritis with segmental ileus versus an early small-bowel obstruction with transition zone in the right hemipelvis. Clinical correlation and follow-up recommended. Cholelithiasis. Right renal calculi with mild right hydronephrosis. Stable appearing hypodense pancreatic lesions. Further evaluation with nonemergent MRI without and with contrast recommended. Electronically Signed   By: Anner Crete M.D.   On: 03/24/2016 01:38   Dg Abd Acute W/chest  Result Date: 03/23/2016 CLINICAL DATA:  Acute onset of nausea and vomiting. Initial encounter. EXAM: DG ABDOMEN ACUTE W/ 1V CHEST COMPARISON:  Chest radiograph performed 01/29/2016, and CT of the abdomen and pelvis from 04/27/2014 FINDINGS: The lungs are well-aerated. There is mild elevation of the right hemidiaphragm. There is no evidence of focal opacification, pleural effusion or pneumothorax. The cardiomediastinal silhouette is mildly enlarged. The patient is status post median sternotomy, with evidence of prior CABG. There is distention of small-bowel loops to 4.7 cm in maximal diameter, with suggestion of underlying diffuse small bowel wall thickening. This may reflect underlying infectious or inflammatory ileitis. Air is still seen within the colon, without definite evidence for bowel obstruction. Would correlate with the patient's symptoms. No acute osseous abnormalities are seen; the sacroiliac joints are unremarkable in appearance. The patient's right hip arthroplasty is grossly unremarkable in appearance, though incompletely imaged.  Lumbar spinal fusion hardware is also seen. IMPRESSION: 1. Distention of small-bowel loops to 4.7 cm in maximal diameter, with suggestion of underlying diffuse bowel wall thickening. This may reflect underlying infectious or inflammatory ileitis. The appearance is less typical for bowel obstruction. Would correlate with the patient's symptoms. 2. Mild elevation of the right hemidiaphragm. Mild cardiomegaly. Lungs remain grossly clear. Electronically Signed   By: Garald Balding M.D.   On: 03/23/2016 22:58        Scheduled Meds: . amLODipine  10 mg Oral Daily  . aspirin EC  81 mg Oral Daily  . clopidogrel  75 mg Oral Q breakfast  . insulin aspart  0-9 Units Subcutaneous Q4H  . isosorbide mononitrate  60 mg Oral QHS  . metoprolol succinate  100 mg Oral Daily  . metoprolol succinate  50 mg Oral QHS   Continuous Infusions: . sodium chloride 1,000 mL (03/25/16 0953)  .  heparin 1,400 Units/hr (03/25/16 0818)     LOS: 1 day    Time spent: 25 min    [place]ne, DO [facility] Pager [phone]  If 7PM-7AM, please contact night-coverage [email or web address] Password [email or web address] 03/25/2016, 10:42 AM

## 2016-03-25 NOTE — Progress Notes (Signed)
Patients b/p 104/44.  Paged md.

## 2016-03-25 NOTE — Progress Notes (Signed)
Patient Name: Carlos Herman Date of Encounter: 03/25/2016  Primary Cardiologist: [facility] Problem List     Principal Problem:   SBO (small bowel obstruction) [facility]) Active Problems:   Coronary artery disease involving native coronary artery   S/P CABG 1996   Acute coronary syndrome (HCC)   Elevated troponin - consider NSTEMI   Essential hypertension   Pancreatic lesion    Subjective   No further CP since yesterday.  No Sob.   Minimal flatus, No BM  Inpatient Medications    . amLODipine  10 mg Oral Daily  . aspirin EC  81 mg Oral Daily  . clopidogrel  75 mg Oral Q breakfast  . docusate sodium  100 mg Oral BID  . insulin aspart  0-9 Units Subcutaneous Q4H  . isosorbide mononitrate  60 mg Oral QHS  . metoprolol succinate  100 mg Oral Daily  . metoprolol succinate  50 mg Oral QHS    Vital Signs    Vitals:   03/24/16 0314 03/24/16 0411 03/24/16 1400 03/25/16 0529  BP:  (!) 164/68 127/82 (!) 104/44  Pulse:  67 71 62  Resp:  20  18  Temp:  97.8 F (36.6 C) 97.7 F (36.5 C) 98.3 F (36.8 C)  TempSrc:  Oral Oral   SpO2:  94% 96% 96%  Weight: 101.7 kg (224 lb 1.6 oz)     Height: 5\' 9"  (1.753 m)       Intake/Output Summary (Last 24 hours) at 03/25/16 1130 Last data filed at 03/25/16 1124  Gross per 24 hour  Intake          3693.33 ml  Output              350 ml  Net          3343.33 ml   Filed Weights   03/24/16 0314  Weight: 101.7 kg (224 lb 1.6 oz)    Physical Exam   GEN: Well nourished, well developed, in no acute distress.  HEENT: Grossly normal.  Neck: Supple, no JVD, carotid bruits, or masses. Cardiac: RRR, no murmurs, rubs, or gallops. No clubbing, cyanosis, edema.  Radials/DP/PT 2+ and equal bilaterally.  Respiratory:  Respirations regular and unlabored, clear to auscultation bilaterally. GI: Soft, nontender, nondistended, minimal if any bowel sounds MS: no deformity or atrophy. Skin: warm and dry, no rash. Neuro:  Strength and  sensation are intact. Psych: AAOx3.  Normal affect.  Labs    CBC  Recent Labs  03/23/16 2149 03/25/16 0531  WBC 14.5* 6.7  NEUTROABS 11.8*  --   HGB 14.9 11.9*  HCT 44.8 36.4*  MCV 95.5 96.8  PLT 358 [id number]   Basic Metabolic Panel  Recent Labs  03/23/16 2149 03/25/16 0531  NA 134* 140  K 4.5 3.6  CL 100* 109  CO2 21* 23  GLUCOSE 190* 103*  BUN 12 8  CREATININE 1.02 0.88  CALCIUM 9.4 8.0*   Liver Function Tests  Recent Labs  03/23/16 2149  AST 20  ALT 14*  ALKPHOS 78  BILITOT 0.8  PROT 7.1  ALBUMIN 4.1   No results for input(s): LIPASE, AMYLASE in the last 72 hours. Cardiac Enzymes  Recent Labs  03/24/16 1339 03/24/16 1938 03/25/16 0531  TROPONINI 0.17* 0.35* 0.32*   BNP Invalid input(s): POCBNP D-Dimer No results for input(s): DDIMER in the last 72 hours. Hemoglobin A1C No results for input(s): HGBA1C in the last 72 hours. Fasting Lipid Panel  Recent Labs  03/25/16 0531  CHOL 120  HDL 24*  LDLCALC 64  TRIG 158*  CHOLHDL 5.0   Thyroid Function Tests No results for input(s): TSH, T4TOTAL, T3FREE, THYROIDAB in the last 72 hours.  Invalid input(s): FREET3  Telemetry    NSR  ECG    N/a  Radiology    n/a  Patient Profile     75 yo male with PMH of of CAD s/p CABG in 1996 with subsequent stenting (09, 15), DM, HTN, HLD who presented to the [facility] ED with reports of abdominal pain on 9/24 Found to have likely small bowel ileus. Had a prolonged episode of chest pain yesterday concerning for ACS now with positive troponin levels.  Assessment & Plan    Principal Problem:   SBO (small bowel obstruction) ([place]) - Remains nothing by mouth with potentially advancing to a clear liquid diet today. Still no BM and minimal flatus. No real bowel sounds Active Problems:   Acute coronary syndrome (HCC) /  Elevated troponin - consider NSTEMI vs. demand ischemia Coronary artery disease involving native coronary artery / S/P CABG 1996    With troponin levels that doubled from initial & dynamic ECG changes, I'm concerned this could be a more ACS non-STEMI as opposed to demand ischemia.  Heparin started last night.  Remains on full anti-ischemic regimen including aspirin, Plavix, beta blocker, Imdur and amlodipine -- Imdur dose was increased to 60 mg.  In order to clarify if this is truly ACS versus demand ischemia with existing CAD, I think the best option is to proceed with invasive evaluation with cardiac catheterization today.  I have discussed the risks, benefits, alternatives and indications the patient in detail. Please see addendum below.    Essential hypertension - blood pressure well controlled on amlodipine, metoprolol, Imdur   Procedure:  Left Heart Catheterization with Native Coronary and Graft Angiography +/- PCI  The procedure with Risks/Benefits/Alternatives and Indications was reviewed with the patient & family.  All questions were answered.    Risks / Complications include, but not limited to: Death, MI, CVA/TIA, VF/VT (with defibrillation), Bradycardia (need for temporary pacer placement), contrast induced nephropathy, bleeding / bruising / hematoma / pseudoaneurysm, vascular or coronary injury (with possible emergent CT or Vascular Surgery), adverse medication reactions, infection.  Additional risks involving the use of radiation with the possibility of radiation burns and cancer were explained in detail.  The patient (and family) voice understanding and agree to proceed.       Signed, Glenetta Hew, M.D., M.S. Interventional Cardiologist   Pager # [phone] Phone # [phone] [address] [place], [place] [address]  03/25/2016, 11:30 AM

## 2016-03-26 ENCOUNTER — Encounter (HOSPITAL_COMMUNITY): Payer: Self-pay | Admitting: Cardiology

## 2016-03-26 DIAGNOSIS — I2510 Atherosclerotic heart disease of native coronary artery without angina pectoris: Secondary | ICD-10-CM

## 2016-03-26 DIAGNOSIS — I214 Non-ST elevation (NSTEMI) myocardial infarction: Secondary | ICD-10-CM

## 2016-03-26 LAB — GLUCOSE, CAPILLARY
GLUCOSE-CAPILLARY: 158 mg/dL — AB (ref 65–99)
GLUCOSE-CAPILLARY: 146 mg/dL — AB (ref 65–99)
Glucose-Capillary: 122 mg/dL — ABNORMAL HIGH (ref 65–99)

## 2016-03-26 LAB — CBC
HCT: 36.5 % — ABNORMAL LOW (ref 39.0–52.0)
HEMOGLOBIN: 11.8 g/dL — AB (ref 13.0–17.0)
MCH: 31.5 pg (ref 26.0–34.0)
MCHC: 32.3 g/dL (ref 30.0–36.0)
MCV: 97.3 fL (ref 78.0–100.0)
PLATELETS: 273 10*3/uL (ref 150–400)
RBC: 3.75 MIL/uL — ABNORMAL LOW (ref 4.22–5.81)
RDW: 12.7 % (ref 11.5–15.5)
WBC: 8 10*3/uL (ref 4.0–10.5)

## 2016-03-26 LAB — COMPREHENSIVE METABOLIC PANEL
ALK PHOS: 56 U/L (ref 38–126)
ALT: 11 U/L — AB (ref 17–63)
AST: 16 U/L (ref 15–41)
Albumin: 2.9 g/dL — ABNORMAL LOW (ref 3.5–5.0)
Anion gap: 8 (ref 5–15)
BUN: 6 mg/dL (ref 6–20)
CALCIUM: 8.3 mg/dL — AB (ref 8.9–10.3)
CO2: 25 mmol/L (ref 22–32)
CREATININE: 0.94 mg/dL (ref 0.61–1.24)
Chloride: 104 mmol/L (ref 101–111)
Glucose, Bld: 126 mg/dL — ABNORMAL HIGH (ref 65–99)
Potassium: 3.8 mmol/L (ref 3.5–5.1)
SODIUM: 137 mmol/L (ref 135–145)
TOTAL PROTEIN: 5.2 g/dL — AB (ref 6.5–8.1)
Total Bilirubin: 0.7 mg/dL (ref 0.3–1.2)

## 2016-03-26 LAB — LIPASE, BLOOD: Lipase: 17 U/L (ref 11–51)

## 2016-03-26 MED ORDER — METFORMIN HCL 500 MG PO TABS: 1000.0000 mg | ORAL_TABLET | Freq: Two times a day (BID) | ORAL | Status: AC

## 2016-03-26 MED ORDER — POLYETHYLENE GLYCOL 3350 17 G PO PACK
17.0000 g | PACK | Freq: Two times a day (BID) | ORAL | Status: DC
  Administered 2016-03-26: 17 g via ORAL
  Filled 2016-03-26: qty 1

## 2016-03-26 MED ORDER — DOCUSATE SODIUM 100 MG PO CAPS: 100.0000 mg | ORAL_CAPSULE | Freq: Two times a day (BID) | ORAL | 0 refills | Status: DC

## 2016-03-26 MED ORDER — BISACODYL 10 MG RE SUPP
10.0000 mg | Freq: Once | RECTAL | Status: AC
  Administered 2016-03-26: 09:00:00 10 mg via RECTAL
  Filled 2016-03-26: qty 1

## 2016-03-26 MED ORDER — POLYETHYLENE GLYCOL 3350 17 G PO PACK
17.0000 g | PACK | Freq: Once | ORAL | Status: AC
  Administered 2016-03-26: 17 g via ORAL
  Filled 2016-03-26: qty 1

## 2016-03-26 MED ORDER — ANGIOPLASTY BOOK
Freq: Once | Status: DC
  Filled 2016-03-26: qty 1

## 2016-03-26 MED ORDER — INSULIN ASPART 100 UNIT/ML ~~LOC~~ SOLN
0.0000 [IU] | Freq: Three times a day (TID) | SUBCUTANEOUS | Status: DC
  Administered 2016-03-26: 13:00:00 2 [IU] via SUBCUTANEOUS
  Administered 2016-03-26 (×2): 1 [IU] via SUBCUTANEOUS

## 2016-03-26 NOTE — Progress Notes (Signed)
Patient Name: Carlos Herman Date of Encounter: 03/26/2016  Primary Cardiologist: [facility] Problem List     Principal Problem:   SBO (small bowel obstruction) [facility]) Active Problems:   Coronary artery disease involving native coronary artery   S/P CABG 1996   NSTEMI (non-ST elevated myocardial infarction) ([place])   Essential hypertension   Pancreatic lesion    Subjective   Ambulating in hallway this AM -- No further CP.Marland Kitchen Happy about cath findings. No Sob.   More flatus than yesterday, No BM   Inpatient Medications    . amLODipine  10 mg Oral Daily  . angioplasty book   Does not apply Once  . aspirin EC  81 mg Oral Daily  . clopidogrel  75 mg Oral Q breakfast  . docusate sodium  100 mg Oral BID  . heparin  5,000 Units Subcutaneous Q8H  . insulin aspart  0-9 Units Subcutaneous TID WC  . isosorbide mononitrate  60 mg Oral QHS  . metoprolol succinate  100 mg Oral Daily  . metoprolol succinate  50 mg Oral QHS  . polyethylene glycol  17 g Oral BID  . sodium chloride flush  3 mL Intravenous Q12H    Vital Signs    Vitals:   03/25/16 2330 03/26/16 0340 03/26/16 0640 03/26/16 0737  BP: (!) 144/65 (!) 134/56 (!) 133/50 (!) 115/40  Pulse: 62 61 63 71  Resp: 18 17 17  (!) 24  Temp:  97.8 F (36.6 C)  97.8 F (36.6 C)  TempSrc:  Oral  Oral  SpO2: 95% 92% 97% 96%  Weight:  100 kg (220 lb 7.4 oz)    Height:        Intake/Output Summary (Last 24 hours) at 03/26/16 0916 Last data filed at 03/26/16 0130  Gross per 24 hour  Intake          1815.13 ml  Output              900 ml  Net           915.13 ml   Filed Weights   03/24/16 0314 03/26/16 0340  Weight: 101.7 kg (224 lb 1.6 oz) 100 kg (220 lb 7.4 oz)    Physical Exam   GEN: Well nourished, well developed, in no acute distress.  HEENT: Grossly normal.  Neck: Supple, no JVD, carotid bruits, or masses. Cardiac: RRR, no murmurs, rubs, or gallops. No clubbing, cyanosis, edema.  Radials/DP/PT 2+ and equal  bilaterally.  Respiratory:  Respirations regular and unlabored, clear to auscultation bilaterally. GI: Soft, nontender, nondistended, minimal if any bowel sounds MS: no deformity or atrophy. Skin: warm and dry, no rash. Neuro:  Strength and sensation are intact. Psych: AAOx3.  Normal affect.  Labs    CBC  Recent Labs  03/23/16 2149  03/25/16 1723 03/26/16 0355  WBC 14.5*  < > 6.8 8.0  NEUTROABS 11.8*  --   --   --   HGB 14.9  < > 13.0 11.8*  HCT 44.8  < > 41.3 36.5*  MCV 95.5  < > 99.5 97.3  PLT 358  < > 260 273  < > = values in this interval not displayed. Basic Metabolic Panel  Recent Labs  03/25/16 0531 03/26/16 0355  NA 140 137  K 3.6 3.8  CL 109 104  CO2 23 25  GLUCOSE 103* 126*  BUN 8 6  CREATININE 0.88 0.94  CALCIUM 8.0* 8.3*   Liver Function Tests  Recent Labs  03/23/16 2149 03/26/16 0355  AST 20 16  ALT 14* 11*  ALKPHOS 78 56  BILITOT 0.8 0.7  PROT 7.1 5.2*  ALBUMIN 4.1 2.9*    Recent Labs  03/26/16 0355  LIPASE 17   Cardiac Enzymes  Recent Labs  03/24/16 1339 03/24/16 1938 03/25/16 0531  TROPONINI 0.17* 0.35* 0.32*   BNP Invalid input(s): POCBNP D-Dimer No results for input(s): DDIMER in the last 72 hours. Hemoglobin A1C No results for input(s): HGBA1C in the last 72 hours. Fasting Lipid Panel  Recent Labs  03/25/16 0531  CHOL 120  HDL 24*  LDLCALC 64  TRIG 158*  CHOLHDL 5.0   Thyroid Function Tests No results for input(s): TSH, T4TOTAL, T3FREE, THYROIDAB in the last 72 hours.  Invalid input(s): FREET3  Telemetry    NSR  ECG    N/a  Cardiology    Cardiac Cath Coronary Diagrams   Diagnostic Diagram      Post Intervention Diagram         PCI to prox SVG-OM1-OM2: 4.0 x 12 mm Synergy DES  PCI to distal limb of SVG-OM1-OM2: 4.0 x 12 mm Synergy DES  Radiology    n/a  Patient Profile     75 yo male with PMH of of CAD s/p CABG in 1996 with subsequent stenting (09, 15), DM, HTN, HLD who presented to the  [facility] ED with reports of abdominal pain on 9/24 Found to have likely small bowel ileus. Had a prolonged episode of chest pain yesterday concerning for ACS now with positive troponin levels.  Assessment & Plan       SBO (small bowel obstruction) ([place]) - Remains nothing by mouth with potentially advancing to a clear liquid diet today. Still no BM and minimal flatus. No real bowel sounds    Acute coronary syndrome (HCC) /  Elevated troponin - consider NSTEMI vs. demand ischemia Coronary artery disease involving native coronary artery / S/P CABG 1996  Cath yesterday revealed new findings = Patent LIMA-LAD with subtotally occluded SVG-Diag (that previously retrograde filled the LAD with grafted Diag occluded), RCA stents are patent with stable rPDA disease, but SVG-OM1-2 was actually patent with 2 lesions  == treated with DES x 2  Remains on full anti-ischemic regimen including aspirin, Plavix, beta blocker, Imdur and amlodipine -- Imdur dose was increased to 60 mg.  Had previously been on ACE-I,but this was d/c'd by Dr. Johnsie Cancel  Was not on statin per home-med list - will defer to primary cardiologist  At this point, he seems stable from a CAD/Cardiac standpoint - just awaiting return of bowel fxn. Did have flatus & noted some stool with wiping.  We will check on him in AM, but will likely sign off.   Signed, Glenetta Hew, M.D., M.S. Interventional Cardiologist   Pager # [phone] Phone # [phone] [address] [place], [place] [address]  03/26/2016, 9:16 AM

## 2016-03-26 NOTE — Progress Notes (Signed)
Patient educated on discharge instructions and the need to call MDs for follow up appointments and states that he "Does not need to call his primary Dr for an appointment because he already has an appointment scheduled for Friday." Patient states that his appointment has nothing to do with this hospitalization and patient was informed that his primary Dr needs to be aware of this hospitalization and to make sure he calls to make sure that this appointment already scheduled will also cover his follow up appointment needed from this hospitalization.  Patient states "I'll tell him that I was here when I get there." Patient's home medications have been returned to patient.

## 2016-03-26 NOTE — Care Management Note (Signed)
Case Management Note  Patient Details  Name: Carlos Herman MRN: [id number] Date of Birth: [birth year]  Subjective/Objective:   S/p coronary intervention, will be on plavix.  NCM will cont to follow for dc needs.                 Action/Plan:   Expected Discharge Date:                  Expected Discharge Plan:  Home/Self Care  In-House Referral:     Discharge planning Services  CM Consult  Post Acute Care Choice:    Choice offered to:     DME Arranged:    DME Agency:     HH Arranged:    HH Agency:     Status of Service:  Completed, signed off  If discussed at [employer] of Stay Meetings, dates discussed:    Additional Comments:  Zenon Mayo, RN 03/26/2016, 12:05 PM

## 2016-03-26 NOTE — Progress Notes (Signed)
PROGRESS NOTE    Carlos Herman  [id number] DOB: [birth year] DOA: 03/23/2016 PCP: Kandice Hams, MD   Outpatient Specialists:     Brief Narrative:  Carlos Herman is a 75 y.o. male with medical history significant of umbilical hernia repair.  Patient presents to the ED with 1 day history of nausea.  Woke up in the morning not feeling well.  Had 1 episode of vomiting after lunchtime.  Persistent nausea since that time.  Does take chronic pain meds and tends to be constipated.  Small BM yesterday but none today.  In ER found to have early SBO vs enteritis with ileus on CT scan. Patient became very upset with nursing and then developed chest pain.  CE positive and cardiology consulted   Assessment & Plan:   Principal Problem:   SBO (small bowel obstruction) ([place]) Active Problems:   Essential hypertension   Coronary artery disease involving native coronary artery   S/P CABG 1996   Pancreatic lesion   Acute coronary syndrome (HCC)   Elevated troponin - consider NSTEMI   NSTEMI (non-ST elevated myocardial infarction) (HCC)     SBO - vs enteritis with ileus vs early small bowel obstruction,+flatus, currently on a heart healthy diet- advance as tolerated, start patient on aggressive constipation regimen,Mobilize patient,zofran prn    Pancreatic lesion -1.5 cm in the proximal body of the pancreas, grossly similar to prior CT in 2015. Needs follow up MRI non-emergently,D/W patient, Patient would prefer to have this done as outpatient in an open MRI (we dont have open MRI here at [facility]).,   Acute coronary syndrome (HCC) /  Elevated troponin - consider NSTEMI vs. demand ischemia Coronary artery disease involving native coronary artery / S/P CABG 1996  Long cardiac history,Cardiology consulted, status postcardiac cath, severe three-vessel coronary artery disease with multiple stents.Successful stenting of the proximal and distal SVG with DES.-- DAPT indefinitely   Left renal  lesion 1.5 cm left renal inferior pole hypodense lesion appears similar to prior study but incompletely characterized on this CT. Patient also has mild right hydronephrosis and right renal calculi.  Cholelithiasis-incidental finding, check lipase, no evidence of cholecystitis  Diabetes mellitus type  2-continue to hold metformin-Accu-Chek stable, continue SSI, most recent hemoglobin A1c 7.1    DVT prophylaxis:  heparin gtt  Code Status: Full Code  Family Communication: patient  Disposition Plan: PT OT eval, anticipate discharge tomorrow    Consultants:   cardiology      Subjective: +flatus, no BM since admission, very angry about his home meds not being given   Objective: Vitals:   03/25/16 2220 03/25/16 2330 03/26/16 0340 03/26/16 0640  BP: (!) 150/77 (!) 144/65 (!) 134/56 (!) 133/50  Pulse: 73 62 61 63  Resp: 19 18 17 17   Temp:   97.8 F (36.6 C)   TempSrc:   Oral   SpO2: 96% 95% 92% 97%  Weight:   100 kg (220 lb 7.4 oz)   Height:        Intake/Output Summary (Last 24 hours) at 03/26/16 0736 Last data filed at 03/26/16 0130  Gross per 24 hour  Intake          1845.13 ml  Output              900 ml  Net           945.13 ml   Filed Weights   03/24/16 0314 03/26/16 0340  Weight: 101.7 kg (224 lb 1.6 oz) 100 kg (220  lb 7.4 oz)    Examination:  General exam: Appears calm and comfortable- very irritable Respiratory system: Clear to auscultation. Respiratory effort normal. Cardiovascular system: S1 & S2 heard, RRR. No JVD, murmurs, rubs, gallops or clicks. No pedal edema. Gastrointestinal system: Abdomen is  Mildly distended, soft and nontender. No organomegaly or masses felt.  bowel sounds heard. Central nervous system: Alert and oriented. No focal neurological deficits.     Data Reviewed: I have personally reviewed following labs and imaging studies  CBC:  Recent Labs Lab 03/23/16 2149 03/25/16 0531 03/25/16 1723 03/26/16 0355  WBC 14.5* 6.7  6.8 8.0  NEUTROABS 11.8*  --   --   --   HGB 14.9 11.9* 13.0 11.8*  HCT 44.8 36.4* 41.3 36.5*  MCV 95.5 96.8 99.5 97.3  PLT 358 269 260 [id number]   Basic Metabolic Panel:  Recent Labs Lab 03/23/16 2149 03/25/16 0531 03/26/16 0355  NA 134* 140 137  K 4.5 3.6 3.8  CL 100* 109 104  CO2 21* 23 25  GLUCOSE 190* 103* 126*  BUN 12 8 6   CREATININE 1.02 0.88 0.94  CALCIUM 9.4 8.0* 8.3*   GFR: Estimated Creatinine Clearance: 80.4 mL/min (by C-G formula based on SCr of 0.94 mg/dL). Liver Function Tests:  Recent Labs Lab 03/23/16 2149 03/26/16 0355  AST 20 16  ALT 14* 11*  ALKPHOS 78 56  BILITOT 0.8 0.7  PROT 7.1 5.2*  ALBUMIN 4.1 2.9*   No results for input(s): LIPASE, AMYLASE in the last 168 hours. No results for input(s): AMMONIA in the last 168 hours. Coagulation Profile: No results for input(s): INR, PROTIME in the last 168 hours. Cardiac Enzymes:  Recent Labs Lab 03/24/16 0827 03/24/16 1339 03/24/16 1938 03/25/16 0531  TROPONINI <0.03 0.17* 0.35* 0.32*   BNP (last 3 results) No results for input(s): PROBNP in the last 8760 hours. HbA1C: No results for input(s): HGBA1C in the last 72 hours. CBG:  Recent Labs Lab 03/25/16 0820 03/25/16 1139 03/25/16 1620 03/25/16 2214 03/26/16 0652  GLUCAP 108* 113* 110* 141* 122*   Lipid Profile:  Recent Labs  03/25/16 0531  CHOL 120  HDL 24*  LDLCALC 64  TRIG 158*  CHOLHDL 5.0   Thyroid Function Tests: No results for input(s): TSH, T4TOTAL, FREET4, T3FREE, THYROIDAB in the last 72 hours. Anemia Panel: No results for input(s): VITAMINB12, FOLATE, FERRITIN, TIBC, IRON, RETICCTPCT in the last 72 hours. Urine analysis:    Component Value Date/Time   COLORURINE YELLOW 03/23/2016 0037   APPEARANCEUR CLEAR 03/23/2016 0037   LABSPEC 1.020 03/23/2016 0037   PHURINE 5.5 03/23/2016 0037   GLUCOSEU NEGATIVE 03/23/2016 0037   HGBUR NEGATIVE 03/23/2016 0037   BILIRUBINUR NEGATIVE 03/23/2016 0037   KETONESUR 15 (A)  03/23/2016 0037   PROTEINUR NEGATIVE 03/23/2016 0037   UROBILINOGEN 0.2 02/11/2010 0800   NITRITE NEGATIVE 03/23/2016 0037   LEUKOCYTESUR NEGATIVE 03/23/2016 0037     )No results found for this or any previous visit (from the past 240 hour(s)).    Anti-infectives    None       Radiology Studies: No results found.      Scheduled Meds: . amLODipine  10 mg Oral Daily  . angioplasty book   Does not apply Once  . aspirin EC  81 mg Oral Daily  . clopidogrel  75 mg Oral Q breakfast  . docusate sodium  100 mg Oral BID  . heparin  5,000 Units Subcutaneous Q8H  . insulin aspart  0-9 Units  Subcutaneous TID WC  . isosorbide mononitrate  60 mg Oral QHS  . metoprolol succinate  100 mg Oral Daily  . metoprolol succinate  50 mg Oral QHS  . sodium chloride flush  3 mL Intravenous Q12H   Continuous Infusions:     LOS: 2 days    Time spent: 25 min    Reyne Dumas, M.D. [facility] [phone]  If 7PM-7AM, please contact night-coverage [email or web address] Password [facility] 03/26/2016, 7:36 AM

## 2016-03-26 NOTE — Progress Notes (Signed)
CARDIAC REHAB PHASE I   PRE:  Rate/Rhythm: 13 SR with PVC/PACs    BP: sitting 141/60    SaO2:   MODE:  Ambulation: 400 ft   POST:  Rate/Rhythm: 80 SR    BP: sitting 165/70     SaO2:   Pt able to walk independently with limp with his shoes on. Sts "he does what he can". No c/o (numerous c/o his care at hospital) while walking, VSS. Ed completed with pt with fair reception. Requests his referral be sent to [place]. Understands importance of Plavix/ASA. Encouraged more fruits/vegetables. He can walk independently.  [address], ACSM 03/26/2016 2:12 PM

## 2016-03-26 NOTE — Discharge Summary (Signed)
Physician Discharge Summary  Carlos Herman MRN: [id number] DOB/AGE: 1941/05/06 75 y.o.  PCP: Carlos Hams, MD   Admit date: 03/23/2016 Discharge date: 03/26/2016  Discharge Diagnoses:    Principal Problem:   SBO (small bowel obstruction) ([facility]) Active Problems:   Essential hypertension   Coronary artery disease involving native coronary artery   S/P CABG 1996   Pancreatic lesion   NSTEMI (non-ST elevated myocardial infarction) ([facility])    Follow-up recommendations Follow-up with PCP in 3-5 days , including all  additional recommended appointments as below Follow-up CBC, CMP in 3-5 days Outpatient MRI for pancreatic mass, to be arranged for by PCP        Current Discharge Medication List    START taking these medications   Details  docusate sodium (COLACE) 100 MG capsule Take 1 capsule (100 mg total) by mouth 2 (two) times daily. Qty: 10 capsule, Refills: 0      CONTINUE these medications which have CHANGED   Details  metFORMIN (GLUCOPHAGE) 500 MG tablet Take 2 tablets (1,000 mg total) by mouth 2 (two) times daily with a meal.      CONTINUE these medications which have NOT CHANGED   Details  amLODipine (NORVASC) 10 MG tablet Take 1 tablet (10 mg total) by mouth at bedtime. Qty: 90 tablet, Refills: 3    aspirin EC 81 MG tablet Take 81 mg by mouth daily.    clopidogrel (PLAVIX) 75 MG tablet Take 1 tablet (75 mg total) by mouth daily with breakfast. Qty: 90 tablet, Refills: 3    isosorbide mononitrate (IMDUR) 60 MG 24 hr tablet Take 1 tablet (60 mg total) by mouth at bedtime. Qty: 90 tablet, Refills: 3    metoprolol succinate (TOPROL-XL) 100 MG 24 hr tablet take 1 tablet by mouth every morning and 1/2 tablet every evening (TAKE WITH OR IMMEDIATELY AFTER MEALS) Qty: 135 tablet, Refills: 1    Multiple Minerals-Vitamins (CAL MAG ZINC +D3 PO) Take 1 tablet by mouth 2 (two) times daily. Per 3 tablets; Calcium 1000 mg, magnesium 400 mg, zinc 25 mg, vitamin D3 600  I.U.    nitroGLYCERIN (NITROSTAT) 0.4 MG SL tablet Place 1 tablet (0.4 mg total) under the tongue every 5 (five) minutes as needed for chest pain. Qty: 25 tablet, Refills: 4    oxyCODONE-acetaminophen (PERCOCET) 7.5-325 MG tablet Take 1 tablet by mouth 2 (two) times daily.  Refills: 0         Discharge Condition: stable   Discharge Instructions Get Medicines reviewed and adjusted: Please take all your medications with you for your next visit with your Primary MD  Please request your Primary MD to go over all hospital tests and procedure/radiological results at the follow up, please ask your Primary MD to get all Hospital records sent to his/her office.  If you experience worsening of your admission symptoms, develop shortness of breath, life threatening emergency, suicidal or homicidal thoughts you must seek medical attention immediately by calling 911 or calling your MD immediately if symptoms less severe.  You must read complete instructions/literature along with all the possible adverse reactions/side effects for all the Medicines you take and that have been prescribed to you. Take any new Medicines after you have completely understood and accpet all the possible adverse reactions/side effects.   Do not drive when taking Pain medications.   Do not take more than prescribed Pain, Sleep and Anxiety Medications  Special Instructions: If you have smoked or chewed Tobacco in the last 2 yrs please  stop smoking, stop any regular Alcohol and or any Recreational drug use.  Wear Seat belts while driving.  Please note  You were cared for by a hospitalist during your hospital stay. Once you are discharged, your primary care physician will handle any further medical issues. Please note that NO REFILLS for any discharge medications will be authorized once you are discharged, as it is imperative that you return to your primary care physician (or establish a relationship with a primary care  physician if you do not have one) for your aftercare needs so that they can reassess your need for medications and monitor your lab values.  Discharge Instructions    Amb Referral to Cardiac Rehabilitation    Complete by:  As directed    Diagnosis:   PTCA Coronary Stents     Diet - low sodium heart healthy    Complete by:  As directed    Increase activity slowly    Complete by:  As directed        Allergies  Allergen Reactions  . Amlodipine Besylate Other (See Comments)    Please use [employer] brand amlodipine for pt - states he becomes aggressive/agitated and refuses other manufacturers  . Penicillins Swelling and Other (See Comments)    Redness Has patient had a PCN reaction causing immediate rash, facial/tongue/throat swelling, SOB or lightheadedness with hypotension: No Has patient had a PCN reaction causing severe rash involving mucus membranes or skin necrosis: No Has patient had a PCN reaction that required hospitalization No Has patient had a PCN reaction occurring within the last 10 years: Non If all of the above answers are "NO", then may proceed with Cephalosporin use.   . Statins Other (See Comments)      muscle pain      Disposition: 01-Home or Self Care   Consults:  cardiology     Significant Diagnostic Studies:  Ct Abdomen Pelvis W Contrast  Result Date: 03/24/2016 CLINICAL DATA:  75 year old male with abdominal pain. EXAM: CT ABDOMEN AND PELVIS WITH CONTRAST TECHNIQUE: Multidetector CT imaging of the abdomen and pelvis was performed using the standard protocol following bolus administration of intravenous contrast. CONTRAST:  141m ISOVUE-300 IOPAMIDOL (ISOVUE-300) INJECTION 61% COMPARISON:  Abdominal radiograph dated 03/23/2016 and CT dated 04/27/2014 FINDINGS: Lower chest: Right lung base subsegmental atelectasis versus pneumonia. There are bilateral lower lobe pulmonary artery emboli which appear linear in morphology and similar to the PE seen on the chest  CT dated 04/10/2014. There is coronary vascular calcification and CABG surgery. No intra-abdominal free air or free fluid. Hepatobiliary: Small stones within the gallbladder fundus. No pericholecystic fluid. Probable mild fatty infiltration of the liver. No biliary ductal dilatation. Pancreas: There multiple hypodense lesions in the pancreas measuring up to 1.5 cm in the proximal body of the pancreas. These lesions are grossly similar to the lesion seen on the prior CT of 2015 further evaluation with MRI without and with contrast, if not performed, recommended. There is no dilatation of the main pancreatic duct. No active pancreatic inflammatory changes. Spleen: Normal in size without focal abnormality. Adrenals/Urinary Tract: The adrenal glands appear unremarkable. There is mild bilateral renal atrophy. Multiple right renal calculi noted. There is a 1 cm stone in the right renal pelvis with mild right hydronephrosis. Multiple smaller right renal inferior pole calculi noted. There is no hydronephrosis or nephrolithiasis on the left. The 1.5 cm left renal inferior pole hypodense lesion appears similar to prior study but incompletely characterized on this CT. This most  likely represents a cyst. Further evaluation with nonemergent ultrasound recommended. The visualized ureters and urinary bladder appear unremarkable. Stomach/Bowel: There is sigmoid diverticulosis and scattered colonic diverticula without active inflammatory changes. Multiple mildly dilated loops of small bowel in the mid to lower abdomen noted. There is segmental thickening of the small bowel in the lower abdomen and right hemipelvis likely representing enteritis with associated ileus or low-grade/ with transition zone in the right hemipelvis. The distal small bowel and terminal ileum are decompressed. Normal appendix. Vascular/Lymphatic: There is moderate aortoiliac atherosclerotic disease. There is heavy atherosclerotic calcification of the origins  of the celiac axis, SMA, and IMA. The origins of the these vessels however remain patent. No portal venous gas identified. There is no adenopathy. Reproductive: The prostate and seminal vesicles are grossly unremarkable. 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 Multilevel degenerative changes of the spine with osteophyte formation. L4-L5 spinous process fixation hardware. No acute fracture. Postsurgical changes of the lower lumbar spine. There is laminectomy at L2. A 3.5 x 1.7 cm fluid collection in the posterior paraspinal soft tissues in the midline at this level noted. IMPRESSION: Findings likely represent enteritis with segmental ileus versus an early small-bowel obstruction with transition zone in the right hemipelvis. Clinical correlation and follow-up recommended. Cholelithiasis. Right renal calculi with mild right hydronephrosis. Stable appearing hypodense pancreatic lesions. Further evaluation with nonemergent MRI without and with contrast recommended. Electronically Signed   By: Arash  Radparvar M.D.   On: 03/24/2016 01:38   Dg Abd Acute W/chest  Result Date: 03/23/2016 CLINICAL DATA:  Acute onset of nausea and vomiting. Initial encounter. EXAM: DG ABDOMEN ACUTE W/ 1V CHEST COMPARISON:  Chest radiograph performed 01/29/2016, and CT of the abdomen and pelvis from 04/27/2014 FINDINGS: The lungs are well-aerated. There is mild elevation of the right hemidiaphragm. There is no evidence of focal opacification, pleural effusion or pneumothorax. The cardiomediastinal silhouette is mildly enlarged. The patient is status post median sternotomy, with evidence of prior CABG. There is distention of small-bowel loops to 4.7 cm in maximal diameter, with suggestion of underlying diffuse small bowel wall thickening. This may reflect underlying infectious or inflammatory ileitis. Air is still seen within the colon, without definite  evidence for bowel obstruction. Would correlate with the patient's symptoms. No acute osseous abnormalities are seen; the sacroiliac joints are unremarkable in appearance. The patient's right hip arthroplasty is grossly unremarkable in appearance, though incompletely imaged. Lumbar spinal fusion hardware is also seen. IMPRESSION: 1. Distention of small-bowel loops to 4.7 cm in maximal diameter, with suggestion of underlying diffuse bowel wall thickening. This may reflect underlying infectious or inflammatory ileitis. The appearance is less typical for bowel obstruction. Would correlate with the patient's symptoms. 2. Mild elevation of the right hemidiaphragm. Mild cardiomegaly. Lungs remain grossly clear. Electronically Signed   By: Jeffery  Chang M.D.   On: 03/23/2016 22:58    Cath- 9/ 26 1. Severe 3 vessel obstructive CAD    - 100% proximal LAD, 90% distal LAD    - 100% proximal LCx.     - Multiple stents in the RCA are patent. There is severe disease in small distal branches including RV marginal and small PDA 2. Patent LIMA to the LAD. There is severe disease in the distal LAD.  3. Subtotal occlusion of the SVG to the first diagonal at the distal anastomosis. The native diagonal is occluded. 4. Patent sequential SVG to the first and second OM vessels.   There is a severe stenosis in the proximal SVG and in the distal SVG just prior to the anastomosis. 5. Occluded SVG to the RCA- chronic 6. Normal LV function, normal LVEDP 7. Successful stenting of the proximal and distal SVG with DES.  Plan: DAPT indefinitely. Further plans per primary team.    Filed Weights   03/24/16 0314 03/26/16 0340  Weight: 101.7 kg (224 lb 1.6 oz) 100 kg (220 lb 7.4 oz)     Microbiology: No results found for this or any previous visit (from the past 240 hour(s)).     Blood Culture    Component Value Date/Time   SDES BLOOD LEFT ARM 05/05/2007 2300   SDES BLOOD LEFT FOREARM 05/05/2007 2300   SPECREQUEST  BOTTLES DRAWN AEROBIC AND ANAEROBIC 10CC 05/05/2007 2300   SPECREQUEST BOTTLES DRAWN AEROBIC AND ANAEROBIC 10CC 05/05/2007 2300   CULT NO GROWTH 5 DAYS 05/05/2007 2300   CULT NO GROWTH 5 DAYS 05/05/2007 2300   REPTSTATUS 05/11/2007 FINAL 05/05/2007 2300   REPTSTATUS 05/11/2007 FINAL 05/05/2007 2300      Labs: Results for orders placed or performed during the hospital encounter of 03/23/16 (from the past 48 hour(s))  Glucose, capillary     Status: Abnormal   Collection Time: 03/24/16  5:42 PM  Result Value Ref Range   Glucose-Capillary 137 (H) 65 - 99 mg/dL  Troponin I (q 6hr x 3)     Status: Abnormal   Collection Time: 03/24/16  7:38 PM  Result Value Ref Range   Troponin I 0.35 (HH) <0.03 ng/mL    Comment: CRITICAL VALUE NOTED.  VALUE IS CONSISTENT WITH PREVIOUSLY REPORTED AND CALLED VALUE.  Glucose, capillary     Status: Abnormal   Collection Time: 03/24/16  8:50 PM  Result Value Ref Range   Glucose-Capillary 123 (H) 65 - 99 mg/dL  Glucose, capillary     Status: Abnormal   Collection Time: 03/25/16  4:02 AM  Result Value Ref Range   Glucose-Capillary 110 (H) 65 - 99 mg/dL  Basic metabolic panel     Status: Abnormal   Collection Time: 03/25/16  5:31 AM  Result Value Ref Range   Sodium 140 135 - 145 mmol/L   Potassium 3.6 3.5 - 5.1 mmol/L    Comment: DELTA CHECK NOTED   Chloride 109 101 - 111 mmol/L   CO2 23 22 - 32 mmol/L   Glucose, Bld 103 (H) 65 - 99 mg/dL   BUN 8 6 - 20 mg/dL   Creatinine, Ser 0.88 0.61 - 1.24 mg/dL   Calcium 8.0 (L) 8.9 - 10.3 mg/dL   GFR calc non Af Amer >60 >60 mL/min   GFR calc Af Amer >60 >60 mL/min    Comment: (NOTE) The eGFR has been calculated using the CKD EPI equation. This calculation has not been validated in all clinical situations. eGFR's persistently <60 mL/min signify possible Chronic Kidney Disease.    Anion gap 8 5 - 15  Heparin level (unfractionated)     Status: None   Collection Time: 03/25/16  5:31 AM  Result Value Ref Range    Heparin Unfractionated 0.67 0.30 - 0.70 IU/mL    Comment:        IF HEPARIN RESULTS ARE BELOW EXPECTED VALUES, AND PATIENT DOSAGE HAS BEEN CONFIRMED, SUGGEST FOLLOW UP TESTING OF ANTITHROMBIN III LEVELS.   CBC     Status: Abnormal   Collection Time: 03/25/16  5:31 AM  Result Value Ref Range   WBC 6.7 4.0 - 10.5   K/uL   RBC 3.76 (L) 4.22 - 5.81 MIL/uL   Hemoglobin 11.9 (L) 13.0 - 17.0 g/dL    Comment: REPEATED TO VERIFY   HCT 36.4 (L) 39.0 - 52.0 %   MCV 96.8 78.0 - 100.0 fL   MCH 31.6 26.0 - 34.0 pg   MCHC 32.7 30.0 - 36.0 g/dL   RDW 12.8 11.5 - 15.5 %   Platelets 269 150 - 400 K/uL  Lipid panel     Status: Abnormal   Collection Time: 03/25/16  5:31 AM  Result Value Ref Range   Cholesterol 120 0 - 200 mg/dL   Triglycerides 158 (H) <150 mg/dL   HDL 24 (L) >40 mg/dL   Total CHOL/HDL Ratio 5.0 RATIO   VLDL 32 0 - 40 mg/dL   LDL Cholesterol 64 0 - 99 mg/dL    Comment:        Total Cholesterol/HDL:CHD Risk Coronary Heart Disease Risk Table                     Men   Women  1/2 Average Risk   3.4   3.3  Average Risk       5.0   4.4  2 X Average Risk   9.6   7.1  3 X Average Risk  23.4   11.0        Use the calculated Patient Ratio above and the CHD Risk Table to determine the patient's CHD Risk.        ATP III CLASSIFICATION (LDL):  <100     mg/dL   Optimal  100-129  mg/dL   Near or Above                    Optimal  130-159  mg/dL   Borderline  160-189  mg/dL   High  >190     mg/dL   Very High   Troponin I     Status: Abnormal   Collection Time: 03/25/16  5:31 AM  Result Value Ref Range   Troponin I 0.32 (HH) <0.03 ng/mL    Comment: CRITICAL VALUE NOTED.  VALUE IS CONSISTENT WITH PREVIOUSLY REPORTED AND CALLED VALUE.  Glucose, capillary     Status: Abnormal   Collection Time: 03/25/16  8:20 AM  Result Value Ref Range   Glucose-Capillary 108 (H) 65 - 99 mg/dL  Glucose, capillary     Status: Abnormal   Collection Time: 03/25/16 11:39 AM  Result Value Ref Range    Glucose-Capillary 113 (H) 65 - 99 mg/dL  POCT Activated clotting time     Status: None   Collection Time: 03/25/16  2:30 PM  Result Value Ref Range   Activated Clotting Time 527 seconds  Glucose, capillary     Status: Abnormal   Collection Time: 03/25/16  4:20 PM  Result Value Ref Range   Glucose-Capillary 110 (H) 65 - 99 mg/dL  CBC     Status: Abnormal   Collection Time: 03/25/16  5:23 PM  Result Value Ref Range   WBC 6.8 4.0 - 10.5 K/uL   RBC 4.15 (L) 4.22 - 5.81 MIL/uL   Hemoglobin 13.0 13.0 - 17.0 g/dL   HCT 41.3 39.0 - 52.0 %   MCV 99.5 78.0 - 100.0 fL   MCH 31.3 26.0 - 34.0 pg   MCHC 31.5 30.0 - 36.0 g/dL   RDW 12.7 11.5 - 15.5 %   Platelets 260 150 - 400 K/uL  Glucose, capillary       Status: Abnormal   Collection Time: 03/25/16 10:14 PM  Result Value Ref Range   Glucose-Capillary 141 (H) 65 - 99 mg/dL   Comment 1 Notify RN    Comment 2 Document in Chart   Comprehensive metabolic panel     Status: Abnormal   Collection Time: 03/26/16  3:55 AM  Result Value Ref Range   Sodium 137 135 - 145 mmol/L   Potassium 3.8 3.5 - 5.1 mmol/L   Chloride 104 101 - 111 mmol/L   CO2 25 22 - 32 mmol/L   Glucose, Bld 126 (H) 65 - 99 mg/dL   BUN 6 6 - 20 mg/dL   Creatinine, Ser 0.94 0.61 - 1.24 mg/dL   Calcium 8.3 (L) 8.9 - 10.3 mg/dL   Total Protein 5.2 (L) 6.5 - 8.1 g/dL   Albumin 2.9 (L) 3.5 - 5.0 g/dL   AST 16 15 - 41 U/L   ALT 11 (L) 17 - 63 U/L   Alkaline Phosphatase 56 38 - 126 U/L   Total Bilirubin 0.7 0.3 - 1.2 mg/dL   GFR calc non Af Amer >60 >60 mL/min   GFR calc Af Amer >60 >60 mL/min    Comment: (NOTE) The eGFR has been calculated using the CKD EPI equation. This calculation has not been validated in all clinical situations. eGFR's persistently <60 mL/min signify possible Chronic Kidney Disease.    Anion gap 8 5 - 15  CBC     Status: Abnormal   Collection Time: 03/26/16  3:55 AM  Result Value Ref Range   WBC 8.0 4.0 - 10.5 K/uL   RBC 3.75 (L) 4.22 - 5.81  MIL/uL   Hemoglobin 11.8 (L) 13.0 - 17.0 g/dL   HCT 36.5 (L) 39.0 - 52.0 %   MCV 97.3 78.0 - 100.0 fL   MCH 31.5 26.0 - 34.0 pg   MCHC 32.3 30.0 - 36.0 g/dL   RDW 12.7 11.5 - 15.5 %   Platelets 273 150 - 400 K/uL  Lipase, blood     Status: None   Collection Time: 03/26/16  3:55 AM  Result Value Ref Range   Lipase 17 11 - 51 U/L  Glucose, capillary     Status: Abnormal   Collection Time: 03/26/16  6:52 AM  Result Value Ref Range   Glucose-Capillary 122 (H) 65 - 99 mg/dL   Comment 1 Notify RN    Comment 2 Document in Chart   Glucose, capillary     Status: Abnormal   Collection Time: 03/26/16 12:23 PM  Result Value Ref Range   Glucose-Capillary 158 (H) 65 - 99 mg/dL     Lipid Panel     Component Value Date/Time   CHOL 120 03/25/2016 0531   TRIG 158 (H) 03/25/2016 0531   HDL 24 (L) 03/25/2016 0531   CHOLHDL 5.0 03/25/2016 0531   VLDL 32 03/25/2016 0531   LDLCALC 64 03/25/2016 0531     Lab Results  Component Value Date   HGBA1C 7.1 (H) 01/29/2016   HGBA1C (H) 10/16/2008    7.4 (NOTE) The ADA recommends the following therapeutic goal for glycemic control related to Hgb A1c measurement: Goal of therapy: <6.5 Hgb A1c  Reference: American Diabetes Association: Clinical Practice Recommendations 2010, Diabetes Care, 2010, 33: (Suppl  1).   HGBA1C (H) 08/03/2007    7.4 (NOTE)   The ADA recommends the following therapeutic goals for glycemic   control related to Hgb A1C measurement:   Goal of Therapy:   < 7.0%   Hgb A1C   Action Suggested:  > 8.0% Hgb A1C   Ref:  Diabetes Care, 22, Suppl. 1, 1999     Lab Results  Component Value Date   [place] 64 03/25/2016   CREATININE 0.94 03/26/2016     HPI : 75 year old male with past medical history of coronary artery disease S/P CABG in 1996. He underwent cath in 2009 with noted patent RCA with diffuse stenting, occluded circumflex and LAD with patent SVG to diagonal feeling the LAD, but occluded SVG to OM and SVG to RCA. He was  placed on anticoagulation with Eliquis in October 2015 for bilateral PEs. He was again seen in the office in December 15 where he complained of chest pain with angina and was placed for cardiac cath with significant disease in his RCA. He underwent PCI with DES to his mid RCA with Cutting Balloon to distal RCA. He was then continued on Eliquis and Plavix for a year and no aspirin. He was followed up in the office on 2/16 with an APP and reported complaints of dizziness and his blood pressure was noted to be elevated. He was advised to hold his ramapril and follow-up. He was placed on a Holter monitor which noted PACs and PVCs with no arrhythmia. Carotids were reviewed on 2/16 and normal. He was last seen in the office on 3/17 by Dr. Johnsie Cancel, or he reported his dizziness had resolved. He was continued on his home medications with no changes.   He reports being in his usual state of health until recently. He presented to the [facility] ED c/o nausea and abd pain for the past 2 days. In the ED he had a CT scan which noted early SOB vs enteritis with ileus. He was admitted to Internal Medicine for further work up.   HOSPITAL COURSE:   SBO - vs enteritis with ileus vs early small bowel obstruction,+flatus, currently on a heart healthy diet- advance as tolerated, start patient on aggressive constipation regimen,Mobilize patient,zofran prn ,had to BM, eating, wants to go home tonight, doesn't want to stay one more day   Pancreatic lesion -1.5 cm in the proximal body of the pancreas, grossly similar to prior CT in 2015. Needs follow up MRI non-emergently,D/W patient, Patient would prefer to have this done as outpatient in an open MRI (we dont have open MRI here at [facility]).,   Acute coronary syndrome (HCC) / Elevated troponin - consider NSTEMI vs. demand ischemia Coronary artery disease involving native coronary artery /S/P CABG 1996  Long cardiac history,Cardiology consulted, status postcardiac cath,  severe three-vessel coronary artery disease with multiple stents.treated with DES x 2.-- DAPT indefinitely  Remains on full anti-ischemic regimen including aspirin, Plavix, beta blocker, Imdur and amlodipine -- Imdur dose was increased to 60 mg. Had previously been on ACE-I,but this was d/c'd by Dr. Johnsie Cancel he seems stable from a CAD/Cardiac standpoint   Left renal lesion 1.5 cm left renal inferior pole hypodense lesion appears similar to prior study but incompletely characterized on this CT. Patient also has mild right hydronephrosis and right renal calculi.Renal function stable , patient made aware of CT findings    Cholelithiasis-incidental finding,  Lipase 17 , no evidence of cholecystitis  Diabetes mellitus type  2-continue to hold metformin until 9/28-Accu-Chek stable, continue SSI, most recent hemoglobin A1c 7.1   Discharge Exam:  Blood pressure (!) 141/60, pulse 68, temperature 97.7 F (36.5 C), temperature source Oral, resp. rate 14, height 5' 9" (1.753 m), weight 100 kg (220  lb 7.4 oz), SpO2 96 %.      Follow-up Information    POLITE,RONALD D, MD. Call in 2 day(s).   Specialty:  Internal Medicine Why:  hospital follow up Contact information: [address] [place] [address] [phone]           Signed: Reyne Dumas 03/26/2016, 5:20 PM        Time spent >45 mins

## 2016-03-28 ENCOUNTER — Ambulatory Visit
Admission: RE | Admit: 2016-03-28 | Discharge: 2016-03-28 | Disposition: A | Discharge status: Home / Self Care | Payer: Medicare Other | Source: Ambulatory Visit | Attending: Internal Medicine | Admitting: Internal Medicine

## 2016-03-28 ENCOUNTER — Other Ambulatory Visit: Payer: Self-pay | Admitting: Internal Medicine

## 2016-03-28 DIAGNOSIS — M8580 Other specified disorders of bone density and structure, unspecified site: Secondary | ICD-10-CM | POA: Diagnosis not present

## 2016-03-28 DIAGNOSIS — E663 Overweight: Secondary | ICD-10-CM | POA: Diagnosis not present

## 2016-03-28 DIAGNOSIS — E1340 Other specified diabetes mellitus with diabetic neuropathy, unspecified: Secondary | ICD-10-CM | POA: Diagnosis not present

## 2016-03-28 DIAGNOSIS — R609 Edema, unspecified: Secondary | ICD-10-CM

## 2016-03-28 DIAGNOSIS — I10 Essential (primary) hypertension: Secondary | ICD-10-CM | POA: Diagnosis not present

## 2016-03-28 DIAGNOSIS — I25118 Atherosclerotic heart disease of native coronary artery with other forms of angina pectoris: Secondary | ICD-10-CM | POA: Diagnosis not present

## 2016-03-28 DIAGNOSIS — M79604 Pain in right leg: Secondary | ICD-10-CM

## 2016-03-28 DIAGNOSIS — Z1389 Encounter for screening for other disorder: Secondary | ICD-10-CM | POA: Diagnosis not present

## 2016-03-28 DIAGNOSIS — M5416 Radiculopathy, lumbar region: Secondary | ICD-10-CM | POA: Diagnosis not present

## 2016-03-28 DIAGNOSIS — G894 Chronic pain syndrome: Secondary | ICD-10-CM | POA: Diagnosis not present

## 2016-03-28 DIAGNOSIS — E1122 Type 2 diabetes mellitus with diabetic chronic kidney disease: Secondary | ICD-10-CM | POA: Diagnosis not present

## 2016-03-28 DIAGNOSIS — M7989 Other specified soft tissue disorders: Secondary | ICD-10-CM | POA: Diagnosis not present

## 2016-03-28 DIAGNOSIS — Z6833 Body mass index (BMI) 33.0-33.9, adult: Secondary | ICD-10-CM | POA: Diagnosis not present

## 2016-03-28 DIAGNOSIS — E0851 Diabetes mellitus due to underlying condition with diabetic peripheral angiopathy without gangrene: Secondary | ICD-10-CM | POA: Diagnosis not present

## 2016-03-28 DIAGNOSIS — Z7984 Long term (current) use of oral hypoglycemic drugs: Secondary | ICD-10-CM | POA: Diagnosis not present

## 2016-03-28 DIAGNOSIS — G5721 Lesion of femoral nerve, right lower limb: Secondary | ICD-10-CM | POA: Diagnosis not present

## 2016-03-28 DIAGNOSIS — Z0001 Encounter for general adult medical examination with abnormal findings: Secondary | ICD-10-CM | POA: Diagnosis not present

## 2016-03-28 DIAGNOSIS — I739 Peripheral vascular disease, unspecified: Secondary | ICD-10-CM | POA: Diagnosis not present

## 2016-03-28 DIAGNOSIS — M150 Primary generalized (osteo)arthritis: Secondary | ICD-10-CM | POA: Diagnosis not present

## 2016-04-04 DIAGNOSIS — Z23 Encounter for immunization: Secondary | ICD-10-CM | POA: Diagnosis not present

## 2016-04-07 ENCOUNTER — Other Ambulatory Visit: Payer: Self-pay | Admitting: Cardiovascular Disease

## 2016-04-17 NOTE — Progress Notes (Signed)
Patient ID: Carlos Herman, male   DOB: 1941/03/08, 75 y.o.   MRN: [id number]    Cardiology Office Note   Date:  04/18/2016   ID:  JYLON BRONIKOWSKI, DOB 04/15/1941, MRN [id number]  PCP:  Kandice Hams, MD  Cardiologist:  Dr. Jenkins Rouge     Chief Complaint  Patient presents with  . Coronary Artery Disease     History of Present Illness: Carlos Herman is a 75 y.o. male with a hx of CAD s/p CABG in 1996, DM, HTN, HLD.  Cath in 2009 with patent RCA with diffuse stenting, occluded Circumflex and LAD with patent SVG to Diagonal filling LAD but occluded SVG to OM and SVG to RCA. He has been on anti-coagulation with Eliquis for bilateral PE dx'd in October 2015.  Patient was seen in the office in December 2015 for chest pain c/w angina and set up for cardiac cath.  LHC revealed significant disease in the native RCA. He underwent PCI with DES to the mid RCA and cutting balloon to the distal RCA. The was placed on Eliquis and Plavix for a year- no ASA.  Last seen by PA 2/16 .  Complaints of dizziness.  BP reportedly in 160/70s.  He was advised to hold Ramipril and to FU today (needs BMET, CBC).  The patient notes dizziness/lightheadedness .  It seems to be hard for him to describe.  He states it is like a "buzzing feeling."  It seems fairly constant.  But, he tends to notice it more when he is upright and moving around.  He denies a spinning sensation.  He denies near syncope or syncope.  He denies any change with change in head position or use of his upper extremities.  He denies chest pain or significant dyspnea.  This remains improved since his PCI.  He is adherent to his medications.  He denies orthopnea, PND.  He has chronic LE edema on the R related to prior injuries/surgeries.  He denies any significant change.  Actually, he feels this has improved since he had his recent PCI.    Ramapril stopped and BP has not been too high Holter reviewed and PAC/PVC no afib Carotid reviewed  08/19/14 and normal with left dominant vertebral  ON eliquis for previous PE 2015 stop in October 2016  No angina busy doing major Spring races at [place] Major limitation to activity is right knee pain post TKR   03/24/16 admitted with partial SBO.  Had SSCP and elevated troponin: Cath Dr Patsy Lager 03/25/16 Interestingly previously atretic LIMA now normal with subtotal occlusion of SVG D1 at distal anastamosis Also SVG OM1/OM2 thought to be previously occluded with severe stenosis with DES to proximal and  Distal aspects of graft. Still has diffuse distal LAD/RCA disease     Past Medical History:  Diagnosis Date  . CAD (coronary artery disease)    a. s/p CABG; b. s/p diffuse stenting to RCA; c. 3/4 grafts known to be occluded;  d.  LHC 12/15:  pLAD 100, small CFx diff disz - no amenable to PCI, RCA severe mid and dist ISR, S-OM, S-RCA 100, L-LAD atretic, S-Dx ok (dist seg of Dx 100) >> PCI:  DES to mRCA and POBA/cutting balloon to dist RCA   . Depression   . DM (diabetes mellitus) ([place])    Type 2  . Dyslipidemia   . Hemorrhoids    internal  . History of Doppler ultrasound    Carotid US (2/16):  Normal carotid  arteries bilaterally.  Marland Kitchen History of hiatal hernia   . History of kidney stones   . History of pneumonia   . History of seizures    "when I was hospitalized for stroke"  . Hx of echocardiogram    a. Echo 10/15:  mild LVH, EF 55-60%, no RWMA, Ao root 39 mm, mild MR, mild LAE  . Hypertension   . Leg edema    right  . Myocardial infarction   . Nephrolithiasis   . Nocturia   . Obesity    morbid  . Osteoarthritis   . Pulmonary embolism ([place])    dx 03/2014 >> On Apixaban  . Stroke ([place]) \   no deficits  . Wears glasses     Past Surgical History:  Procedure Laterality Date  . ANGIOPLASTY / STENTING ILIAC    . BACK SURGERY     x2  . CARDIAC CATHETERIZATION N/A 03/25/2016   Procedure: Left Heart Cath and Cors/Grafts Angiography;  Surgeon: Peter M Martinique, MD;  Location: [place] [facility];  Service: Cardiovascular;  Laterality: N/A;  . CARDIAC CATHETERIZATION N/A 03/25/2016   Procedure: Coronary Stent Intervention;  Surgeon: Peter M Martinique, MD;  Location: [place] [facility];  Service: Cardiovascular;  Laterality: N/A;  . COLONOSCOPY    . Heart Bypass  1996  . LEFT HEART CATHETERIZATION WITH CORONARY/GRAFT ANGIOGRAM N/A 06/16/2014   Procedure: LEFT HEART CATHETERIZATION WITH Beatrix Fetters;  Surgeon: Burnell Blanks, MD;  Location: [facility] CATH LAB;  Service: Cardiovascular;  Laterality: N/A;  . LUMBAR LAMINECTOMY/DECOMPRESSION MICRODISCECTOMY N/A 02/06/2016   Procedure: Laminectomy and Foraminotomy - Lumbar one-Lumbar two;  Surgeon: Eustace Moore, MD;  Location: [place] [facility]UR[facility] OR[facility];  Service: Neurosurgery;  Laterality: N/A;  . PERCUTANEOUS CORONARY STENT INTERVENTION (PCI-S)  06/16/2014   Procedure: PERCUTANEOUS CORONARY STENT INTERVENTION (PCI-S);  Surgeon: Burnell Blanks, MD;  Location: [facility] CATH LAB;  Service: Cardiovascular;;  MID RCA, Cutting Balloon and Stent Distal RCA, Cutting Balloon   . TOTAL KNEE ARTHROPLASTY     right  . UMBILICAL HERNIA REPAIR       Current Outpatient Prescriptions  Medication Sig Dispense Refill  . amLODipine (NORVASC) 10 MG tablet Take 1 tablet (10 mg total) by mouth at bedtime. 90 tablet 3  . aspirin EC 81 MG tablet Take 81 mg by mouth daily.    . clopidogrel (PLAVIX) 75 MG tablet Take 1 tablet (75 mg total) by mouth daily with breakfast. 90 tablet 3  . isosorbide mononitrate (IMDUR) 60 MG 24 hr tablet take 1 tablet by mouth at bedtime 90 tablet 3  . metFORMIN (GLUCOPHAGE) 500 MG tablet Take 2 tablets (1,000 mg total) by mouth 2 (two) times daily with a meal.    . metoprolol succinate (TOPROL-XL) 100 MG 24 hr tablet take 1 tablet by mouth every morning and 1/2 tablet every evening (TAKE WITH OR IMMEDIATELY AFTER MEALS) 135 tablet 1  . Multiple Minerals-Vitamins (CAL MAG ZINC +D3 PO) Take 1 tablet by mouth 2 (two)  times daily. Per 3 tablets; Calcium 1000 mg, magnesium 400 mg, zinc 25 mg, vitamin D3 600 I.U.    . nitroGLYCERIN (NITROSTAT) 0.4 MG SL tablet Place 1 tablet (0.4 mg total) under the tongue every 5 (five) minutes as needed for chest pain. 25 tablet 4  . oxyCODONE-acetaminophen (PERCOCET) 7.5-325 MG tablet Take 1 tablet by mouth 2 (two) times daily.   0   No current facility-administered medications for this visit.     Allergies:  Amlodipine besylate; Penicillins; and Statins    Social History:  The patient  reports that he has never smoked. He has never used smokeless tobacco. He reports that he drinks alcohol. He reports that he does not use drugs.   Family History:  The patient's family history includes Diabetes in his mother; Stroke in his mother.    ROS:  Please see the history of present illness.   He fell recently and bruised his coccyx.  He was seen by Dr. Nicholaus Bloom (pain doctor) and did not require xrays.  He says he tripped - R leg is weak related to prior injuries/surgeries.  Otherwise, review of systems are positive for none.   All other systems are reviewed and negative.    PHYSICAL EXAM: VS:  BP (!) 140/56   Pulse 71   Ht 5\' 9"  (1.753 m)   Wt 102 kg (224 lb 12.8 oz)   SpO2 93%   BMI 33.20 kg/m    Repeat BP by me:  L 126/60;  R 130/60  No data found.   Wt Readings from Last 3 Encounters:  04/18/16 102 kg (224 lb 12.8 oz)  03/26/16 100 kg (220 lb 7.4 oz)  02/06/16 103 kg (227 lb)     GEN: Well nourished, well developed, in no acute distress  HEENT: normal no obvious nystagmus with lying flat Neck: I cannot appreciate JVD, ? R carotid vs supraclavicular bruit, no masses Cardiac:  Normal 99991111, RRR; 1/6 systolic murmur LLSB, no rubs or gallops, 1+ RLE edema, trace LLE edema  Respiratory:  clear to auscultation bilaterally, no wheezing, rhonchi or rales. GI: soft, nontender, nondistended, + BS MS: no deformity or atrophy  Skin: warm and dry  Neuro:  CNs  II-XII intact, Strength and sensation are intact Psych: Normal affect   EKG:  08/10/14    NSR, HR 69, normal axis, PACs, no change from prior tracing. Done 07/16/14  09/21/15  SR rate 69 PVC    Recent Labs: 03/26/2016: ALT 11; BUN 6; Creatinine, Ser 0.94; Hemoglobin 11.8; Platelets 273; Potassium 3.8; Sodium 137     ASSESSMENT AND PLAN:  1.  Dizziness:  Resolved off ramapril    -  Holter PAC;s PVC;s no PAF      -  Carotids 07/2014 no disease . 2.  Coronary artery disease: CABG 1996 Multiple patent stents in RCA graft and patent LIMA 9/26 multiple DES To proximal and distal OM1/OM2 graft that was previously thought to be occluded DAT 3.  Essential hypertension:  BP at target by my check.  As noted, remain off of Ramipril for now and follow BP 4.  Pulmonary embolus- Oct 2015:  Now off eliquis on plavix for CAD  5. Ortho:  Should see pain management f/u post TKR on right  6. SBO:  CT 03/24/16 enteritis early SBO improved hypodense pancreatic lesions ? F/u MRI   Current medicines are reviewed at length with the patient today.  The patient does not have concerns regarding medicines.  The following changes have been made:  As above.   Labs/ tests ordered today include:  No orders of the defined types were placed in this encounter.   Disposition:   FU with  Me in 6 months    Jenkins Rouge

## 2016-04-18 ENCOUNTER — Encounter: Payer: Self-pay | Admitting: Cardiovascular Disease

## 2016-04-18 ENCOUNTER — Ambulatory Visit (INDEPENDENT_AMBULATORY_CARE_PROVIDER_SITE_OTHER): Payer: Medicare Other | Admitting: Cardiovascular Disease

## 2016-04-18 VITALS — BP 140/56 | HR 71 | Ht 69.0 in | Wt 224.8 lb

## 2016-04-18 DIAGNOSIS — I2583 Coronary atherosclerosis due to lipid rich plaque: Secondary | ICD-10-CM | POA: Diagnosis not present

## 2016-04-18 DIAGNOSIS — I2510 Atherosclerotic heart disease of native coronary artery without angina pectoris: Secondary | ICD-10-CM | POA: Diagnosis not present

## 2016-04-18 NOTE — Patient Instructions (Signed)

## 2016-04-25 DIAGNOSIS — M5416 Radiculopathy, lumbar region: Secondary | ICD-10-CM | POA: Diagnosis not present

## 2016-04-25 DIAGNOSIS — G894 Chronic pain syndrome: Secondary | ICD-10-CM | POA: Diagnosis not present

## 2016-04-25 DIAGNOSIS — G5721 Lesion of femoral nerve, right lower limb: Secondary | ICD-10-CM | POA: Diagnosis not present

## 2016-04-25 DIAGNOSIS — M150 Primary generalized (osteo)arthritis: Secondary | ICD-10-CM | POA: Diagnosis not present

## 2016-05-05 ENCOUNTER — Other Ambulatory Visit: Payer: Self-pay | Admitting: Cardiovascular Disease

## 2016-06-06 DIAGNOSIS — G894 Chronic pain syndrome: Secondary | ICD-10-CM | POA: Diagnosis not present

## 2016-06-06 DIAGNOSIS — M5416 Radiculopathy, lumbar region: Secondary | ICD-10-CM | POA: Diagnosis not present

## 2016-06-06 DIAGNOSIS — G5721 Lesion of femoral nerve, right lower limb: Secondary | ICD-10-CM | POA: Diagnosis not present

## 2016-06-06 DIAGNOSIS — M150 Primary generalized (osteo)arthritis: Secondary | ICD-10-CM | POA: Diagnosis not present

## 2016-07-18 DIAGNOSIS — G894 Chronic pain syndrome: Secondary | ICD-10-CM | POA: Diagnosis not present

## 2016-07-18 DIAGNOSIS — M5416 Radiculopathy, lumbar region: Secondary | ICD-10-CM | POA: Diagnosis not present

## 2016-07-18 DIAGNOSIS — M150 Primary generalized (osteo)arthritis: Secondary | ICD-10-CM | POA: Diagnosis not present

## 2016-07-18 DIAGNOSIS — G5721 Lesion of femoral nerve, right lower limb: Secondary | ICD-10-CM | POA: Diagnosis not present

## 2016-08-15 DIAGNOSIS — M5416 Radiculopathy, lumbar region: Secondary | ICD-10-CM | POA: Diagnosis not present

## 2016-08-15 DIAGNOSIS — G894 Chronic pain syndrome: Secondary | ICD-10-CM | POA: Diagnosis not present

## 2016-08-15 DIAGNOSIS — Z79891 Long term (current) use of opiate analgesic: Secondary | ICD-10-CM | POA: Diagnosis not present

## 2016-08-15 DIAGNOSIS — G5721 Lesion of femoral nerve, right lower limb: Secondary | ICD-10-CM | POA: Diagnosis not present

## 2016-08-15 DIAGNOSIS — M150 Primary generalized (osteo)arthritis: Secondary | ICD-10-CM | POA: Diagnosis not present

## 2016-09-09 ENCOUNTER — Other Ambulatory Visit: Payer: Self-pay | Admitting: Cardiovascular Disease

## 2016-09-10 ENCOUNTER — Other Ambulatory Visit: Payer: Self-pay | Admitting: *Deleted

## 2016-09-10 MED ORDER — METOPROLOL SUCCINATE ER 100 MG PO TB24: ORAL_TABLET | ORAL | 1 refills | Status: DC

## 2016-09-22 DIAGNOSIS — G894 Chronic pain syndrome: Secondary | ICD-10-CM | POA: Diagnosis not present

## 2016-09-22 DIAGNOSIS — M150 Primary generalized (osteo)arthritis: Secondary | ICD-10-CM | POA: Diagnosis not present

## 2016-09-22 DIAGNOSIS — G5721 Lesion of femoral nerve, right lower limb: Secondary | ICD-10-CM | POA: Diagnosis not present

## 2016-09-22 DIAGNOSIS — M5416 Radiculopathy, lumbar region: Secondary | ICD-10-CM | POA: Diagnosis not present

## 2016-09-24 DIAGNOSIS — E1122 Type 2 diabetes mellitus with diabetic chronic kidney disease: Secondary | ICD-10-CM | POA: Diagnosis not present

## 2016-09-24 DIAGNOSIS — E1340 Other specified diabetes mellitus with diabetic neuropathy, unspecified: Secondary | ICD-10-CM | POA: Diagnosis not present

## 2016-09-24 DIAGNOSIS — M8580 Other specified disorders of bone density and structure, unspecified site: Secondary | ICD-10-CM | POA: Diagnosis not present

## 2016-09-24 DIAGNOSIS — E0851 Diabetes mellitus due to underlying condition with diabetic peripheral angiopathy without gangrene: Secondary | ICD-10-CM | POA: Diagnosis not present

## 2016-09-24 DIAGNOSIS — I10 Essential (primary) hypertension: Secondary | ICD-10-CM | POA: Diagnosis not present

## 2016-09-24 DIAGNOSIS — Z7984 Long term (current) use of oral hypoglycemic drugs: Secondary | ICD-10-CM | POA: Diagnosis not present

## 2016-09-24 DIAGNOSIS — Q453 Other congenital malformations of pancreas and pancreatic duct: Secondary | ICD-10-CM | POA: Diagnosis not present

## 2016-09-24 DIAGNOSIS — M545 Low back pain: Secondary | ICD-10-CM | POA: Diagnosis not present

## 2016-09-24 DIAGNOSIS — I25118 Atherosclerotic heart disease of native coronary artery with other forms of angina pectoris: Secondary | ICD-10-CM | POA: Diagnosis not present

## 2016-09-24 DIAGNOSIS — E7800 Pure hypercholesterolemia, unspecified: Secondary | ICD-10-CM | POA: Diagnosis not present

## 2016-10-02 ENCOUNTER — Encounter: Payer: Self-pay | Admitting: Cardiovascular Disease

## 2016-10-12 IMAGING — CT CT ABD-PELV W/ CM
2 of 5 series · 14 of 46 positions shown, 16 images · IV contrast (Omni 300)
Comparison: Abdominal radiograph dated 03/23/2016 and CT dated
04/27/2014

CLINICAL DATA: 74-year-old male with abdominal pain.

EXAM:
CT ABDOMEN AND PELVIS WITH CONTRAST
TECHNIQUE: Multidetector CT imaging of the abdomen and pelvis was performed
using the standard protocol following bolus administration of
intravenous contrast.
CONTRAST:  100mL 2IREIA-877 IOPAMIDOL (2IREIA-877) INJECTION 61%

[Series 2: a/p w/ 5mm · axial · 0.84mm/px · z∈[-538,-48]mm · 11 of 112 slices shown, 13 images]
[im 7/112  soft-tissue]
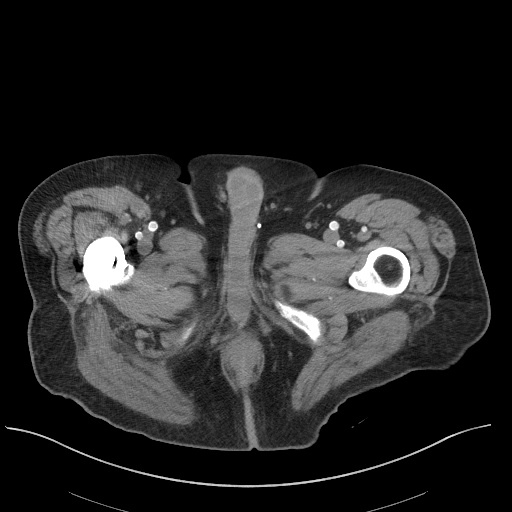
[im 7/112  bone]
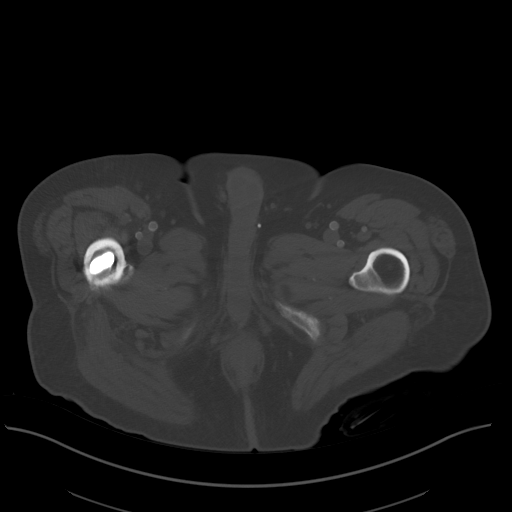
[im 19/112  soft-tissue]
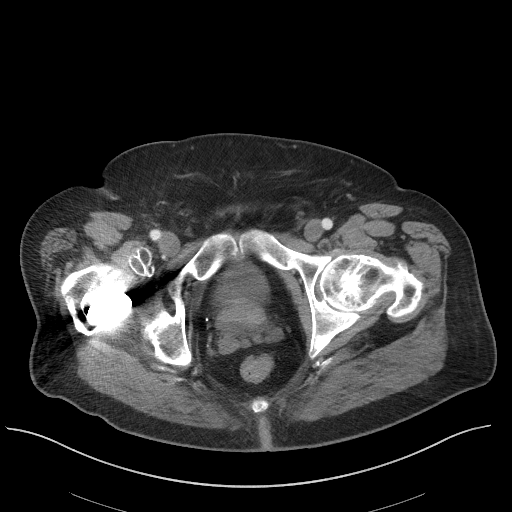
[im 25/112  soft-tissue]
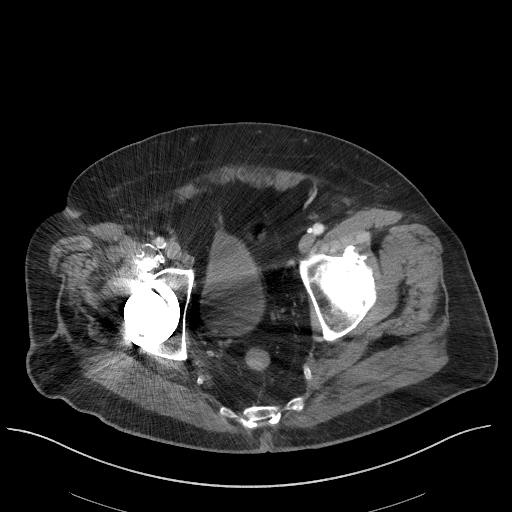
[im 38/112  soft-tissue]
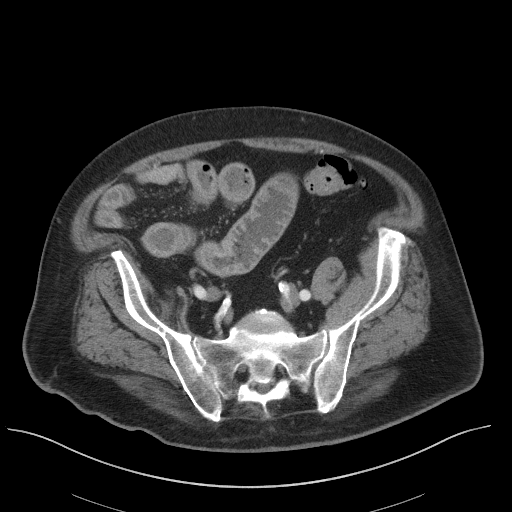
[im 44/112  soft-tissue]
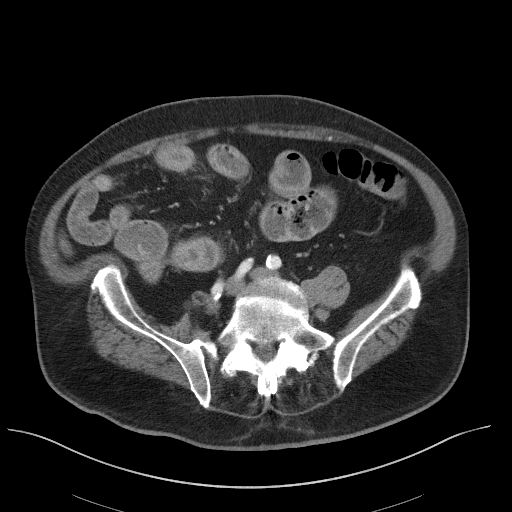
[im 56/112  soft-tissue]
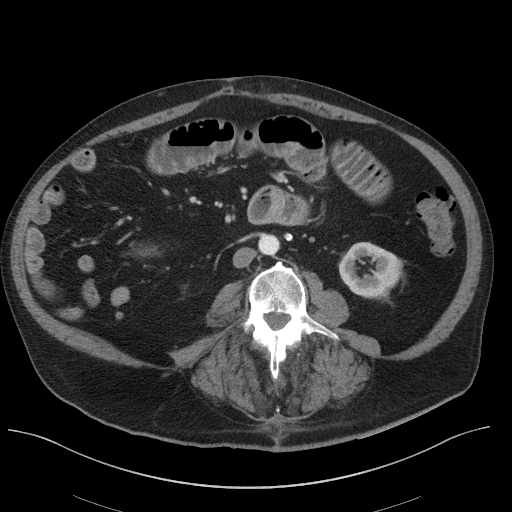
[im 68/112  soft-tissue]
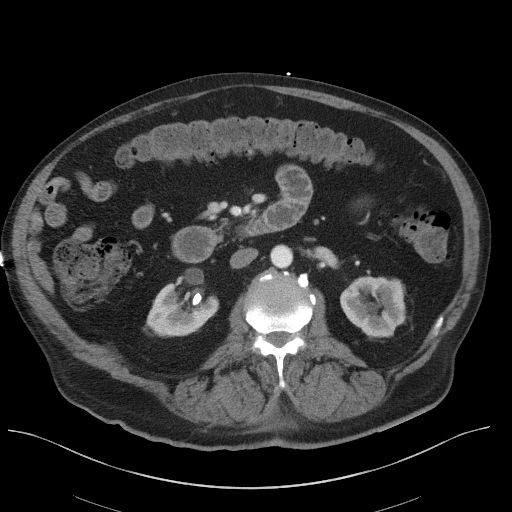
[im 75/112  soft-tissue]
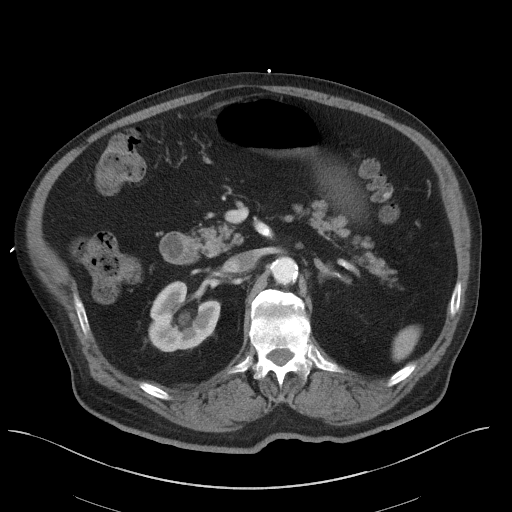
[im 87/112  soft-tissue]
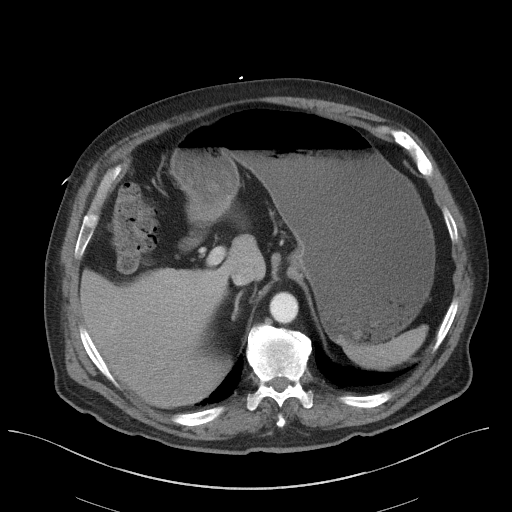
[im 87/112  bone]
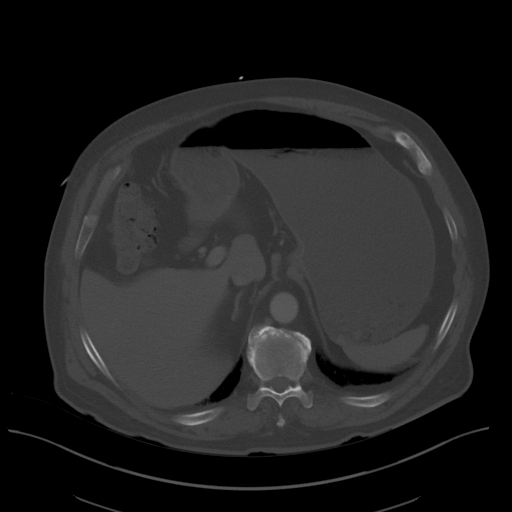
[im 93/112  soft-tissue]
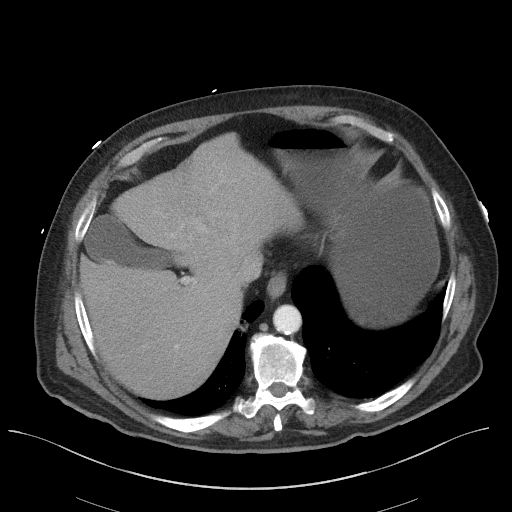
[im 105/112  soft-tissue]
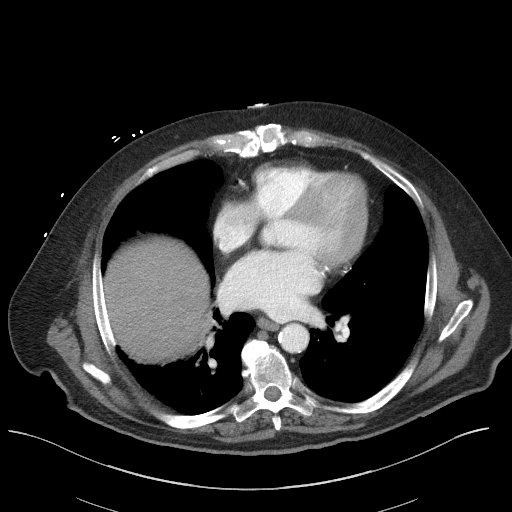

[Series 5: a/p w/ cor · coronal · 0.87mm/px · 3 of 165 slices shown]
[im 55/165  soft-tissue]
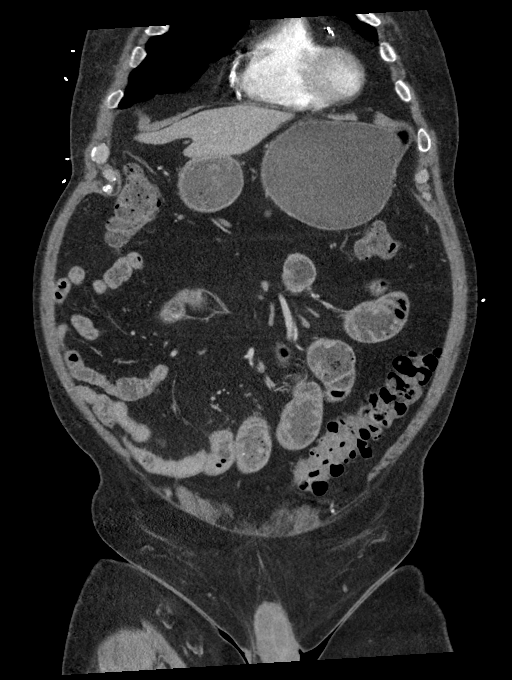
[im 73/165  soft-tissue]
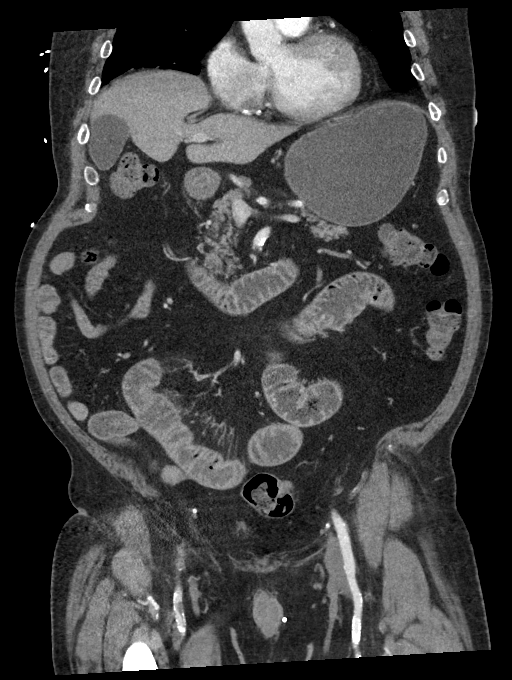
[im 92/165  soft-tissue]
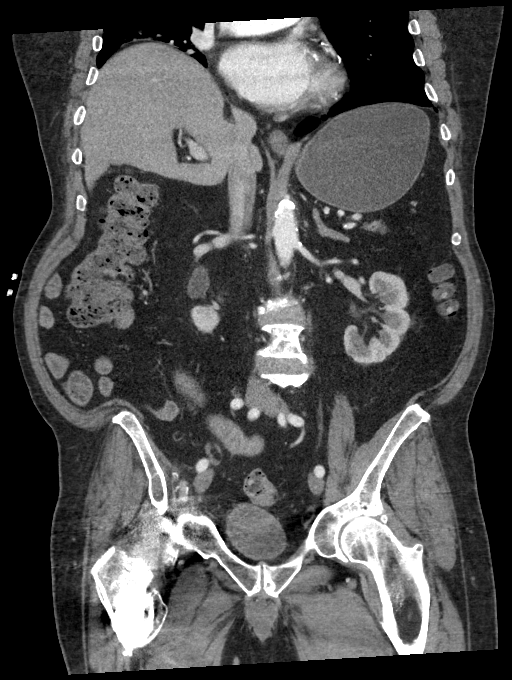

[14 of 46 positions shown; findings below may reference images not displayed]

FINDINGS: Lower chest: Right lung base subsegmental atelectasis versus
pneumonia. There are bilateral lower lobe pulmonary artery emboli
which appear linear in morphology and similar to the PE seen on the
chest CT dated 04/10/2014. There is coronary vascular calcification
and CABG surgery.

No intra-abdominal free air or free fluid.

Hepatobiliary: Small stones within the gallbladder fundus. No
pericholecystic fluid. Probable mild fatty infiltration of the
liver. No biliary ductal dilatation.

Pancreas: There multiple hypodense lesions in the pancreas measuring
up to 1.5 cm in the proximal body of the pancreas. These lesions are
grossly similar to the lesion seen on the prior CT of 5852 further
evaluation with MRI without and with contrast, if not performed,
recommended. There is no dilatation of the main pancreatic duct. No
active pancreatic inflammatory changes.

Spleen: Normal in size without focal abnormality.

Adrenals/Urinary Tract: The adrenal glands appear unremarkable.
There is mild bilateral renal atrophy. Multiple right renal calculi
noted. There is a 1 cm stone in the right renal pelvis with mild
right hydronephrosis. Multiple smaller right renal inferior pole
calculi noted. There is no hydronephrosis or nephrolithiasis on the
left. The 1.5 cm left renal inferior pole hypodense lesion appears
similar to prior study but incompletely characterized on this CT.
This most likely represents a cyst. Further evaluation with
nonemergent ultrasound recommended. The visualized ureters and
urinary bladder appear unremarkable.

Stomach/Bowel: There is sigmoid diverticulosis and scattered colonic
diverticula without active inflammatory changes. Multiple mildly
dilated loops of small bowel in the mid to lower abdomen noted.
There is segmental thickening of the small bowel in the lower
abdomen and right hemipelvis likely representing enteritis with
associated ileus or low-grade/ with transition zone in the right
hemipelvis. The distal small bowel and terminal ileum are
decompressed. Normal appendix.

Vascular/Lymphatic: There is moderate aortoiliac atherosclerotic
disease. There is heavy atherosclerotic calcification of the origins
of the celiac axis, SMA, and IMA. The origins of the these vessels
however remain patent. No portal venous gas identified. There is no
adenopathy.

Reproductive: The prostate and seminal vesicles are grossly
unremarkable.

Other: None

Musculoskeletal: Right hip arthroplasty and for ectopic bone
formation. Osteoarthritic changes of the left hip. Multilevel
degenerative changes of the spine with osteophyte formation. L4-L5
spinous process fixation hardware. No acute fracture. Postsurgical
changes of the lower lumbar spine. There is laminectomy at L2. A
x 1.7 cm fluid collection in the posterior paraspinal soft tissues
in the midline at this level noted.
IMPRESSION: Findings likely represent enteritis with segmental ileus versus an
early small-bowel obstruction with transition zone in the right
hemipelvis. Clinical correlation and follow-up recommended.

Cholelithiasis.

Right renal calculi with mild right hydronephrosis.

Stable appearing hypodense pancreatic lesions. Further evaluation
with nonemergent MRI without and with contrast recommended.

## 2016-10-16 ENCOUNTER — Other Ambulatory Visit: Payer: Self-pay | Admitting: Cardiovascular Disease

## 2016-10-17 NOTE — Progress Notes (Signed)
Patient ID: Carlos Herman, male   DOB: 28-Nov-1940, 76 y.o.   MRN: [id number]    Cardiology Office Note   Date:  10/20/2016   ID:  Carlos Herman, DOB 05-Nov-1940, MRN [id number]  PCP:  Kandice Hams, MD  Cardiologist:  Dr. Jenkins Rouge     Chief Complaint  Patient presents with  . Coronary Artery Disease     History of Present Illness: Carlos Herman is a 76 y.o. male with a hx of CAD s/p CABG in 1996, DM, HTN, HLD.  Cath in 2009 with patent RCA with diffuse stenting, occluded Circumflex and LAD with patent SVG to Diagonal filling LAD but occluded SVG to OM and SVG to RCA. He has been on anti-coagulation with Eliquis for bilateral PE dx'd in October 2015.  Patient was seen in the office in December 2015 for chest pain c/w angina and set up for cardiac cath.  LHC revealed significant disease in the native RCA. He underwent PCI with DES to the mid RCA and cutting balloon to the distal RCA. The was placed on Eliquis and Plavix for a year- no ASA.  Last seen by PA 2/16 .  Complaints of dizziness.  BP reportedly in 160/70s.  He was advised to hold Ramipril and to FU today (needs BMET, CBC).  The patient notes dizziness/lightheadedness .  It seems to be hard for him to describe.  He states it is like a "buzzing feeling."  It seems fairly constant.  But, he tends to notice it more when he is upright and moving around.  He denies a spinning sensation.  He denies near syncope or syncope.  He denies any change with change in head position or use of his upper extremities.  He denies chest pain or significant dyspnea.  This remains improved since his PCI.  He is adherent to his medications.  He denies orthopnea, PND.  He has chronic LE edema on the R related to prior injuries/surgeries.  He denies any significant change.  Actually, he feels this has improved since he had his recent PCI.    Ramapril stopped and BP has not been too high Holter reviewed and PAC/PVC no afib Carotid reviewed 08/19/14  and normal with left dominant vertebral  ON eliquis for previous PE 2015 stop in October 2016  No angina busy doing major Spring races at [place] Major limitation to activity is right knee pain post TKR   03/24/16 admitted with partial SBO.  Had SSCP and elevated troponin:   Cath Dr Patsy Lager 03/25/16 Interestingly previously atretic LIMA now normal with subtotal occlusion of SVG D1 at distal anastamosis Also SVG OM1/OM2 thought to be previously occluded with severe stenosis with DES to proximal and  Distal aspects of graft. Still has diffuse distal LAD/RCA disease     Past Medical History:  Diagnosis Date  . CAD (coronary artery disease)    a. s/p CABG; b. s/p diffuse stenting to RCA; c. 3/4 grafts known to be occluded;  d.  LHC 12/15:  pLAD 100, small CFx diff disz - no amenable to PCI, RCA severe mid and dist ISR, S-OM, S-RCA 100, L-LAD atretic, S-Dx ok (dist seg of Dx 100) >> PCI:  DES to mRCA and POBA/cutting balloon to dist RCA   . Depression   . DM (diabetes mellitus) ([place])    Type 2  . Dyslipidemia   . Hemorrhoids    internal  . History of Doppler ultrasound    Carotid US (2/16):  Normal carotid arteries bilaterally.  Marland Kitchen History of hiatal hernia   . History of kidney stones   . History of pneumonia   . History of seizures    "when I was hospitalized for stroke"  . Hx of echocardiogram    a. Echo 10/15:  mild LVH, EF 55-60%, no RWMA, Ao root 39 mm, mild MR, mild LAE  . Hypertension   . Leg edema    right  . Myocardial infarction ([place])   . Nephrolithiasis   . Nocturia   . Obesity    morbid  . Osteoarthritis   . Pulmonary embolism ([place])    dx 03/2014 >> On Apixaban  . Stroke ([place]) \   no deficits  . Wears glasses     Past Surgical History:  Procedure Laterality Date  . ANGIOPLASTY / STENTING ILIAC    . BACK SURGERY     x2  . CARDIAC CATHETERIZATION N/A 03/25/2016   Procedure: Left Heart Cath and Cors/Grafts Angiography;  Surgeon: Azaliah Carrero M Martinique, MD;  Location: [place] [facility];  Service: Cardiovascular;  Laterality: N/A;  . CARDIAC CATHETERIZATION N/A 03/25/2016   Procedure: Coronary Stent Intervention;  Surgeon: Aleesia Henney M Martinique, MD;  Location: [place] [facility];  Service: Cardiovascular;  Laterality: N/A;  . COLONOSCOPY    . Heart Bypass  1996  . LEFT HEART CATHETERIZATION WITH CORONARY/GRAFT ANGIOGRAM N/A 06/16/2014   Procedure: LEFT HEART CATHETERIZATION WITH Beatrix Fetters;  Surgeon: Burnell Blanks, MD;  Location: [facility] CATH LAB;  Service: Cardiovascular;  Laterality: N/A;  . LUMBAR LAMINECTOMY/DECOMPRESSION MICRODISCECTOMY N/A 02/06/2016   Procedure: Laminectomy and Foraminotomy - Lumbar one-Lumbar two;  Surgeon: Eustace Moore, MD;  Location: [place] NEURO ORS;  Service: Neurosurgery;  Laterality: N/A;  . PERCUTANEOUS CORONARY STENT INTERVENTION (PCI-S)  06/16/2014   Procedure: PERCUTANEOUS CORONARY STENT INTERVENTION (PCI-S);  Surgeon: Burnell Blanks, MD;  Location: [facility] CATH LAB;  Service: Cardiovascular;;  MID RCA, Cutting Balloon and Stent Distal RCA, Cutting Balloon   . TOTAL KNEE ARTHROPLASTY     right  . UMBILICAL HERNIA REPAIR       Current Outpatient Prescriptions  Medication Sig Dispense Refill  . amLODipine (NORVASC) 10 MG tablet take 1 tablet by mouth once daily 90 tablet 3  . aspirin EC 81 MG tablet Take 81 mg by mouth daily.    . clopidogrel (PLAVIX) 75 MG tablet take 1 tablet by mouth once daily WITH BREAKFAST 90 tablet 3  . isosorbide mononitrate (IMDUR) 60 MG 24 hr tablet take 1 tablet by mouth at bedtime 90 tablet 3  . metFORMIN (GLUCOPHAGE) 500 MG tablet Take 2 tablets (1,000 mg total) by mouth 2 (two) times daily with a meal.    . metoprolol succinate (TOPROL-XL) 100 MG 24 hr tablet Take 1 tablet by mouth every morning and 1/2 tablet by mouth every evening (TAKE WITH OR IMMEDIATELY AFTER MEALS) 135 tablet 1  . Multiple Minerals-Vitamins (CAL MAG ZINC +D3 PO) Take 1 tablet by mouth 2 (two) times daily. Per 3  tablets; Calcium 1000 mg, magnesium 400 mg, zinc 25 mg, vitamin D3 600 I.U.    . nitroGLYCERIN (NITROSTAT) 0.4 MG SL tablet Place 1 tablet (0.4 mg total) under the tongue every 5 (five) minutes as needed for chest pain. 25 tablet 4  . oxyCODONE-acetaminophen (PERCOCET) 10-325 MG tablet Take 1 tablet by mouth 2 (two) times daily.  0   No current facility-administered medications for this visit.     Allergies:  Amlodipine besylate; Penicillins; and Statins    Social History:  The patient  reports that he has never smoked. He has never used smokeless tobacco. He reports that he drinks alcohol. He reports that he does not use drugs.   Family History:  The patient's family history includes Diabetes in his mother; Stroke in his mother.    ROS:  Please see the history of present illness.   He fell recently and bruised his coccyx.  He was seen by Dr. Nicholaus Bloom (pain doctor) and did not require xrays.  He says he tripped - R leg is weak related to prior injuries/surgeries.  Otherwise, review of systems are positive for none.   All other systems are reviewed and negative.    PHYSICAL EXAM: VS:  BP 140/60   Pulse 63   Ht 5\' 9"  (1.753 m)   Wt 218 lb (98.9 kg)   SpO2 97%   BMI 32.19 kg/m    Repeat BP by me:  L 126/60;  R 130/60  No data found.   Wt Readings from Last 3 Encounters:  10/20/16 218 lb (98.9 kg)  04/18/16 224 lb 12.8 oz (102 kg)  03/26/16 220 lb 7.4 oz (100 kg)     GEN: Obese white male  HEENT: normal no obvious nystagmus with lying flat Neck: JVP normal  Right  bruit, no masses Cardiac:  Normal I6/O0, RRR; 1/6 systolic murmur LLSB, no rubs or gallops, 1+ RLE edema, trace LLE edema  Respiratory:  clear to auscultation bilaterally, no wheezing, rhonchi or rales. GI: soft, nontender, nondistended, + BS MS: no deformity or atrophy  Skin: warm and dry  Neuro:  CNs II-XII intact, Strength and sensation are intact Psych: Normal affect Post right TKR    EKG:  08/10/14     NSR, HR 69, normal axis, PACs, no change from prior tracing. Done 07/16/14  09/21/15  SR rate 69 PVC    Recent Labs: 03/26/2016: ALT 11; BUN 6; Creatinine, Ser 0.94; Hemoglobin 11.8; Platelets 273; Potassium 3.8; Sodium 137     ASSESSMENT AND PLAN:  1.  Dizziness:  Resolved off ramapril    -  Holter PAC;s PVC;s no PAF      -  Carotids 07/2014 no disease . 2.  Coronary artery disease: CABG 1996 Multiple patent stents in RCA graft and patent LIMA 9/26 multiple DES To proximal and distal OM1/OM2 graft that was previously thought to be occluded DAT 3.  Essential hypertension:  BP at target by my check.  As noted, remain off of Ramipril for now and follow BP 4.  Pulmonary embolus- Oct 2015:  Now off eliquis on plavix for CAD  5. Ortho:  Should see pain management f/u post TKR on right  6. SBO:  CT 03/24/16 enteritis early SBO improved hypodense pancreatic lesions ? F/u MRI   Current medicines are reviewed at length with the patient today.  The patient does not have concerns regarding medicines.  The following changes have been made:  As above.   Labs/ tests ordered today include:  No orders of the defined types were placed in this encounter.   Disposition:   FU with  Me in 6 months    Jenkins Rouge

## 2016-10-20 ENCOUNTER — Ambulatory Visit (INDEPENDENT_AMBULATORY_CARE_PROVIDER_SITE_OTHER): Payer: Medicare Other | Admitting: Cardiovascular Disease

## 2016-10-20 ENCOUNTER — Encounter (INDEPENDENT_AMBULATORY_CARE_PROVIDER_SITE_OTHER): Payer: Self-pay

## 2016-10-20 ENCOUNTER — Encounter: Payer: Self-pay | Admitting: Cardiovascular Disease

## 2016-10-20 VITALS — BP 140/60 | HR 63 | Ht 69.0 in | Wt 218.0 lb

## 2016-10-20 DIAGNOSIS — I2583 Coronary atherosclerosis due to lipid rich plaque: Secondary | ICD-10-CM | POA: Diagnosis not present

## 2016-10-20 DIAGNOSIS — I2510 Atherosclerotic heart disease of native coronary artery without angina pectoris: Secondary | ICD-10-CM | POA: Diagnosis not present

## 2016-10-20 NOTE — Patient Instructions (Signed)

## 2016-11-17 DIAGNOSIS — M5416 Radiculopathy, lumbar region: Secondary | ICD-10-CM | POA: Diagnosis not present

## 2016-11-17 DIAGNOSIS — M150 Primary generalized (osteo)arthritis: Secondary | ICD-10-CM | POA: Diagnosis not present

## 2016-11-17 DIAGNOSIS — G894 Chronic pain syndrome: Secondary | ICD-10-CM | POA: Diagnosis not present

## 2016-11-17 DIAGNOSIS — G5721 Lesion of femoral nerve, right lower limb: Secondary | ICD-10-CM | POA: Diagnosis not present

## 2016-12-15 DIAGNOSIS — G5721 Lesion of femoral nerve, right lower limb: Secondary | ICD-10-CM | POA: Diagnosis not present

## 2016-12-15 DIAGNOSIS — M5416 Radiculopathy, lumbar region: Secondary | ICD-10-CM | POA: Diagnosis not present

## 2016-12-15 DIAGNOSIS — M150 Primary generalized (osteo)arthritis: Secondary | ICD-10-CM | POA: Diagnosis not present

## 2016-12-15 DIAGNOSIS — G894 Chronic pain syndrome: Secondary | ICD-10-CM | POA: Diagnosis not present

## 2016-12-25 DIAGNOSIS — Z7984 Long term (current) use of oral hypoglycemic drugs: Secondary | ICD-10-CM | POA: Diagnosis not present

## 2016-12-25 DIAGNOSIS — E1122 Type 2 diabetes mellitus with diabetic chronic kidney disease: Secondary | ICD-10-CM | POA: Diagnosis not present

## 2016-12-25 DIAGNOSIS — E0851 Diabetes mellitus due to underlying condition with diabetic peripheral angiopathy without gangrene: Secondary | ICD-10-CM | POA: Diagnosis not present

## 2017-01-27 DIAGNOSIS — M5416 Radiculopathy, lumbar region: Secondary | ICD-10-CM | POA: Diagnosis not present

## 2017-01-27 DIAGNOSIS — M150 Primary generalized (osteo)arthritis: Secondary | ICD-10-CM | POA: Diagnosis not present

## 2017-01-27 DIAGNOSIS — G5721 Lesion of femoral nerve, right lower limb: Secondary | ICD-10-CM | POA: Diagnosis not present

## 2017-01-27 DIAGNOSIS — G894 Chronic pain syndrome: Secondary | ICD-10-CM | POA: Diagnosis not present

## 2017-02-16 DIAGNOSIS — H16252 Phlyctenular keratoconjunctivitis, left eye: Secondary | ICD-10-CM | POA: Diagnosis not present

## 2017-03-06 DIAGNOSIS — E119 Type 2 diabetes mellitus without complications: Secondary | ICD-10-CM | POA: Diagnosis not present

## 2017-03-21 ENCOUNTER — Other Ambulatory Visit: Payer: Self-pay | Admitting: Cardiovascular Disease

## 2017-03-27 DIAGNOSIS — G894 Chronic pain syndrome: Secondary | ICD-10-CM | POA: Diagnosis not present

## 2017-03-27 DIAGNOSIS — M47816 Spondylosis without myelopathy or radiculopathy, lumbar region: Secondary | ICD-10-CM | POA: Diagnosis not present

## 2017-03-27 DIAGNOSIS — M5416 Radiculopathy, lumbar region: Secondary | ICD-10-CM | POA: Diagnosis not present

## 2017-03-27 DIAGNOSIS — M150 Primary generalized (osteo)arthritis: Secondary | ICD-10-CM | POA: Diagnosis not present

## 2017-03-27 DIAGNOSIS — G5721 Lesion of femoral nerve, right lower limb: Secondary | ICD-10-CM | POA: Diagnosis not present

## 2017-03-27 DIAGNOSIS — Z79891 Long term (current) use of opiate analgesic: Secondary | ICD-10-CM | POA: Diagnosis not present

## 2017-04-06 DIAGNOSIS — M545 Low back pain: Secondary | ICD-10-CM | POA: Diagnosis not present

## 2017-04-06 DIAGNOSIS — Z1389 Encounter for screening for other disorder: Secondary | ICD-10-CM | POA: Diagnosis not present

## 2017-04-06 DIAGNOSIS — I739 Peripheral vascular disease, unspecified: Secondary | ICD-10-CM | POA: Diagnosis not present

## 2017-04-06 DIAGNOSIS — E0851 Diabetes mellitus due to underlying condition with diabetic peripheral angiopathy without gangrene: Secondary | ICD-10-CM | POA: Diagnosis not present

## 2017-04-06 DIAGNOSIS — Z0000 Encounter for general adult medical examination without abnormal findings: Secondary | ICD-10-CM | POA: Diagnosis not present

## 2017-04-06 DIAGNOSIS — I25118 Atherosclerotic heart disease of native coronary artery with other forms of angina pectoris: Secondary | ICD-10-CM | POA: Diagnosis not present

## 2017-04-06 DIAGNOSIS — M899 Disorder of bone, unspecified: Secondary | ICD-10-CM | POA: Diagnosis not present

## 2017-04-06 DIAGNOSIS — Q453 Other congenital malformations of pancreas and pancreatic duct: Secondary | ICD-10-CM | POA: Diagnosis not present

## 2017-04-06 DIAGNOSIS — I10 Essential (primary) hypertension: Secondary | ICD-10-CM | POA: Diagnosis not present

## 2017-04-06 DIAGNOSIS — E7800 Pure hypercholesterolemia, unspecified: Secondary | ICD-10-CM | POA: Diagnosis not present

## 2017-04-06 DIAGNOSIS — E1165 Type 2 diabetes mellitus with hyperglycemia: Secondary | ICD-10-CM | POA: Diagnosis not present

## 2017-04-15 ENCOUNTER — Other Ambulatory Visit: Payer: Self-pay | Admitting: Cardiovascular Disease

## 2017-04-20 ENCOUNTER — Emergency Department (HOSPITAL_COMMUNITY): Payer: Medicare Other

## 2017-04-20 ENCOUNTER — Encounter (HOSPITAL_COMMUNITY): Payer: Self-pay

## 2017-04-20 ENCOUNTER — Inpatient Hospital Stay (HOSPITAL_COMMUNITY)
Admission: EM | Admit: 2017-04-20 | Discharge: 2017-04-22 | DRG: 247 | Disposition: A | Discharge status: Home / Self Care | Payer: Medicare Other | Attending: Cardiology | Admitting: Cardiology

## 2017-04-20 DIAGNOSIS — Z888 Allergy status to other drugs, medicaments and biological substances status: Secondary | ICD-10-CM

## 2017-04-20 DIAGNOSIS — E785 Hyperlipidemia, unspecified: Secondary | ICD-10-CM

## 2017-04-20 DIAGNOSIS — Z7902 Long term (current) use of antithrombotics/antiplatelets: Secondary | ICD-10-CM

## 2017-04-20 DIAGNOSIS — I252 Old myocardial infarction: Secondary | ICD-10-CM

## 2017-04-20 DIAGNOSIS — I200 Unstable angina: Secondary | ICD-10-CM

## 2017-04-20 DIAGNOSIS — Z86711 Personal history of pulmonary embolism: Secondary | ICD-10-CM

## 2017-04-20 DIAGNOSIS — Z880 Allergy status to penicillin: Secondary | ICD-10-CM | POA: Diagnosis not present

## 2017-04-20 DIAGNOSIS — Z6832 Body mass index (BMI) 32.0-32.9, adult: Secondary | ICD-10-CM

## 2017-04-20 DIAGNOSIS — I25110 Atherosclerotic heart disease of native coronary artery with unstable angina pectoris: Secondary | ICD-10-CM

## 2017-04-20 DIAGNOSIS — R0602 Shortness of breath: Secondary | ICD-10-CM | POA: Diagnosis not present

## 2017-04-20 DIAGNOSIS — I214 Non-ST elevation (NSTEMI) myocardial infarction: Principal | ICD-10-CM | POA: Diagnosis present

## 2017-04-20 DIAGNOSIS — I25810 Atherosclerosis of coronary artery bypass graft(s) without angina pectoris: Secondary | ICD-10-CM | POA: Diagnosis not present

## 2017-04-20 DIAGNOSIS — Z8673 Personal history of transient ischemic attack (TIA), and cerebral infarction without residual deficits: Secondary | ICD-10-CM

## 2017-04-20 DIAGNOSIS — R079 Chest pain, unspecified: Secondary | ICD-10-CM | POA: Diagnosis not present

## 2017-04-20 DIAGNOSIS — I491 Atrial premature depolarization: Secondary | ICD-10-CM | POA: Diagnosis not present

## 2017-04-20 DIAGNOSIS — I10 Essential (primary) hypertension: Secondary | ICD-10-CM | POA: Diagnosis not present

## 2017-04-20 DIAGNOSIS — Z7982 Long term (current) use of aspirin: Secondary | ICD-10-CM

## 2017-04-20 DIAGNOSIS — Z7984 Long term (current) use of oral hypoglycemic drugs: Secondary | ICD-10-CM

## 2017-04-20 DIAGNOSIS — I2510 Atherosclerotic heart disease of native coronary artery without angina pectoris: Secondary | ICD-10-CM | POA: Diagnosis present

## 2017-04-20 DIAGNOSIS — Z96651 Presence of right artificial knee joint: Secondary | ICD-10-CM | POA: Diagnosis present

## 2017-04-20 DIAGNOSIS — E119 Type 2 diabetes mellitus without complications: Secondary | ICD-10-CM | POA: Diagnosis present

## 2017-04-20 DIAGNOSIS — E1121 Type 2 diabetes mellitus with diabetic nephropathy: Secondary | ICD-10-CM | POA: Diagnosis not present

## 2017-04-20 DIAGNOSIS — Z79899 Other long term (current) drug therapy: Secondary | ICD-10-CM

## 2017-04-20 DIAGNOSIS — F329 Major depressive disorder, single episode, unspecified: Secondary | ICD-10-CM | POA: Diagnosis present

## 2017-04-20 DIAGNOSIS — E669 Obesity, unspecified: Secondary | ICD-10-CM | POA: Diagnosis present

## 2017-04-20 DIAGNOSIS — Z955 Presence of coronary angioplasty implant and graft: Secondary | ICD-10-CM

## 2017-04-20 HISTORY — DX: Unspecified convulsions: R56.9

## 2017-04-20 HISTORY — DX: Pneumonia, unspecified organism: J18.9

## 2017-04-20 LAB — CBC
HEMATOCRIT: 41.6 % (ref 39.0–52.0)
Hemoglobin: 14 g/dL (ref 13.0–17.0)
MCH: 31.7 pg (ref 26.0–34.0)
MCHC: 33.7 g/dL (ref 30.0–36.0)
MCV: 94.3 fL (ref 78.0–100.0)
Platelets: 225 10*3/uL (ref 150–400)
RBC: 4.41 MIL/uL (ref 4.22–5.81)
RDW: 12.4 % (ref 11.5–15.5)
WBC: 9.3 10*3/uL (ref 4.0–10.5)

## 2017-04-20 LAB — CBC WITH DIFFERENTIAL/PLATELET
BASOS ABS: 0 10*3/uL (ref 0.0–0.1)
Basophils Relative: 0 %
EOS PCT: 2 %
Eosinophils Absolute: 0.1 10*3/uL (ref 0.0–0.7)
HCT: 40.5 % (ref 39.0–52.0)
Hemoglobin: 13.2 g/dL (ref 13.0–17.0)
LYMPHS ABS: 2 10*3/uL (ref 0.7–4.0)
LYMPHS PCT: 24 %
MCH: 31.1 pg (ref 26.0–34.0)
MCHC: 32.6 g/dL (ref 30.0–36.0)
MCV: 95.5 fL (ref 78.0–100.0)
MONO ABS: 0.7 10*3/uL (ref 0.1–1.0)
MONOS PCT: 8 %
NEUTROS ABS: 5.5 10*3/uL (ref 1.7–7.7)
Neutrophils Relative %: 66 %
PLATELETS: 231 10*3/uL (ref 150–400)
RBC: 4.24 MIL/uL (ref 4.22–5.81)
RDW: 12.7 % (ref 11.5–15.5)
WBC: 8.3 10*3/uL (ref 4.0–10.5)

## 2017-04-20 LAB — BASIC METABOLIC PANEL
ANION GAP: 9 (ref 5–15)
BUN: 14 mg/dL (ref 6–20)
CALCIUM: 9 mg/dL (ref 8.9–10.3)
CO2: 22 mmol/L (ref 22–32)
Chloride: 105 mmol/L (ref 101–111)
Creatinine, Ser: 1.09 mg/dL (ref 0.61–1.24)
GFR calc Af Amer: >60 mL/min (ref >60)
GFR calc non Af Amer: >60 mL/min (ref >60)
GLUCOSE: 169 mg/dL — AB (ref 65–99)
Potassium: 4.6 mmol/L (ref 3.5–5.1)
Sodium: 136 mmol/L (ref 135–145)

## 2017-04-20 LAB — I-STAT TROPONIN, ED
TROPONIN I, POC: 0.18 ng/mL — AB (ref 0.00–0.08)
Troponin i, poc: 0.04 ng/mL (ref 0.00–0.08)

## 2017-04-20 LAB — CREATININE, SERUM
Creatinine, Ser: 0.91 mg/dL (ref 0.61–1.24)
GFR calc Af Amer: >60 mL/min (ref >60)
GFR calc non Af Amer: >60 mL/min (ref >60)

## 2017-04-20 LAB — CBG MONITORING, ED: Glucose-Capillary: 122 mg/dL — ABNORMAL HIGH (ref 65–99)

## 2017-04-20 LAB — BRAIN NATRIURETIC PEPTIDE: B Natriuretic Peptide: 260.1 pg/mL — ABNORMAL HIGH (ref 0.0–100.0)

## 2017-04-20 MED ORDER — NITROGLYCERIN IN D5W 200-5 MCG/ML-% IV SOLN
0.0000 ug/min | INTRAVENOUS | Status: DC
  Administered 2017-04-20: 5 ug/min via INTRAVENOUS
  Filled 2017-04-20: qty 250

## 2017-04-20 MED ORDER — METOPROLOL SUCCINATE ER 50 MG PO TB24
100.0000 mg | ORAL_TABLET | Freq: Every day | ORAL | Status: DC
  Administered 2017-04-22: 09:00:00 100 mg via ORAL
  Filled 2017-04-20: qty 2
  Filled 2017-04-20: qty 1

## 2017-04-20 MED ORDER — HEPARIN (PORCINE) IN NACL 100-0.45 UNIT/ML-% IJ SOLN
1500.0000 [IU]/h | INTRAMUSCULAR | Status: DC
  Administered 2017-04-20: 1050 [IU]/h via INTRAVENOUS
  Administered 2017-04-21: 1500 [IU]/h via INTRAVENOUS
  Filled 2017-04-20 (×2): qty 250

## 2017-04-20 MED ORDER — INSULIN ASPART 100 UNIT/ML ~~LOC~~ SOLN
0.0000 [IU] | Freq: Three times a day (TID) | SUBCUTANEOUS | Status: DC
  Administered 2017-04-21 – 2017-04-22 (×2): 1 [IU] via SUBCUTANEOUS

## 2017-04-20 MED ORDER — AMLODIPINE BESYLATE 5 MG PO TABS
10.0000 mg | ORAL_TABLET | Freq: Every evening | ORAL | Status: DC
  Administered 2017-04-20: 10 mg via ORAL
  Filled 2017-04-20: qty 2

## 2017-04-20 MED ORDER — OXYCODONE-ACETAMINOPHEN 10-325 MG PO TABS: 1.0000 | ORAL_TABLET | Freq: Two times a day (BID) | ORAL | Status: DC

## 2017-04-20 MED ORDER — ASPIRIN 81 MG PO CHEW
243.0000 mg | CHEWABLE_TABLET | Freq: Once | ORAL | Status: AC
  Administered 2017-04-20: 243 mg via ORAL
  Filled 2017-04-20: qty 3

## 2017-04-20 MED ORDER — NITROGLYCERIN 0.4 MG SL SUBL
0.4000 mg | SUBLINGUAL_TABLET | Freq: Once | SUBLINGUAL | Status: DC | PRN
  Filled 2017-04-20: qty 1

## 2017-04-20 MED ORDER — ISOSORBIDE MONONITRATE ER 60 MG PO TB24
60.0000 mg | ORAL_TABLET | Freq: Every day | ORAL | Status: DC
  Administered 2017-04-20 – 2017-04-21 (×2): 60 mg via ORAL
  Filled 2017-04-20: qty 2
  Filled 2017-04-20: qty 1

## 2017-04-20 MED ORDER — HEPARIN BOLUS VIA INFUSION
4000.0000 [IU] | Freq: Once | INTRAVENOUS | Status: AC
  Administered 2017-04-20: 4000 [IU] via INTRAVENOUS
  Filled 2017-04-20: qty 4000

## 2017-04-20 MED ORDER — OXYCODONE-ACETAMINOPHEN 5-325 MG PO TABS
1.0000 | ORAL_TABLET | Freq: Two times a day (BID) | ORAL | Status: DC
  Administered 2017-04-20: 1 via ORAL
  Filled 2017-04-20: qty 1

## 2017-04-20 MED ORDER — INSULIN ASPART 100 UNIT/ML ~~LOC~~ SOLN: 0.0000 [IU] | Freq: Every day | SUBCUTANEOUS | Status: DC

## 2017-04-20 MED ORDER — ASPIRIN EC 81 MG PO TBEC: 81.0000 mg | DELAYED_RELEASE_TABLET | Freq: Every day | ORAL | Status: DC

## 2017-04-20 MED ORDER — CLOPIDOGREL BISULFATE 75 MG PO TABS: 75.0000 mg | ORAL_TABLET | Freq: Every day | ORAL | Status: DC

## 2017-04-20 MED ORDER — METOPROLOL SUCCINATE ER 50 MG PO TB24
50.0000 mg | ORAL_TABLET | Freq: Every evening | ORAL | Status: DC
  Administered 2017-04-20 – 2017-04-21 (×2): 50 mg via ORAL
  Filled 2017-04-20 (×2): qty 1

## 2017-04-20 MED ORDER — AMLODIPINE BESYLATE 10 MG PO TABS
10.0000 mg | ORAL_TABLET | Freq: Every evening | ORAL | Status: DC
  Administered 2017-04-21: 10 mg via ORAL
  Filled 2017-04-20: qty 1

## 2017-04-20 MED ORDER — OXYCODONE HCL 5 MG PO TABS
5.0000 mg | ORAL_TABLET | Freq: Two times a day (BID) | ORAL | Status: DC
Start: 2017-04-20 — End: 2017-04-21
  Administered 2017-04-20: 5 mg via ORAL
  Filled 2017-04-20: qty 1

## 2017-04-20 MED ORDER — ACETAMINOPHEN 325 MG PO TABS: 650.0000 mg | ORAL_TABLET | ORAL | Status: DC | PRN

## 2017-04-20 MED ORDER — NITROGLYCERIN 0.4 MG SL SUBL: 0.4000 mg | SUBLINGUAL_TABLET | SUBLINGUAL | Status: DC | PRN

## 2017-04-20 MED ORDER — ONDANSETRON HCL 4 MG/2ML IJ SOLN: 4.0000 mg | Freq: Four times a day (QID) | INTRAMUSCULAR | Status: DC | PRN

## 2017-04-20 NOTE — ED Triage Notes (Signed)
Pt states he began having chest pain this morning upon awakening. He states he took 3 NTG tablets. Pt chest pain free at this time. Denies any associated symptoms.

## 2017-04-20 NOTE — ED Notes (Signed)
Cardiology NP at bedside.

## 2017-04-20 NOTE — Progress Notes (Signed)
ANTICOAGULATION CONSULT NOTE - Initial Consult  Pharmacy Consult for Heparin Indication: chest pain/ACS  Allergies  Allergen Reactions  . Amlodipine Besylate Other (See Comments)    Please use [employer] brand amlodipine for pt - states he becomes aggressive/agitated and refuses other manufacturers  . Penicillins Swelling and Other (See Comments)    Redness Has patient had a PCN reaction causing immediate rash, facial/tongue/throat swelling, SOB or lightheadedness with hypotension: No Has patient had a PCN reaction causing severe rash involving mucus membranes or skin necrosis: No Has patient had a PCN reaction that required hospitalization No Has patient had a PCN reaction occurring within the last 10 years: Non If all of the above answers are "NO", then may proceed with Cephalosporin use.   . Statins Other (See Comments)      muscle pain    Patient Measurements: Height: 5\' 9"  (175.3 cm) Weight: 219 lb (99.3 kg) IBW/kg (Calculated) : 70.7 Heparin Dosing Weight: 91 kg   Vital Signs: Temp: 97.8 F (36.6 C) (10/22 0835) Temp Source: Oral (10/22 0835) BP: 125/67 (10/22 1000) Pulse Rate: 70 (10/22 1000)  Labs:  Recent Labs  04/20/17 0951  HGB 13.2  HCT 40.5  PLT 231  CREATININE 1.09    Estimated Creatinine Clearance: 68 mL/min (by C-G formula based on SCr of 1.09 mg/dL).   Medical History: Past Medical History:  Diagnosis Date  . CAD (coronary artery disease)    a. s/p CABG; b. s/p diffuse stenting to RCA; c. 3/4 grafts known to be occluded;  d.  LHC 12/15:  pLAD 100, small CFx diff disz - no amenable to PCI, RCA severe mid and dist ISR, S-OM, S-RCA 100, L-LAD atretic, S-Dx ok (dist seg of Dx 100) >> PCI:  DES to mRCA and POBA/cutting balloon to dist RCA   . Depression   . DM (diabetes mellitus) ([place])    Type 2  . Dyslipidemia   . Hemorrhoids    internal  . History of Doppler ultrasound    Carotid US (2/16):  Normal carotid arteries bilaterally.  Marland Kitchen History of  hiatal hernia   . History of kidney stones   . History of pneumonia   . History of seizures    "when I was hospitalized for stroke"  . Hx of echocardiogram    a. Echo 10/15:  mild LVH, EF 55-60%, no RWMA, Ao root 39 mm, mild MR, mild LAE  . Hypertension   . Leg edema    right  . Myocardial infarction ([place])   . Nephrolithiasis   . Nocturia   . Obesity    morbid  . Osteoarthritis   . Pulmonary embolism ([place])    dx 03/2014 >> On Apixaban  . Stroke ([place]) \   no deficits  . Wears glasses     Medications:   (Not in a hospital admission)  Assessment: 89 YOM with h/o CAD, HTN, HLD, DM, PE here with chest pain. Initial troponin negative. Pharmacy consulted to start IV heparin for ACS and possible cardiac cath. H/H and Plt wnl. Patient is not on any anticoagulation at home  . Goal of Therapy:  Heparin level 0.3-0.7 units/ml Monitor platelets by anticoagulation protocol: Yes   Plan:  -Heparin 4000 units IV once, then heparin infusion at 1050 units/hr -F/u 8 hr HL -Monitor daily HL, CBC and s/s of bleeding  Albertina Parr, PharmD., BCPS Clinical Pharmacist Pager [phone]

## 2017-04-20 NOTE — H&P (Signed)
Cardiology Admission History and Physical:   Patient ID: Carlos Herman; MRN: [id number]; DOB: [birth year]   Admission date: 04/20/2017  Primary Care Provider: Seward Carol, MD Primary Cardiologist: Dr. Johnsie Cancel  Chief Complaint:  Chest  Patient Profile:   Carlos Herman is a 76 y.o. male with a history of Carlos Herman is a 76 y.o. male with a hx of CAD s/p CABG 1996, HTN, HLD, DM, hx bil PE 2015 who is being seen today for the evaluation of chest pain at the request of Dr. Regenia Skeeter.  History of Present Illness:   Carlos Herman is s/p CABG in 1996. Most recent cardiac cath in 02/2016 showed multiple patent stents to the RCA, severe disease in small distal branches including RV marginal and small PDA, patent LIMA to LAD, subtotal occlusion of SVG to first diagonal with native vessel occluded, sequential SVG to first and second OM with severe stenosis in proximal and distal SVG just prior to anastomosis, occluded SVG to RCA-chronic. Proximal and distal SVG to OM1/2 was stented with DES. There was normal LV function and normal LVEDP. He is to be on DAPT indefinitely.   He was last seen in the office by Dr. Johnsie Cancel on 10/20/16 at which time it was noted that pt had resolution of dizziness off ramipril. BP was stable and pt continued DAPT. Off eliquis which he had taken for bilat PE in 2015.  Today he is here with his wife. He states that he began to have increased ealry fatigue and shortness of breath and just not feeling well with house hold chores like mowing the grass and working on his car for the last few weeks. The other day he had to stop after each lap of mowing the grass which is unusual for him. Yesterday he went to [facility] and was not feeling well, not anything specific. Last night he had trouble laying down and kept having to sit up to feel better. He does not think he was short of breath. This morning he had chest pain without associated shortness of breath that eventually  resolved after SL NTG X 3. He again felt poorly and was unable to finish his breakfast which he usually has a very good appetite.   Significant findings: EKG NSR at 77 bpm, no acute ischemic changes First troponin 0.04 K+ 4.6,   SCr 1.09 Hgb 13.2 CXR: No acute findings  Past Medical History:  Diagnosis Date  . CAD (coronary artery disease)    a. s/p CABG; b. s/p diffuse stenting to RCA; c. 3/4 grafts known to be occluded;  d.  LHC 12/15:  pLAD 100, small CFx diff disz - no amenable to PCI, RCA severe mid and dist ISR, S-OM, S-RCA 100, L-LAD atretic, S-Dx ok (dist seg of Dx 100) >> PCI:  DES to mRCA and POBA/cutting balloon to dist RCA   . Depression   . DM (diabetes mellitus) ([place])    Type 2  . Dyslipidemia   . Hemorrhoids    internal  . History of Doppler ultrasound    Carotid US (2/16):  Normal carotid arteries bilaterally.  Marland Kitchen History of hiatal hernia   . History of kidney stones   . History of pneumonia   . History of seizures    "when I was hospitalized for stroke"  . Hx of echocardiogram    a. Echo 10/15:  mild LVH, EF 55-60%, no RWMA, Ao root 39 mm, mild MR, mild LAE  . Hypertension   .  Leg edema    right  . Myocardial infarction ([place])   . Nephrolithiasis   . Nocturia   . Obesity    morbid  . Osteoarthritis   . Pulmonary embolism ([place])    dx 03/2014 >> On Apixaban  . Stroke ([place]) \   no deficits  . Wears glasses     Past Surgical History:  Procedure Laterality Date  . ANGIOPLASTY / STENTING ILIAC    . BACK SURGERY     x2  . CARDIAC CATHETERIZATION N/A 03/25/2016   Procedure: Left Heart Cath and Cors/Grafts Angiography;  Surgeon: Peter M Martinique, MD;  Location: [place] [facility];  Service: Cardiovascular;  Laterality: N/A;  . CARDIAC CATHETERIZATION N/A 03/25/2016   Procedure: Coronary Stent Intervention;  Surgeon: Peter M Martinique, MD;  Location: [place] [facility];  Service: Cardiovascular;  Laterality: N/A;  . COLONOSCOPY    . Heart Bypass  1996  . LEFT  HEART CATHETERIZATION WITH CORONARY/GRAFT ANGIOGRAM N/A 06/16/2014   Procedure: LEFT HEART CATHETERIZATION WITH Beatrix Fetters;  Surgeon: Burnell Blanks, MD;  Location: [facility] CATH LAB;  Service: Cardiovascular;  Laterality: N/A;  . LUMBAR LAMINECTOMY/DECOMPRESSION MICRODISCECTOMY N/A 02/06/2016   Procedure: Laminectomy and Foraminotomy - Lumbar one-Lumbar two;  Surgeon: Eustace Moore, MD;  Location: [place] [facility]URO ORS;  Service: Neurosurgery;  Laterality: N/A;  . PERCUTANEOUS CORONARY STENT INTERVENTION (PCI-S)  06/16/2014   Procedure: PERCUTANEOUS CORONARY STENT INTERVENTION (PCI-S);  Surgeon: Burnell Blanks, MD;  Location: [facility] CATH LAB;  Service: Cardiovascular;;  MID RCA, Cutting Balloon and Stent Distal RCA, Cutting Balloon   . TOTAL KNEE ARTHROPLASTY     right  . UMBILICAL HERNIA REPAIR       Medications Prior to Admission: Prior to Admission medications   Medication Sig Start Date End Date Taking? Authorizing Provider  amLODipine (NORVASC) 10 MG tablet take 1 tablet by mouth once daily 05/06/16  Yes Josue Hector, MD  aspirin EC 81 MG tablet Take 81 mg by mouth daily.   Yes [provider]  Calcium-Magnesium-Zinc [id number] MG TABS Take 1 tablet by mouth daily.   Yes [provider]  clopidogrel (PLAVIX) 75 MG tablet take 1 tablet by mouth once daily WITH BREAKFAST 10/16/16  Yes Josue Hector, MD  glyBURIDE (DIABETA) 5 MG tablet Take 5 mg by mouth daily. 04/06/17  Yes [provider]  isosorbide mononitrate (IMDUR) 60 MG 24 hr tablet take 1 tablet by mouth at bedtime 04/15/17  Yes Josue Hector, MD  metFORMIN (GLUCOPHAGE) 500 MG tablet Take 2 tablets (1,000 mg total) by mouth 2 (two) times daily with a meal. 03/27/16  Yes Reyne Dumas, MD  metoprolol succinate (TOPROL-XL) 100 MG 24 hr tablet take 1 tablet by mouth every morning and take 1/2 tablet every evening  (TAKE WITH OR IMMEDATELY AFTER MEALS) 03/23/17  Yes Josue Hector, MD  Multiple  Minerals-Vitamins (CAL MAG ZINC +D3 PO) Take 1 tablet by mouth 2 (two) times daily. Per 3 tablets; Calcium 1000 mg, magnesium 400 mg, zinc 25 mg, vitamin D3 600 I.U.   Yes [provider]  oxyCODONE-acetaminophen (PERCOCET) 10-325 MG tablet Take 1 tablet by mouth 2 (two) times daily. 10/16/16  Yes [provider]  nitroGLYCERIN (NITROSTAT) 0.4 MG SL tablet Place 1 tablet (0.4 mg total) under the tongue every 5 (five) minutes as needed for chest pain. 07/06/14   Josue Hector, MD     Allergies:    Allergies  Allergen Reactions  .  Amlodipine Besylate Other (See Comments)    Please use M[employer] brand amlodipine for pt - states he becomes aggressive/agitated and refuses other manufacturers  . Penicillins Swelling and Other (See Comments)    Redness Has patient had a PCN reaction causing immediate rash, facial/tongue/throat swelling, SOB or lightheadedness with hypotension: No Has patient had a PCN reaction causing severe rash involving mucus membranes or skin necrosis: No Has patient had a PCN reaction that required hospitalization No Has patient had a PCN reaction occurring within the last 10 years: Non If all of the above answers are "NO", then may proceed with Cephalosporin use.   . Statins Other (See Comments)      muscle pain    Social History:   Social History   Social History  . Marital status: Legally Separated    Spouse name: N/A  . Number of children: N/A  . Years of education: N/A   Occupational History  . Not on file.   Social History Main Topics  . Smoking status: Never Smoker  . Smokeless tobacco: Never Used  . Alcohol use Yes     Comment: Rarely  . Drug use: No  . Sexual activity: Not on file   Other Topics Concern  . Not on file   Social History Narrative   Enjoys racing and working on cars    Family History:   The patient's family history includes Diabetes in his mother; Stroke in his mother. There is no history of Heart attack.    ROS:    Please see the history of present illness.  All other ROS reviewed and negative.     Physical Exam/Data:   Vitals:   04/20/17 0835 04/20/17 1000 04/20/17 1121  BP: (!) 147/77 125/67   Pulse: 73 70   Resp: 16 14   Temp: 97.8 F (36.6 C)    TempSrc: Oral    SpO2: 97% 95%   Weight:   219 lb (99.3 kg)  Height:   5\' 9"  (1.753 m)   No intake or output data in the 24 hours ending 04/20/17 1257 Filed Weights   04/20/17 1121  Weight: 219 lb (99.3 kg)   Body mass index is 32.34 kg/m.  General:  Well nourished, well developed, in no acute distress HEENT: normal Lymph: no adenopathy Neck: no JVD Endocrine:  No thryomegaly Vascular: No carotid bruits; FA pulses 2+ bilaterally without bruits  Cardiac:  normal S1, S2; irregularly irregular rhythm; no murmur  Lungs:  clear to auscultation bilaterally, no wheezing, rhonchi or rales  Abd: soft, nontender, no hepatomegaly  Ext: no edema Musculoskeletal:  No deformities, BUE and BLE strength normal and equal Skin: warm and dry  Neuro:  CNs 2-12 intact, no focal abnormalities noted Psych:  Normal affect    EKG:  The ECG that was done today was personally reviewed and demonstrates NSR at 77 bpm without acute ischemic changes  Tele: sinus rhythm with frequent PAC's in the 60's  Relevant CV Studies:  Left heart cath 03/25/2016 1. Severe 3 vessel obstructive CAD    - 100% proximal LAD, 90% distal LAD    - 100% proximal LCx.     - Multiple stents in the RCA are patent. There is severe disease in small distal branches including RV marginal and small PDA 2. Patent LIMA to the LAD. There is severe disease in the distal LAD.  3. Subtotal occlusion of the SVG to the first diagonal at the distal anastomosis. The native diagonal is occluded.  4. Patent sequential SVG to the first and second OM vessels. There is a severe stenosis in the proximal SVG and in the distal SVG just prior to the anastomosis. 5. Occluded SVG to the RCA- chronic 6.  Normal LV function, normal LVEDP 7. Successful stenting of the proximal and distal SVG with DES.--> DAPT indefinitely.  Diagnostic Diagram    Post-Intervention Diagram         Laboratory Data:  Chemistry Recent Labs Lab 04/20/17 0951  NA 136  K 4.6  CL 105  CO2 22  GLUCOSE 169*  BUN 14  CREATININE 1.09  CALCIUM 9.0  GFRNONAA >60  GFRAA >60  ANIONGAP 9    No results for input(s): PROT, ALBUMIN, AST, ALT, ALKPHOS, BILITOT in the last 168 hours. Hematology Recent Labs Lab 04/20/17 0951  WBC 8.3  RBC 4.24  HGB 13.2  HCT 40.5  MCV 95.5  MCH 31.1  MCHC 32.6  RDW 12.7  PLT 231   Cardiac EnzymesNo results for input(s): TROPONINI in the last 168 hours.  Recent Labs Lab 04/20/17 1018  TROPIPOC 0.04    BNPNo results for input(s): BNP, PROBNP in the last 168 hours.  DDimer No results for input(s): DDIMER in the last 168 hours.  Radiology/Studies:  Dg Chest 2 View  Result Date: 04/20/2017 CLINICAL DATA:  Chest pain and shortness of breath with activity. EXAM: CHEST  2 VIEW COMPARISON:  03/23/2016. FINDINGS: Trachea is midline. Heart size stable. Lungs are low in volume with mild bibasilar atelectasis. No airspace consolidation or pleural fluid. Degenerative changes in the spine. IMPRESSION: No acute findings. Electronically Signed   By: Lorin Picket M.D.   On: 04/20/2017 09:51    Assessment and Plan:    1. Chest pain: HX CAD s/p CABG 1996. Most recent cath in 02/2016 showed chronic occlusion of RCA graft and patent stents to RCA, severe stenosis of SVG to OM1&2, DES placed. Subtotal occlusion of SVG to first diagonal. Pt on aspirin and plavix, BB, long acting nitrate. Has been having progressively worse early fatigue, shortness of breath and generally not feeling well. Had chest pain this am that eventually resolved with SL NTG X 3. Symptoms concerning for unstable angina. First troponin 0.04. EKG without ischemic changes. CXR normal. Plan to admit and monitor  overnight. Cycle troponins. With his hx of C extensive CAD and progression of symptoms, will likely need cath for definitive cardiac evaluation. Will discuss with Dr. Ellyn Herman.   2. CAD as above: On DAPT for life, BB, Imdur  3. Hypertension: Amlodipine, BB, Imdur (off ramipril due to dizziness in the past). BP currently well controlled.   4. Hx pulmonary emoblus in 2015: now off Eliquis, on asa and plavix for CAD  5. Irregular cardiac rhythm: On tele, there are strips labeled as afib. Looks like sinus rhythm with PAC's. Rate is well controlled in the 60's. Continue to monitor.   Severity of Illness: The appropriate patient status for this patient is OBSERVATION. Observation status is judged to be reasonable and necessary in order to provide the required intensity of service to ensure the patient's safety. The patient's presenting symptoms, physical exam findings, and initial radiographic and laboratory data in the context of their medical condition is felt to place them at decreased risk for further clinical deterioration. Furthermore, it is anticipated that the patient will be medically stable for discharge from the hospital within 2 midnights of admission. The following factors support the patient status of observation.   " The  patient's presenting symptoms include chest pain, early fatigue and DOE with significant cardiac history. " The physical exam findings include irregular heart rhythm. " The initial radiographic and laboratory data are very mildly elevated troponin.     For questions or updates, please contact [place] Please consult [email or web address] for contact info under Cardiology/STEMI.    Signed, Daune Perch, NP  04/20/2017 12:57 PM    I have seen, examined and evaluated the patient this PM along with Janine Hammond,NP.  After reviewing all the available data and chart, we discussed the patients laboratory, study & physical findings as well as symptoms in detail. I  agree with her findings, examination as well as impression recommendations as per our discussion.    Carlos Herman presents today with symptoms very consistent with his prior anginal symptoms. He seems to have had progressively worsening exertional anginal symptoms that now all occurred at rest this morning. Symptoms are concerning for progressive unstable angina. Troponin is mildly elevated, however his story is quite convincing for ACS. He seems to be relatively comfortable now having had complete relief of his symptoms with nitroglycerin. Given his ACS presentation we will start IV heparin and nitroglycerin to keep him pain-free. Recommendation will be to proceed with cardiac catheterization tomorrow. He is posted and consented for cath.  His exam is relatively benign and he is chest pain-free at this point.  His home medicines are actually quite appropriate with Toprol, amlodipine, aspirin plus Plavix and Imdur. We will replace Imdur with IV nitroglycerin. He is on oral medications for diabetes. We will hold metformin and cover with sliding scale.    Carlos Herman, M.D., M.S. Interventional Cardiologist   Pager # [phone] Phone # [phone] [address] [place], [place] [address]

## 2017-04-20 NOTE — ED Provider Notes (Signed)
[place] EMERGENCY DEPARTMENT Provider Note   CSN: [id number] Arrival date & time: 04/20/17  0160     History   Chief Complaint Chief Complaint  Patient presents with  . Chest Pain    HPI LEVAUGHN PUCCINELLI is a 76 y.o. male.  The history is provided by the patient and medical records. No language interpreter was used.  Chest Pain   Associated symptoms include shortness of breath. Pertinent negatives include no cough and no palpitations.   EWARD RUTIGLIANO is a 76 y.o. male  with a PMH of CAD s/p CABG, HTN, HLD, DM who presents to the Emergency Department complaining of chest pain which began late last night and persisted this morning. Pain is described as central and non-radiating, worse with exertion. Associated with shortness of breath. He states this feels similar to discomfort he experienced when he had stents placed in the past. He states that he has not felt well for the last 2-3 days. He notes that he was not able to mow the lawn because he became very fatigued and short of breath which is unusual for him. He also notes feeling tightness in his chest when he attempted to mow the lawn or with exertion that typically would not cause him discomfort like walking around the house. He took 3 nitro SL today and notes pain is nearly resolved. No longer feels short of breath. He took his usual home meds this morning, consisting of 81 mg ASA. No further aspirin taken. No abdominal pain, nausea, vomiting, diaphoresis, cough, congestion, fever, chills.  Past Medical History:  Diagnosis Date  . CAD (coronary artery disease)    a. s/p CABG; b. s/p diffuse stenting to RCA; c. 3/4 grafts known to be occluded;  d.  LHC 12/15:  pLAD 100, small CFx diff disz - no amenable to PCI, RCA severe mid and dist ISR, S-OM, S-RCA 100, L-LAD atretic, S-Dx ok (dist seg of Dx 100) >> PCI:  DES to mRCA and POBA/cutting balloon to dist RCA   . Depression   . DM (diabetes mellitus) ([place])    Type 2  . Dyslipidemia   . Hemorrhoids    internal  . History of Doppler ultrasound    Carotid US (2/16):  Normal carotid arteries bilaterally.  Marland Kitchen History of hiatal hernia   . History of kidney stones   . History of pneumonia   . History of seizures    "when I was hospitalized for stroke"  . Hx of echocardiogram    a. Echo 10/15:  mild LVH, EF 55-60%, no RWMA, Ao root 39 mm, mild MR, mild LAE  . Hypertension   . Leg edema    right  . Myocardial infarction ([place])   . Nephrolithiasis   . Nocturia   . Obesity    morbid  . Osteoarthritis   . Pulmonary embolism ([place])    dx 03/2014 >> On Apixaban  . Stroke ([place]) \   no deficits  . Wears glasses     Patient Active Problem List   Diagnosis Date Noted  . Elevated troponin - consider NSTEMI 03/25/2016  . NSTEMI (non-ST elevated myocardial infarction) ([place])   . Pancreatic lesion 03/24/2016  . SBO (small bowel obstruction) ([place]) 03/24/2016  . Small bowel obstruction ([place]) 03/24/2016  . Pain in the chest   . Acute coronary syndrome ([place])   . S/P lumbar laminectomy 02/06/2016  . Chronic anticoagulation 06/18/2014  . Back pain 04/26/2014  . Pulmonary embolus-  Oct 2015 04/10/2014  . Pain in joint, lower leg 02/07/2014  . HIP REPLACEMENT, RIGHT, HX OF 09/07/2009  . FRACTURE, ANKLE, RIGHT 08/17/2008  . S/P CABG 1996 08/16/2008  . Type 2 diabetes mellitus ([place]) 09/28/2007  . HLD (hyperlipidemia) 09/28/2007  . MORBID OBESITY 09/28/2007  . DEPRESSION 09/28/2007  . Essential hypertension 09/28/2007  . Coronary artery disease involving native coronary artery 09/28/2007  . STROKE 09/28/2007  . NEPHROLITHIASIS 09/28/2007  . OSTEOARTHRITIS, KNEE 09/28/2007  . LEG EDEMA, RIGHT 09/28/2007  . HEMORRHOIDS, INTERNAL 03/11/2007    Past Surgical History:  Procedure Laterality Date  . ANGIOPLASTY / STENTING ILIAC    . BACK SURGERY     x2  . CARDIAC CATHETERIZATION N/A 03/25/2016   Procedure: Left Heart Cath and Cors/Grafts Angiography;   Surgeon: Peter M Martinique, MD;  Location: [facility] CV LAB;  Service: Cardiovascular;  Laterality: N/A;  . CARDIAC CATHETERIZATION N/A 03/25/2016   Procedure: Coronary Stent Intervention;  Surgeon: Peter M Martinique, MD;  Location: [facility] CV LAB;  Service: Cardiovascular;  Laterality: N/A;  . COLONOSCOPY    . Heart Bypass  1996  . LEFT HEART CATHETERIZATION WITH CORONARY/GRAFT ANGIOGRAM N/A 06/16/2014   Procedure: LEFT HEART CATHETERIZATION WITH Beatrix Fetters;  Surgeon: Burnell Blanks, MD;  Location: [facility] CATH LAB;  Service: Cardiovascular;  Laterality: N/A;  . LUMBAR LAMINECTOMY/DECOMPRESSION MICRODISCECTOMY N/A 02/06/2016   Procedure: Laminectomy and Foraminotomy - Lumbar one-Lumbar two;  Surgeon: Eustace Moore, MD;  Location: [facility] NEURO ORS;  Service: Neurosurgery;  Laterality: N/A;  . PERCUTANEOUS CORONARY STENT INTERVENTION (PCI-S)  06/16/2014   Procedure: PERCUTANEOUS CORONARY STENT INTERVENTION (PCI-S);  Surgeon: Burnell Blanks, MD;  Location: [facility] CATH LAB;  Service: Cardiovascular;;  MID RCA, Cutting Balloon and Stent Distal RCA, Cutting Balloon   . TOTAL KNEE ARTHROPLASTY     right  . UMBILICAL HERNIA REPAIR         Home Medications    Prior to Admission medications   Medication Sig Start Date End Date Taking? Authorizing Provider  amLODipine (NORVASC) 10 MG tablet take 1 tablet by mouth once daily 05/06/16  Yes Josue Hector, MD  aspirin EC 81 MG tablet Take 81 mg by mouth daily.   Yes [provider]  Calcium-Magnesium-Zinc [phone] MG TABS Take 1 tablet by mouth daily.   Yes [provider]  clopidogrel (PLAVIX) 75 MG tablet take 1 tablet by mouth once daily WITH BREAKFAST 10/16/16  Yes Josue Hector, MD  glyBURIDE (DIABETA) 5 MG tablet Take 5 mg by mouth daily. 04/06/17  Yes [provider]  isosorbide mononitrate (IMDUR) 60 MG 24 hr tablet take 1 tablet by mouth at bedtime 04/15/17  Yes Josue Hector, MD  metFORMIN  (GLUCOPHAGE) 500 MG tablet Take 2 tablets (1,000 mg total) by mouth 2 (two) times daily with a meal. 03/27/16  Yes Reyne Dumas, MD  metoprolol succinate (TOPROL-XL) 100 MG 24 hr tablet take 1 tablet by mouth every morning and take 1/2 tablet every evening  (TAKE WITH OR IMMEDATELY AFTER MEALS) 03/23/17  Yes Josue Hector, MD  Multiple Minerals-Vitamins (CAL MAG ZINC +D3 PO) Take 1 tablet by mouth 2 (two) times daily. Per 3 tablets; Calcium 1000 mg, magnesium 400 mg, zinc 25 mg, vitamin D3 600 I.U.   Yes [provider]  oxyCODONE-acetaminophen (PERCOCET) 10-325 MG tablet Take 1 tablet by mouth 2 (two) times daily. 10/16/16  Yes [provider]  nitroGLYCERIN (NITROSTAT) 0.4 MG SL tablet Place  1 tablet (0.4 mg total) under the tongue every 5 (five) minutes as needed for chest pain. 07/06/14   Josue Hector, MD    Family History Family History  Problem Relation Age of Onset  . Stroke Mother   . Diabetes Mother   . Heart attack Neg Hx     Social History Social History  Substance Use Topics  . Smoking status: Never Smoker  . Smokeless tobacco: Never Used  . Alcohol use Yes     Comment: Rarely     Allergies   Amlodipine besylate; Penicillins; and Statins   Review of Systems Review of Systems  Respiratory: Positive for shortness of breath. Negative for cough and wheezing.   Cardiovascular: Positive for chest pain. Negative for palpitations and leg swelling.  All other systems reviewed and are negative.    Physical Exam Updated Vital Signs BP 125/67   Pulse 70   Temp 97.8 F (36.6 C) (Oral)   Resp 14   Ht 5\' 9"  (1.753 m)   Wt 99.3 kg (219 lb)   SpO2 95%   BMI 32.34 kg/m   Physical Exam  Constitutional: He is oriented to person, place, and time. He appears well-developed and well-nourished. No distress.  HENT:  Head: Normocephalic and atraumatic.  Neck: No JVD present.  Cardiovascular: Normal rate, regular rhythm and normal heart sounds.   No murmur  heard. Pulmonary/Chest: Effort normal and breath sounds normal. No respiratory distress.  Abdominal: Soft. He exhibits no distension. There is no tenderness.  Musculoskeletal: He exhibits no edema.  Neurological: He is alert and oriented to person, place, and time.  Skin: Skin is warm and dry.  Nursing note and vitals reviewed.    ED Treatments / Results  Labs (all labs ordered are listed, but only abnormal results are displayed) Labs Reviewed  BASIC METABOLIC PANEL - Abnormal; Notable for the following:       Result Value   Glucose, Bld 169 (*)    All other components within normal limits  CBC WITH DIFFERENTIAL/PLATELET  I-STAT TROPONIN, ED    EKG  EKG Interpretation  Date/Time:  Monday April 20 2017 08:26:38 EDT Ventricular Rate:  77 PR Interval:  176 QRS Duration: 84 QT Interval:  406 QTC Calculation: 459 R Axis:   45 Text Interpretation:  Normal sinus rhythm T wave changes less prominent when compared to Sept 2017 Confirmed by Sherwood Gambler [phone]) on 04/20/2017 8:40:08 AM Also confirmed by Sherwood Gambler [phone])  on 04/20/2017 1:19:08 PM       Radiology Dg Chest 2 View  Result Date: 04/20/2017 CLINICAL DATA:  Chest pain and shortness of breath with activity. EXAM: CHEST  2 VIEW COMPARISON:  03/23/2016. FINDINGS: Trachea is midline. Heart size stable. Lungs are low in volume with mild bibasilar atelectasis. No airspace consolidation or pleural fluid. Degenerative changes in the spine. IMPRESSION: No acute findings. Electronically Signed   By: Lorin Picket M.D.   On: 04/20/2017 09:51    Procedures Procedures (including critical care time)  Medications Ordered in ED Medications  nitroGLYCERIN (NITROSTAT) SL tablet 0.4 mg (not administered)  aspirin chewable tablet 243 mg (243 mg Oral Given 04/20/17 1027)     Initial Impression / Assessment and Plan / ED Course  I have reviewed the triage vital signs and the nursing notes.  Pertinent labs & imaging  results that were available during my care of the patient were reviewed by me and considered in my medical decision making (see chart for details).  DARELLE KINGS is a 76 y.o. male who presents to ED for chest pain concerning for cardiac etiology. Troponin 0.04. EKG reassuring. CXR negative. Cardiology consulted who will admit.   Patient seen by and discussed with Dr. Regenia Skeeter who agrees with treatment plan.   Final Clinical Impressions(s) / ED Diagnoses   Final diagnoses:  Chest pain    New Prescriptions New Prescriptions   No medications on file     Amarian Botero, Ozella Almond, PA-C 04/20/17 Duck, [place], MD 04/21/17 [phone]

## 2017-04-21 ENCOUNTER — Inpatient Hospital Stay (HOSPITAL_COMMUNITY): Payer: Medicare Other

## 2017-04-21 ENCOUNTER — Encounter (HOSPITAL_COMMUNITY): Payer: Self-pay | Admitting: Cardiology

## 2017-04-21 ENCOUNTER — Encounter (HOSPITAL_COMMUNITY)
Admission: EM | Disposition: A | Discharge status: Home / Self Care | Payer: Self-pay | Source: Home / Self Care | Attending: Cardiology

## 2017-04-21 DIAGNOSIS — Z888 Allergy status to other drugs, medicaments and biological substances status: Secondary | ICD-10-CM | POA: Diagnosis not present

## 2017-04-21 DIAGNOSIS — I214 Non-ST elevation (NSTEMI) myocardial infarction: Secondary | ICD-10-CM | POA: Diagnosis not present

## 2017-04-21 DIAGNOSIS — I491 Atrial premature depolarization: Secondary | ICD-10-CM | POA: Diagnosis not present

## 2017-04-21 DIAGNOSIS — E785 Hyperlipidemia, unspecified: Secondary | ICD-10-CM | POA: Diagnosis present

## 2017-04-21 DIAGNOSIS — Z7902 Long term (current) use of antithrombotics/antiplatelets: Secondary | ICD-10-CM | POA: Diagnosis not present

## 2017-04-21 DIAGNOSIS — E119 Type 2 diabetes mellitus without complications: Secondary | ICD-10-CM | POA: Diagnosis present

## 2017-04-21 DIAGNOSIS — Z955 Presence of coronary angioplasty implant and graft: Secondary | ICD-10-CM | POA: Diagnosis not present

## 2017-04-21 DIAGNOSIS — Z6832 Body mass index (BMI) 32.0-32.9, adult: Secondary | ICD-10-CM | POA: Diagnosis not present

## 2017-04-21 DIAGNOSIS — I25810 Atherosclerosis of coronary artery bypass graft(s) without angina pectoris: Secondary | ICD-10-CM | POA: Diagnosis not present

## 2017-04-21 DIAGNOSIS — Z7984 Long term (current) use of oral hypoglycemic drugs: Secondary | ICD-10-CM | POA: Diagnosis not present

## 2017-04-21 DIAGNOSIS — Z880 Allergy status to penicillin: Secondary | ICD-10-CM | POA: Diagnosis not present

## 2017-04-21 DIAGNOSIS — Z7982 Long term (current) use of aspirin: Secondary | ICD-10-CM | POA: Diagnosis not present

## 2017-04-21 DIAGNOSIS — I200 Unstable angina: Secondary | ICD-10-CM

## 2017-04-21 DIAGNOSIS — I252 Old myocardial infarction: Secondary | ICD-10-CM | POA: Diagnosis not present

## 2017-04-21 DIAGNOSIS — Z8673 Personal history of transient ischemic attack (TIA), and cerebral infarction without residual deficits: Secondary | ICD-10-CM | POA: Diagnosis not present

## 2017-04-21 DIAGNOSIS — Z79899 Other long term (current) drug therapy: Secondary | ICD-10-CM | POA: Diagnosis not present

## 2017-04-21 DIAGNOSIS — Z86711 Personal history of pulmonary embolism: Secondary | ICD-10-CM | POA: Diagnosis not present

## 2017-04-21 DIAGNOSIS — E669 Obesity, unspecified: Secondary | ICD-10-CM | POA: Diagnosis present

## 2017-04-21 DIAGNOSIS — R079 Chest pain, unspecified: Secondary | ICD-10-CM | POA: Diagnosis not present

## 2017-04-21 DIAGNOSIS — I25110 Atherosclerotic heart disease of native coronary artery with unstable angina pectoris: Secondary | ICD-10-CM

## 2017-04-21 DIAGNOSIS — F329 Major depressive disorder, single episode, unspecified: Secondary | ICD-10-CM | POA: Diagnosis present

## 2017-04-21 DIAGNOSIS — I10 Essential (primary) hypertension: Secondary | ICD-10-CM | POA: Diagnosis not present

## 2017-04-21 DIAGNOSIS — Z96651 Presence of right artificial knee joint: Secondary | ICD-10-CM | POA: Diagnosis present

## 2017-04-21 DIAGNOSIS — I2510 Atherosclerotic heart disease of native coronary artery without angina pectoris: Secondary | ICD-10-CM | POA: Diagnosis present

## 2017-04-21 HISTORY — PX: CORONARY ANGIOPLASTY WITH STENT PLACEMENT: SHX49

## 2017-04-21 HISTORY — PX: LEFT HEART CATH AND CORS/GRAFTS ANGIOGRAPHY: CATH118250

## 2017-04-21 HISTORY — PX: CORONARY STENT INTERVENTION: CATH118234

## 2017-04-21 LAB — CBC
HCT: 42.3 % (ref 39.0–52.0)
HEMATOCRIT: 40.1 % (ref 39.0–52.0)
HEMOGLOBIN: 13.3 g/dL (ref 13.0–17.0)
Hemoglobin: 14.4 g/dL (ref 13.0–17.0)
MCH: 31.3 pg (ref 26.0–34.0)
MCH: 32 pg (ref 26.0–34.0)
MCHC: 33.2 g/dL (ref 30.0–36.0)
MCHC: 34 g/dL (ref 30.0–36.0)
MCV: 94.4 fL (ref 78.0–100.0)
MCV: 94 fL (ref 78.0–100.0)
PLATELETS: 238 10*3/uL (ref 150–400)
Platelets: 242 10*3/uL (ref 150–400)
RBC: 4.25 MIL/uL (ref 4.22–5.81)
RBC: 4.5 MIL/uL (ref 4.22–5.81)
RDW: 12.5 % (ref 11.5–15.5)
RDW: 12.4 % (ref 11.5–15.5)
WBC: 9.3 10*3/uL (ref 4.0–10.5)
WBC: 9.2 10*3/uL (ref 4.0–10.5)

## 2017-04-21 LAB — POCT ACTIVATED CLOTTING TIME
ACTIVATED CLOTTING TIME: 252 s
ACTIVATED CLOTTING TIME: 285 s
Activated Clotting Time: 252 seconds

## 2017-04-21 LAB — GLUCOSE, CAPILLARY
GLUCOSE-CAPILLARY: 109 mg/dL — AB (ref 65–99)
Glucose-Capillary: 143 mg/dL — ABNORMAL HIGH (ref 65–99)
Glucose-Capillary: 140 mg/dL — ABNORMAL HIGH (ref 65–99)

## 2017-04-21 LAB — BASIC METABOLIC PANEL
ANION GAP: 11 (ref 5–15)
BUN: 11 mg/dL (ref 6–20)
CHLORIDE: 103 mmol/L (ref 101–111)
CO2: 22 mmol/L (ref 22–32)
Calcium: 8.7 mg/dL — ABNORMAL LOW (ref 8.9–10.3)
Creatinine, Ser: 0.81 mg/dL (ref 0.61–1.24)
GFR calc non Af Amer: >60 mL/min (ref >60)
Glucose, Bld: 133 mg/dL — ABNORMAL HIGH (ref 65–99)
Potassium: 4 mmol/L (ref 3.5–5.1)
Sodium: 136 mmol/L (ref 135–145)

## 2017-04-21 LAB — I-STAT TROPONIN, ED
TROPONIN I, POC: 0.18 ng/mL — AB (ref 0.00–0.08)
Troponin i, poc: 0.2 ng/mL (ref 0.00–0.08)

## 2017-04-21 LAB — ECHOCARDIOGRAM COMPLETE
Height: 69 in
WEIGHTICAEL: 3504 [oz_av]

## 2017-04-21 LAB — CREATININE, SERUM
CREATININE: 1.01 mg/dL (ref 0.61–1.24)
GFR calc Af Amer: >60 mL/min (ref >60)
GFR calc non Af Amer: >60 mL/min (ref >60)

## 2017-04-21 LAB — PROTIME-INR
INR: 0.97
Prothrombin Time: 12.8 seconds (ref 11.4–15.2)

## 2017-04-21 LAB — HEPARIN LEVEL (UNFRACTIONATED)

## 2017-04-21 SURGERY — LEFT HEART CATH AND CORS/GRAFTS ANGIOGRAPHY
Anesthesia: LOCAL

## 2017-04-21 MED ORDER — ANGIOPLASTY BOOK
Freq: Once | Status: AC
  Administered 2017-04-21: 22:00:00
  Filled 2017-04-21: qty 1

## 2017-04-21 MED ORDER — SODIUM CHLORIDE 0.9% FLUSH
3.0000 mL | Freq: Two times a day (BID) | INTRAVENOUS | Status: DC
  Administered 2017-04-21: 22:00:00 3 mL via INTRAVENOUS

## 2017-04-21 MED ORDER — VERAPAMIL HCL 2.5 MG/ML IV SOLN
INTRAVENOUS | Status: AC
  Filled 2017-04-21: qty 2

## 2017-04-21 MED ORDER — INSULIN ASPART 100 UNIT/ML ~~LOC~~ SOLN
SUBCUTANEOUS | Status: AC
  Filled 2017-04-21: qty 1

## 2017-04-21 MED ORDER — ASPIRIN 81 MG PO CHEW
81.0000 mg | CHEWABLE_TABLET | ORAL | Status: DC
Start: 2017-04-22 — End: 2017-04-21

## 2017-04-21 MED ORDER — CLOPIDOGREL BISULFATE 75 MG PO TABS
75.0000 mg | ORAL_TABLET | Freq: Every day | ORAL | Status: DC
  Administered 2017-04-22: 09:00:00 75 mg via ORAL
  Filled 2017-04-21: qty 1

## 2017-04-21 MED ORDER — NITROGLYCERIN 1 MG/10 ML FOR IR/CATH LAB
INTRA_ARTERIAL | Status: AC
Start: 2017-04-21 — End: ?
  Filled 2017-04-21: qty 10

## 2017-04-21 MED ORDER — HEPARIN SODIUM (PORCINE) 1000 UNIT/ML IJ SOLN
INTRAMUSCULAR | Status: AC
  Filled 2017-04-21: qty 1

## 2017-04-21 MED ORDER — ENOXAPARIN SODIUM 40 MG/0.4ML ~~LOC~~ SOLN
40.0000 mg | SUBCUTANEOUS | Status: DC
  Administered 2017-04-22: 09:00:00 40 mg via SUBCUTANEOUS
  Filled 2017-04-21: qty 0.4

## 2017-04-21 MED ORDER — OXYCODONE-ACETAMINOPHEN 5-325 MG PO TABS
1.0000 | ORAL_TABLET | Freq: Three times a day (TID) | ORAL | Status: DC | PRN
  Administered 2017-04-21: 1 via ORAL
  Filled 2017-04-21: qty 1

## 2017-04-21 MED ORDER — SODIUM CHLORIDE 0.9 % WEIGHT BASED INFUSION
3.0000 mL/kg/h | INTRAVENOUS | Status: DC
  Administered 2017-04-21: 3 mL/kg/h via INTRAVENOUS

## 2017-04-21 MED ORDER — MIDAZOLAM HCL 2 MG/2ML IJ SOLN
INTRAMUSCULAR | Status: AC
  Filled 2017-04-21: qty 2

## 2017-04-21 MED ORDER — IOPAMIDOL (ISOVUE-370) INJECTION 76%
INTRAVENOUS | Status: AC
  Filled 2017-04-21: qty 125

## 2017-04-21 MED ORDER — ASPIRIN 81 MG PO CHEW
81.0000 mg | CHEWABLE_TABLET | ORAL | Status: AC
  Administered 2017-04-21: 81 mg via ORAL
  Filled 2017-04-21: qty 1

## 2017-04-21 MED ORDER — OXYCODONE HCL 5 MG PO TABS
5.0000 mg | ORAL_TABLET | Freq: Two times a day (BID) | ORAL | Status: DC
  Administered 2017-04-21 – 2017-04-22 (×2): 5 mg via ORAL
  Filled 2017-04-21 (×2): qty 1

## 2017-04-21 MED ORDER — IOPAMIDOL (ISOVUE-370) INJECTION 76%
INTRAVENOUS | Status: DC | PRN
  Administered 2017-04-21: 150 mL via INTRA_ARTERIAL

## 2017-04-21 MED ORDER — HEPARIN SODIUM (PORCINE) 5000 UNIT/ML IJ SOLN: 5000.0000 [IU] | Freq: Three times a day (TID) | INTRAMUSCULAR | Status: DC

## 2017-04-21 MED ORDER — SODIUM CHLORIDE 0.9 % IV SOLN: 250.0000 mL | INTRAVENOUS | Status: DC | PRN

## 2017-04-21 MED ORDER — SODIUM CHLORIDE 0.9% FLUSH: 3.0000 mL | INTRAVENOUS | Status: DC | PRN

## 2017-04-21 MED ORDER — HYDRALAZINE HCL 20 MG/ML IJ SOLN: 5.0000 mg | INTRAMUSCULAR | Status: AC | PRN

## 2017-04-21 MED ORDER — HEART ATTACK BOUNCING BOOK
Freq: Once | Status: AC
  Administered 2017-04-21: 22:00:00
  Filled 2017-04-21: qty 1

## 2017-04-21 MED ORDER — HEPARIN BOLUS VIA INFUSION
3000.0000 [IU] | Freq: Once | INTRAVENOUS | Status: AC
  Administered 2017-04-21: 3000 [IU] via INTRAVENOUS
  Filled 2017-04-21: qty 3000

## 2017-04-21 MED ORDER — HEPARIN (PORCINE) IN NACL 2-0.9 UNIT/ML-% IJ SOLN
INTRAMUSCULAR | Status: AC | PRN
  Administered 2017-04-21: 1000 mL via INTRA_ARTERIAL
  Administered 2017-04-21: 500 mL via INTRA_ARTERIAL

## 2017-04-21 MED ORDER — HEPARIN SODIUM (PORCINE) 1000 UNIT/ML IJ SOLN
INTRAMUSCULAR | Status: DC | PRN
  Administered 2017-04-21: 3000 [IU] via INTRAVENOUS
  Administered 2017-04-21: 5000 [IU] via INTRAVENOUS
  Administered 2017-04-21: 3000 [IU] via INTRAVENOUS
  Administered 2017-04-21: 5000 [IU] via INTRAVENOUS

## 2017-04-21 MED ORDER — NITROGLYCERIN 1 MG/10 ML FOR IR/CATH LAB
INTRA_ARTERIAL | Status: DC | PRN
  Administered 2017-04-21: 200 ug via INTRACORONARY

## 2017-04-21 MED ORDER — ASPIRIN 81 MG PO CHEW
81.0000 mg | CHEWABLE_TABLET | Freq: Every day | ORAL | Status: DC
  Administered 2017-04-22: 81 mg via ORAL
  Filled 2017-04-21: qty 1

## 2017-04-21 MED ORDER — LIDOCAINE HCL 2 % IJ SOLN
INTRAMUSCULAR | Status: AC
  Filled 2017-04-21: qty 10

## 2017-04-21 MED ORDER — FENTANYL CITRATE (PF) 100 MCG/2ML IJ SOLN
INTRAMUSCULAR | Status: AC
  Filled 2017-04-21: qty 2

## 2017-04-21 MED ORDER — SODIUM CHLORIDE 0.9 % WEIGHT BASED INFUSION: 1.0000 mL/kg/h | INTRAVENOUS | Status: DC

## 2017-04-21 MED ORDER — LABETALOL HCL 5 MG/ML IV SOLN: 10.0000 mg | INTRAVENOUS | Status: AC | PRN

## 2017-04-21 MED ORDER — MIDAZOLAM HCL 2 MG/2ML IJ SOLN
INTRAMUSCULAR | Status: DC | PRN
  Administered 2017-04-21: 1 mg via INTRAVENOUS

## 2017-04-21 MED ORDER — SODIUM CHLORIDE 0.9 % WEIGHT BASED INFUSION: 1.0000 mL/kg/h | INTRAVENOUS | Status: AC

## 2017-04-21 MED ORDER — HEPARIN (PORCINE) IN NACL 2-0.9 UNIT/ML-% IJ SOLN
INTRAMUSCULAR | Status: AC
  Filled 2017-04-21: qty 1000

## 2017-04-21 MED ORDER — VERAPAMIL HCL 2.5 MG/ML IV SOLN
INTRAVENOUS | Status: DC | PRN
  Administered 2017-04-21: 09:00:00 via INTRA_ARTERIAL

## 2017-04-21 MED ORDER — FENTANYL CITRATE (PF) 100 MCG/2ML IJ SOLN
INTRAMUSCULAR | Status: DC | PRN
  Administered 2017-04-21: 25 ug via INTRAVENOUS

## 2017-04-21 MED ORDER — CLOPIDOGREL BISULFATE 300 MG PO TABS
ORAL_TABLET | ORAL | Status: DC | PRN
  Administered 2017-04-21: 75 mg via ORAL

## 2017-04-21 MED ORDER — LIDOCAINE HCL (PF) 1 % IJ SOLN
INTRAMUSCULAR | Status: DC | PRN
  Administered 2017-04-21: 2 mL via INTRADERMAL

## 2017-04-21 MED ORDER — CLOPIDOGREL BISULFATE 75 MG PO TABS
ORAL_TABLET | ORAL | Status: AC
  Filled 2017-04-21: qty 1

## 2017-04-21 SURGICAL SUPPLY — 26 items
BALLN SAPPHIRE 2.5X15 (BALLOONS) ×2
BALLN ~~LOC~~ EUPHORA RX 4.5X12 (BALLOONS) ×2
BALLOON SAPPHIRE 2.5X15 (BALLOONS) IMPLANT
BALLOON ~~LOC~~ EUPHORA RX 4.5X12 (BALLOONS) IMPLANT
CATH INFINITI 5 FR 3DRC (CATHETERS) ×1 IMPLANT
CATH INFINITI 5 FR AR2 MOD (CATHETERS) ×1 IMPLANT
CATH INFINITI 5 FR IM (CATHETERS) ×1 IMPLANT
CATH INFINITI 5FR AL1 (CATHETERS) ×1 IMPLANT
CATH INFINITI 5FR MULTPACK ANG (CATHETERS) ×1 IMPLANT
CATH LAUNCHER 5F RADR (CATHETERS) IMPLANT
CATH LAUNCHER 6FR AL.75 (CATHETERS) ×1 IMPLANT
CATH VISTA GUIDE 6FR AL1 (CATHETERS) ×1 IMPLANT
CATHETER LAUNCHER 5F RADR (CATHETERS) ×2
DEVICE RAD COMP TR BAND LRG (VASCULAR PRODUCTS) ×1 IMPLANT
DEVICE SPIDERFX EMB PROT 4MM (WIRE) ×1 IMPLANT
GLIDESHEATH SLEND SS 6F .021 (SHEATH) ×1 IMPLANT
GUIDEWIRE INQWIRE 1.5J.035X260 (WIRE) IMPLANT
INQWIRE 1.5J .035X260CM (WIRE) ×2
KIT ENCORE 26 ADVANTAGE (KITS) ×1 IMPLANT
KIT HEART LEFT (KITS) ×2 IMPLANT
PACK CARDIAC CATHETERIZATION (CUSTOM PROCEDURE TRAY) ×2 IMPLANT
STENT SIERRA 4.00 X 33 MM (Permanent Stent) ×1 IMPLANT
TRANSDUCER W/STOPCOCK (MISCELLANEOUS) ×2 IMPLANT
TUBING CIL FLEX 10 FLL-RA (TUBING) ×2 IMPLANT
WIRE ASAHI PROWATER 180CM (WIRE) ×1 IMPLANT
WIRE HI TORQ VERSACORE-J 145CM (WIRE) ×1 IMPLANT

## 2017-04-21 NOTE — Interval H&P Note (Signed)
History and Physical Interval Note:  04/21/2017 8:41 AM  Carlos Herman  has presented today for surgery, with the diagnosis of cp  The various methods of treatment have been discussed with the patient and family. After consideration of risks, benefits and other options for treatment, the patient has consented to  Procedure(s): LEFT HEART CATH AND CORS/GRAFTS ANGIOGRAPHY (N/A) as a surgical intervention .  The patient's history has been reviewed, patient examined, no change in status, stable for surgery.  I have reviewed the patient's chart and labs.  Questions were answered to the patient's satisfaction.   Cath Lab Visit (complete for each Cath Lab visit)  Clinical Evaluation Leading to the Procedure:   ACS: Yes.    Non-ACS:    Anginal Classification: CCS IV  Anti-ischemic medical therapy: Maximal Therapy (2 or more classes of medications)  Non-Invasive Test Results: No non-invasive testing performed  Prior CABG: Previous CABG        [name] [facility] 04/21/2017 8:41 AM

## 2017-04-21 NOTE — Progress Notes (Signed)
TR BAND REMOVAL  LOCATION:    left radial  DEFLATED PER PROTOCOL:    Yes.    TIME BAND OFF / DRESSING APPLIED:    1530   SITE UPON ARRIVAL:    Level 0  SITE AFTER BAND REMOVAL:    Level 0  CIRCULATION SENSATION AND MOVEMENT:    Within Normal Limits   Yes.    COMMENTS:   Tolerated procedure well

## 2017-04-21 NOTE — Progress Notes (Signed)
  Echocardiogram 2D Echocardiogram has been performed.  Carlos Herman 04/21/2017, 3:21 PM

## 2017-04-21 NOTE — ED Notes (Signed)
Pt belongings given to significant other [employer], including medications, clothing, and wallet (which has already been taken home).

## 2017-04-21 NOTE — Progress Notes (Signed)
ANTICOAGULATION CONSULT NOTE - Follow Up Consult  Pharmacy Consult for heparin Indication: chest pain/ACS  Labs:  Recent Labs  04/20/17 0951 04/20/17 2156 04/21/17 0104  HGB 13.2 14.0  --   HCT 40.5 41.6  --   PLT 231 225  --   HEPARINUNFRC  --   --  <0.10*  CREATININE 1.09 0.91  --     Assessment: 75yo male undetectable on heparin with initial dosing for CP.  Goal of Therapy:  Heparin level 0.3-0.7 units/ml   Plan:  Will rebolus with heparin 3000 units and increase gtt by 4 units/kg/hr to 1500 units/hr and check level in 6hr.  Wynona Neat, PharmD, BCPS  04/21/2017,2:37 AM

## 2017-04-22 DIAGNOSIS — Z955 Presence of coronary angioplasty implant and graft: Secondary | ICD-10-CM

## 2017-04-22 DIAGNOSIS — I214 Non-ST elevation (NSTEMI) myocardial infarction: Principal | ICD-10-CM

## 2017-04-22 LAB — BASIC METABOLIC PANEL
ANION GAP: 11 (ref 5–15)
BUN: 9 mg/dL (ref 6–20)
CALCIUM: 8.7 mg/dL — AB (ref 8.9–10.3)
CO2: 23 mmol/L (ref 22–32)
Chloride: 104 mmol/L (ref 101–111)
Creatinine, Ser: 0.88 mg/dL (ref 0.61–1.24)
GFR calc Af Amer: >60 mL/min (ref >60)
GLUCOSE: 114 mg/dL — AB (ref 65–99)
Potassium: 3.9 mmol/L (ref 3.5–5.1)
Sodium: 138 mmol/L (ref 135–145)

## 2017-04-22 LAB — CBC
HCT: 39.2 % (ref 39.0–52.0)
Hemoglobin: 12.9 g/dL — ABNORMAL LOW (ref 13.0–17.0)
MCH: 30.9 pg (ref 26.0–34.0)
MCHC: 32.9 g/dL (ref 30.0–36.0)
MCV: 93.8 fL (ref 78.0–100.0)
PLATELETS: 245 10*3/uL (ref 150–400)
RBC: 4.18 MIL/uL — ABNORMAL LOW (ref 4.22–5.81)
RDW: 12.7 % (ref 11.5–15.5)
WBC: 11 10*3/uL — AB (ref 4.0–10.5)

## 2017-04-22 LAB — GLUCOSE, CAPILLARY: Glucose-Capillary: 133 mg/dL — ABNORMAL HIGH (ref 65–99)

## 2017-04-22 MED FILL — Clopidogrel Bisulfate Tab 75 MG (Base Equiv): ORAL | Qty: 1 | Status: AC

## 2017-04-22 MED FILL — Lidocaine HCl Local Inj 2%: INTRAMUSCULAR | Qty: 10 | Status: AC

## 2017-04-22 NOTE — Care Management Note (Signed)
Case Management Note  Patient Details  Name: Carlos Herman MRN: [id number] Date of Birth: 25-Mar-1941  Subjective/Objective:  From home with wife, pta indep, s/p coronary stent, will be on plavix. For dc today, no needs.                  Action/Plan: DC home no needs.  Expected Discharge Date:                  Expected Discharge Plan:  Home/Self Care  In-House Referral:     Discharge planning Services  CM Consult  Post Acute Care Choice:    Choice offered to:     DME Arranged:    DME Agency:     HH Arranged:    [place] Agency:     Status of Service:  Completed, signed off  If discussed at [employer] of Stay Meetings, dates discussed:    Additional Comments:  Zenon Mayo, RN 04/22/2017, 9:31 AM

## 2017-04-22 NOTE — Progress Notes (Signed)
CARDIAC REHAB PHASE I   PRE:  Rate/Rhythm: 90 SR  BP:  Supine: 148/67  Sitting:   Standing:    SaO2:   MODE:  Ambulation: 132 ft   POST:  Rate/Rhythm: 112 ST  BP:  Supine:   Sitting: 178/49  Standing:    SaO2:  6701-4103 Pt has had education on last admission and did not want it repeated. He has been on plavix. Pt and wife stated they were ok with past ed. Notified pt that referral to [facility] has been made and he stated would not attend. Pt walked 132 ft with cane and no CP.  Back to sitting on side of bed. Heart rate elevated with activity. Pt wants to go home.   Carlos Good, RN BSN  04/22/2017 8:37 AM

## 2017-04-22 NOTE — Consult Note (Signed)
            [facility] CM Primary Care Navigator  04/22/2017  TYLEE NEWBY [birth year] [phone]   Attemptto seepatient at the bedside to identify possible discharge needs but was unable to due to writer's injury. Patient was discharged home today .  Primary care provider's office is listed as doing transition of care (TOC).  For additional questions please contact:  Edwena Felty A. Brodin Gelpi, BSN, RN-BC [facility] PRIMARY CARE Navigator Cell: [phone]

## 2017-04-22 NOTE — Discharge Summary (Signed)
Discharge Summary    Patient ID: Carlos Herman,  MRN: [id number], DOB/AGE: 1941-05-24 76 y.o.  Admit date: 04/20/2017 Discharge date: 04/22/2017  Primary Care Provider: Seward Carol Primary Cardiologist: Johnsie Cancel   Discharge Diagnoses    Active Problems:   NSTEMI (non-ST elevated myocardial infarction) [facility])   Chest pain   Allergies Allergies  Allergen Reactions  . Amlodipine Besylate Other (See Comments)    Please use [employer] brand amlodipine for pt - states he becomes aggressive/agitated and refuses other manufacturers  . Penicillins Swelling and Other (See Comments)    Redness Has patient had a PCN reaction causing immediate rash, facial/tongue/throat swelling, SOB or lightheadedness with hypotension: No Has patient had a PCN reaction causing severe rash involving mucus membranes or skin necrosis: No Has patient had a PCN reaction that required hospitalization No Has patient had a PCN reaction occurring within the last 10 years: Non If all of the above answers are "NO", then may proceed with Cephalosporin use.   . Statins Other (See Comments)      muscle pain    Diagnostic Studies/Procedures    Cath: 04/21/17  Conclusion     Ost LM to LM lesion, 50 %stenosed.  Ost LAD to Prox LAD lesion, 100 %stenosed.  Mid LAD-1 lesion, 100 %stenosed.  Mid LAD-2 lesion, 70 %stenosed.  Dist LAD lesion, 90 %stenosed.  Ost Cx to Prox Cx lesion, 100 %stenosed.  Prox Cx to Mid Cx lesion, 100 %stenosed.  Ost RCA to Mid RCA lesion, 20 %stenosed.  Acute Mrg lesion, 90 %stenosed.  RPDA lesion, 99 %stenosed.  2nd RPLB lesion, 70 %stenosed.  SVG to RCA  Origin lesion, 100 %stenosed.  1st Diag-2 lesion, 100 %stenosed.  1st Diag-1 lesion, 99 %stenosed.  SVG to diagonal is patent  Dist Graft to Insertion lesion, 99 %stenosed.  LIMA and is normal in caliber and anatomically normal.  Seq SVG- to OM1 and OM2 with 90% proximal SVG stenosis proximal to and  involving prior stent  Ost 3rd Mrg to 3rd Mrg lesion, 60 %stenosed.  Origin to Prox Graft lesion, 90 %stenosed.  Ost RCA lesion, 80 %stenosed.  Mid RCA lesion, 50 %stenosed.  Dist RCA-2 lesion, 90 %stenosed.  Dist RCA-1 lesion, 15 %stenosed.  LV end diastolic pressure is normal.  A STENT SIERRA 4.00 X 33 MM drug eluting stent was successfully placed, and overlaps previously placed stent.  Prox Graft lesion, 90 %stenosed.  A drug eluting .  Post intervention, there is a 0% residual stenosis.   1. Severe 3 vessel obstructive CAD 2. Patent LIMA to the LAD. There is significant disease in the mid and distal LAD post graft insertion 3. Patent SVG to the first diagonal but the diagonal is occluded and the graft only supplies a small septal branch 4. Patent SVG to OM 1 and OM2 with new lesion in the proximal SVG to 90% proximal to and involving the prior stent. The distal SVG stent is patent 5. Occluded SVG to RCA- chronic 6. Normal LVEDP 7. Successful stenting of the proximal SVG to OM with DES using distal protection  Plan: continue DAPT indefinitely. The patient does have progression of disease also in the ostium of the RCA and distal RCA (in stent). If he has refractory angina despite medical therapy these may be potential targets for PCI. Given technical difficulties from left radial approach I would recommend using the femoral approach in the future. Anticipate DC in am.    TTE: 04/21/17  Study  Conclusions  - Left ventricle: The cavity size was normal. Systolic function was   normal. The estimated ejection fraction was in the range of 60%   to 65%. Wall motion was normal; there were no regional wall   motion abnormalities. Left ventricular diastolic function   parameters were normal. - Mitral valve: There was trivial regurgitation directed   eccentrically and toward the septum. - Left atrium: The atrium was mildly dilated. - Pulmonic valve: There was trivial  regurgitation. - Pulmonary arteries: Systolic pressure could not be accurately   estimated.  _____________   History of Present Illness     76 y.o. male with a history of Carlos Herman is a 76 y.o. male with a hx of CAD s/p CABG 1996, HTN, HLD, DM, hx bil PE 2015 who presented with chest pain. Carlos Herman is s/p CABG in 1996. Most recent cardiac cath in 02/2016 showed multiple patent stents to the RCA, severe disease in small distal branches including RV marginal and small PDA, patent LIMA to LAD, subtotal occlusion of SVG to first diagonal with native vessel occluded, sequential SVG to first and second OM with severe stenosis in proximal and distal SVG just prior to anastomosis, occluded SVG to RCA-chronic. Proximal and distal SVG to OM1/2 was stented with DES. There was normal LV function and normal LVEDP. He is to be on DAPT indefinitely.   He was last seen in the office by Dr. Johnsie Cancel on 10/20/16 at which time it was noted that pt had resolution of dizziness off ramipril. BP was stable and pt continued DAPT. Off eliquis which he had taken for bilat PE in 2015.  He presented to ED on 04/20/17 with chest pain. He stated that he began to have increased fatigue and shortness of breath and just not feeling well with house hold chores like mowing the grass and working on his car for the last few weeks. The other day he had to stop after each lap of mowing the grass which is unusual for him. The day prior to admission he went to [facility] and was not feeling well, not anything specific. That night he had trouble laying down and kept having to sit up to feel better. The morning of admission he had chest pain without associated shortness of breath that eventually resolved after SL NTG X 3. He again felt poorly and was unable to finish his breakfast which he usually has a very good appetite. Given his symptoms he was admitted with plan for cardiac cath.   Hospital Course     He was started on IV  heparin. Underwent cardiac cath with Dr. Martinique noted above with patent LIMA to the LAD, SVG to first diag, SVG to OM1/OM2 and chronically occluded SVG to RCA. New lesion in the SVG to OM with successful PCI/DESx1. Plan for DAPT with ASA/Plavix indefinitely. Normal LVEDP. Did have progression in the ostium/distal RCA within previous stent. If has recurrent angina, this could be a target for further PCI. Dr. Martinique recommended radial approach in the future. Follow up echo showed a normal EF.  Post cath labs showed stable Cr 0.88 and Hgb 12.9. No complications noted post cath. Worked well with cardiac rehab.   General: Well developed, well nourished, male appearing in no acute distress. Head: Normocephalic, atraumatic.  Neck: Supple without bruits, JVD. Lungs:  Resp regular and unlabored, CTA. Heart: RRR, S1, S2, no S3, S4, or murmur; no rub. Abdomen: Soft, non-tender, non-distended with normoactive bowel sounds. No hepatomegaly.  No rebound/guarding. No obvious abdominal masses. Extremities: No clubbing, cyanosis, edema. Distal pedal pulses are 2+ bilaterally. L radial cath site stable without bruising or hematoma Neuro: Alert and oriented X 3. Moves all extremities spontaneously. Psych: Normal affect.  Carlos Herman was seen by Dr. Ellyn Hack and determined stable for discharge home. Follow up in the office has been arranged. Medications are listed below.   _____________  Discharge Vitals Blood pressure (!) 144/71, pulse 68, temperature 98.1 F (36.7 C), temperature source Oral, resp. rate 15, height 5\' 9"  (1.753 m), weight 210 lb (95.3 kg), SpO2 94 %.  Filed Weights   04/20/17 1121 04/22/17 0539  Weight: 219 lb (99.3 kg) 210 lb (95.3 kg)    Labs & Radiologic Studies    CBC  Recent Labs  04/20/17 0951  04/21/17 1417 04/22/17 0203  WBC 8.3  < > 9.2 11.0*  NEUTROABS 5.5  --   --   --   HGB 13.2  < > 14.4 12.9*  HCT 40.5  < > 42.3 39.2  MCV 95.5  < > 94.0 93.8  PLT 231  < > 238 245   < > = values in this interval not displayed. Basic Metabolic Panel  Recent Labs  04/21/17 0356 04/21/17 1417 04/22/17 0203  NA 136  --  138  K 4.0  --  3.9  CL 103  --  104  CO2 22  --  23  GLUCOSE 133*  --  114*  BUN 11  --  9  CREATININE 0.81 1.01 0.88  CALCIUM 8.7*  --  8.7*   Liver Function Tests No results for input(s): AST, ALT, ALKPHOS, BILITOT, PROT, ALBUMIN in the last 72 hours. No results for input(s): LIPASE, AMYLASE in the last 72 hours. Cardiac Enzymes No results for input(s): CKTOTAL, CKMB, CKMBINDEX, TROPONINI in the last 72 hours. BNP Invalid input(s): POCBNP D-Dimer No results for input(s): DDIMER in the last 72 hours. Hemoglobin A1C No results for input(s): HGBA1C in the last 72 hours. Fasting Lipid Panel No results for input(s): CHOL, HDL, LDLCALC, TRIG, CHOLHDL, LDLDIRECT in the last 72 hours. Thyroid Function Tests No results for input(s): TSH, T4TOTAL, T3FREE, THYROIDAB in the last 72 hours.  Invalid input(s): FREET3 _____________  Dg Chest 2 View  Result Date: 04/20/2017 CLINICAL DATA:  Chest pain and shortness of breath with activity. EXAM: CHEST  2 VIEW COMPARISON:  03/23/2016. FINDINGS: Trachea is midline. Heart size stable. Lungs are low in volume with mild bibasilar atelectasis. No airspace consolidation or pleural fluid. Degenerative changes in the spine. IMPRESSION: No acute findings. Electronically Signed   By: Lorin Picket M.D.   On: 04/20/2017 09:51   Disposition   Pt is being discharged home today in good condition.  Follow-up Plans & Appointments    Follow-up Information    Isaiah Serge, NP Follow up on 04/29/2017.   Specialties:  Cardiology, Radiology Why:  at 11am for your follow up appt.  Contact information: [address] [place] [address] [phone]          Discharge Instructions    AMB Referral to Cardiac Rehabilitation - Phase II    Complete by:  As directed    Diagnosis:  NSTEMI   Amb  Referral to Cardiac Rehabilitation    Complete by:  As directed    Diagnosis:   Coronary Stents NSTEMI     Call MD for:  redness, tenderness, or signs of infection (pain, swelling, redness, odor  or green/yellow discharge around incision site)    Complete by:  As directed    Diet - low sodium heart healthy    Complete by:  As directed    Discharge instructions    Complete by:  As directed    Radial Site Care Refer to this sheet in the next few weeks. These instructions provide you with information on caring for yourself after your procedure. Your caregiver may also give you more specific instructions. Your treatment has been planned according to current medical practices, but problems sometimes occur. Call your caregiver if you have any problems or questions after your procedure. HOME CARE INSTRUCTIONS You may shower the day after the procedure.Remove the bandage (dressing) and gently wash the site with plain soap and water.Gently pat the site dry.  Do not apply powder or lotion to the site.  Do not submerge the affected site in water for 3 to 5 days.  Inspect the site at least twice daily.  Do not flex or bend the affected arm for 24 hours.  No lifting over 5 pounds (2.3 kg) for 5 days after your procedure.  Do not drive home if you are discharged the same day of the procedure. Have someone else drive you.  You may drive 24 hours after the procedure unless otherwise instructed by your caregiver.  What to expect: Any bruising will usually fade within 1 to 2 weeks.  Blood that collects in the tissue (hematoma) may be painful to the touch. It should usually decrease in size and tenderness within 1 to 2 weeks.  SEEK IMMEDIATE MEDICAL CARE IF: You have unusual pain at the radial site.  You have redness, warmth, swelling, or pain at the radial site.  You have drainage (other than a small amount of blood on the dressing).  You have chills.  You have a fever or persistent symptoms for more  than 72 hours.  You have a fever and your symptoms suddenly get worse.  Your arm becomes pale, cool, tingly, or numb.  You have heavy bleeding from the site. Hold pressure on the site.   PLEASE DO NOT MISS ANY DOSES OF YOUR PLAVIX!!!!! Also keep a log of you blood pressures and bring back to your follow up appt. Please call the office with any questions.   Patients taking blood thinners should generally stay away from medicines like ibuprofen, Advil, Motrin, naproxen, and Aleve due to risk of stomach bleeding. You may take Tylenol as directed or talk to your primary doctor about alternatives.   Increase activity slowly    Complete by:  As directed       Discharge Medications     Medication List    TAKE these medications   amLODipine 10 MG tablet Commonly known as:  NORVASC take 1 tablet by mouth once daily   aspirin EC 81 MG tablet Take 81 mg by mouth daily.   CAL MAG ZINC +D3 PO Take 1 tablet by mouth 2 (two) times daily. Per 3 tablets; Calcium 1000 mg, magnesium 400 mg, zinc 25 mg, vitamin D3 600 I.U.   Calcium-Magnesium-Zinc 333-133-5 MG Tabs Take 1 tablet by mouth daily.   clopidogrel 75 MG tablet Commonly known as:  PLAVIX take 1 tablet by mouth once daily WITH BREAKFAST   glyBURIDE 5 MG tablet Commonly known as:  DIABETA Take 5 mg by mouth daily.   isosorbide mononitrate 60 MG 24 hr tablet Commonly known as:  IMDUR take 1 tablet by mouth at bedtime  metFORMIN 500 MG tablet Commonly known as:  GLUCOPHAGE Take 2 tablets (1,000 mg total) by mouth 2 (two) times daily with a meal.   metoprolol succinate 100 MG 24 hr tablet Commonly known as:  TOPROL-XL take 1 tablet by mouth every morning and take 1/2 tablet every evening  (TAKE WITH OR IMMEDATELY AFTER MEALS)   nitroGLYCERIN 0.4 MG SL tablet Commonly known as:  NITROSTAT Place 1 tablet (0.4 mg total) under the tongue every 5 (five) minutes as needed for chest pain.   oxyCODONE-acetaminophen 10-325 MG  tablet Commonly known as:  PERCOCET Take 1 tablet by mouth 2 (two) times daily.        Aspirin prescribed at discharge?  Yes High Intensity Statin Prescribed? (Lipitor 40-80mg  or Crestor 20-40mg ): No: Intolerant Beta Blocker Prescribed? Yes For EF <40%, was ACEI/ARB Prescribed? No: EF ok ADP Receptor Inhibitor Prescribed? (i.e. Plavix etc.-Includes Medically Managed Patients): Yes For EF <40%, Aldosterone Inhibitor Prescribed? No: EF ok Was EF assessed during THIS hospitalization? Yes Was Cardiac Rehab II ordered? (Included Medically managed Patients): Yes   Outstanding Labs/Studies   N/a   Duration of Discharge Encounter   Greater than 30 minutes including physician time.  Signed, Reino Bellis NP-C 04/22/2017, 10:52 AM   I have seen, examined and evaluated the patient this AM along with Reino Bellis, NP-C.  After reviewing all the available data and chart, we discussed the patients laboratory, study & physical findings as well as symptoms in detail. I agree with her findings, examination as well as impression, summary & d/c recommendations as per our discussion.    Carlos Herman was admitted for signs symptoms concerning for ACS/non-STEMI. He did rule in for MI, and was found to have significant disease in the vein graft to the OM. This was stented. He also has some RCA lesions which for now being treated medically. He was able to inflate today without any significant chest pain. Would only consider RCA intervention if his symptoms recur. Carlos Herman is stable for discharge.    Glenetta Hew, M.D., M.S. Interventional Cardiologist   Pager # [phone] Phone # [phone] [address] [place], [place] [address]

## 2017-04-28 NOTE — Progress Notes (Signed)
Cardiology Office Note   Date:  04/29/2017   ID:  Carlos Herman, DOB [birth year], MRN [id number]  PCP:  Seward Carol, MD  Cardiologist:  Dr. Johnsie Cancel     Chief Complaint  Patient presents with  . Hospitalization Follow-up      History of Present Illness: Carlos Herman is a 76 y.o. male who presents for post hospitalization for NSTEMI  Successful stenting of the proximal SVG to OM with DES using distal protection  He has a hx of CAD s/p CABG 1996, HTN, HLD, DM, hx bil PE 2015.  s/p CABG in 1996. Most recent cardiac cath in 02/2016 showed multiple patent stents to the RCA, severe disease in small distal branches including RV marginal and small PDA, patent LIMA to LAD, subtotal occlusion of SVG to first diagonal with native vessel occluded, sequential SVG to first and second OM with severe stenosis in proximal and distal SVG just prior to anastomosis, occluded SVG to RCA-chronic. Proximal and distal SVG to OM1/2 was stented with DES. There was normal LV function and normal LVEDP. He is to be on DAPT indefinitely  Today he is doing well. He may have a chest sensation episodically but not his angina.  No SOB.  He is taking meds as prescribed.  He does not check his glucose.  He does not exercise and has no plans for cardiac rehab.  He walks with a cane and has chronic Rt knee pain.   He is eating healthy. Cholesterol is followed through his PCP.  Past Medical History:  Diagnosis Date  . CAD (coronary artery disease)    a. s/p CABG; b. s/p diffuse stenting to RCA; c. 3/4 grafts known to be occluded;  d.  LHC 12/15:  pLAD 100, small CFx diff disz - no amenable to PCI, RCA severe mid and dist ISR, S-OM, S-RCA 100, L-LAD atretic, S-Dx ok (dist seg of Dx 100) >> PCI:  DES to mRCA and POBA/cutting balloon to dist RCA   . Depression   . DM (diabetes mellitus) ([place])    Type 2  . Dyslipidemia   . Hemorrhoids    internal  . History of Doppler ultrasound    Carotid US (2/16):  Normal  carotid arteries bilaterally.  Marland Kitchen History of hiatal hernia   . History of kidney stones   . Hx of echocardiogram    a. Echo 10/15:  mild LVH, EF 55-60%, no RWMA, Ao root 39 mm, mild MR, mild LAE  . Hypertension   . Leg edema    right  . Myocardial infarction ([place])   . Nephrolithiasis   . Nocturia   . Obesity    morbid  . Osteoarthritis   . Pneumonia   . Pulmonary embolism ([place])    dx 03/2014 >> On Apixaban  . Seizures ([place])    "when I was hospitalized for stroke"  . Stroke ([place]) \   no deficits  . Wears glasses     Past Surgical History:  Procedure Laterality Date  . ANGIOPLASTY / STENTING ILIAC    . BACK SURGERY     x2  . CARDIAC CATHETERIZATION N/A 03/25/2016   Procedure: Left Heart Cath and Cors/Grafts Angiography;  Surgeon: Peter M Martinique, MD;  Location: [place] [facility];  Service: Cardiovascular;  Laterality: N/A;  . CARDIAC CATHETERIZATION N/A 03/25/2016   Procedure: Coronary Stent Intervention;  Surgeon: Peter M Martinique, MD;  Location: [place] [facility];  Service: Cardiovascular;  Laterality: N/A;  .  COLONOSCOPY    . CORONARY ANGIOPLASTY WITH STENT PLACEMENT  04/21/2017  . CORONARY ARTERY BYPASS GRAFT  1996   CABG X5  . CORONARY STENT INTERVENTION N/A 04/21/2017   Procedure: CORONARY STENT INTERVENTION;  Surgeon: Martinique, Peter M, MD;  Location: [place];  Service: Cardiovascular;  Laterality: N/A;  . HERNIA REPAIR    . JOINT REPLACEMENT    . LEFT HEART CATH AND CORS/GRAFTS ANGIOGRAPHY N/A 04/21/2017   Procedure: LEFT HEART CATH AND CORS/GRAFTS ANGIOGRAPHY;  Surgeon: Martinique, Peter M, MD;  Location: [facility];  Service: Cardiovascular;  Laterality: N/A;  . LEFT HEART CATHETERIZATION WITH CORONARY/GRAFT ANGIOGRAM N/A 06/16/2014   Procedure: LEFT HEART CATHETERIZATION WITH Beatrix Fetters;  Surgeon: Burnell Blanks, MD;  Location: [facility] CATH LAB;  Service: Cardiovascular;  Laterality: N/A;  . LUMBAR LAMINECTOMY/DECOMPRESSION  MICRODISCECTOMY N/A 02/06/2016   Procedure: Laminectomy and Foraminotomy - Lumbar one-Lumbar two;  Surgeon: Eustace Moore, MD;  Location: [place];  Service: Neurosurgery;  Laterality: N/A;  . PERCUTANEOUS CORONARY STENT INTERVENTION (PCI-S)  06/16/2014   Procedure: PERCUTANEOUS CORONARY STENT INTERVENTION (PCI-S);  Surgeon: Burnell Blanks, MD;  Location: [facility] CATH LAB;  Service: Cardiovascular;;  MID RCA, Cutting Balloon and Stent Distal RCA, Cutting Balloon   . TOTAL KNEE ARTHROPLASTY     right  . UMBILICAL HERNIA REPAIR    . UMBILICAL HERNIA REPAIR     Archie Endo 11/13/2010     Current Outpatient Prescriptions  Medication Sig Dispense Refill  . amLODipine (NORVASC) 10 MG tablet take 1 tablet by mouth once daily 90 tablet 3  . aspirin EC 81 MG tablet Take 81 mg by mouth daily.    . Calcium-Magnesium-Zinc 333-133-5 MG TABS Take 1 tablet by mouth daily.    . clopidogrel (PLAVIX) 75 MG tablet take 1 tablet by mouth once daily WITH BREAKFAST 90 tablet 3  . glyBURIDE (DIABETA) 5 MG tablet Take 5 mg by mouth daily.  0  . isosorbide mononitrate (IMDUR) 60 MG 24 hr tablet take 1 tablet by mouth at bedtime 90 tablet 1  . metFORMIN (GLUCOPHAGE) 500 MG tablet Take 2 tablets (1,000 mg total) by mouth 2 (two) times daily with a meal.    . metoprolol succinate (TOPROL-XL) 100 MG 24 hr tablet take 1 tablet by mouth every morning and take 1/2 tablet every evening  (TAKE WITH OR IMMEDATELY AFTER MEALS) 135 tablet 1  . Multiple Minerals-Vitamins (CAL MAG ZINC +D3 PO) Take 1 tablet by mouth 2 (two) times daily. Per 3 tablets; Calcium 1000 mg, magnesium 400 mg, zinc 25 mg, vitamin D3 600 I.U.    . nitroGLYCERIN (NITROSTAT) 0.4 MG SL tablet Place 1 tablet (0.4 mg total) under the tongue every 5 (five) minutes as needed for chest pain. 25 tablet 4  . oxyCODONE-acetaminophen (PERCOCET) 10-325 MG tablet Take 1 tablet by mouth 2 (two) times daily.  0   No current facility-administered medications for this  visit.     Allergies:   Amlodipine besylate; Penicillins; and Statins    Social History:  The patient  reports that he has never smoked. He has never used smokeless tobacco. He reports that he drinks alcohol. He reports that he does not use drugs.   Family History:  The patient's family history includes Diabetes in his mother; Stroke in his mother.    ROS:  General:no colds or fevers, no weight changes Skin:no rashes or ulcers, leg abrasions from bumping into things and dog jumping up on  his legs. HEENT:no blurred vision, no congestion CV:see HPI PUL:see HPI GI:no diarrhea constipation or melena, no indigestion GU:no hematuria, no dysuria MS:no joint pain, no claudication Neuro:no syncope, no lightheadedness Endo:+ diabetes, no thyroid disease  Wt Readings from Last 3 Encounters:  04/29/17 220 lb 12.8 oz (100.2 kg)  04/22/17 210 lb (95.3 kg)  10/20/16 218 lb (98.9 kg)     PHYSICAL EXAM: VS:  BP 138/70   Pulse 60   Ht 5\' 9"  (1.753 m)   Wt 220 lb 12.8 oz (100.2 kg)   SpO2 96%   BMI 32.61 kg/m  , BMI Body mass index is 32.61 kg/m. General:Pleasant affect, NAD Skin:Warm and dry, brisk capillary refill HEENT:normocephalic, sclera clear, mucus membranes moist Neck:supple, no JVD, no bruits  Heart:S1S2 RRR without murmur, gallup, rub or click Lungs:clear without rales, rhonchi, or wheezes SEG:BTDV, non tender, + BS, do not palpate liver spleen or masses Ext:no to tr lower ext edema, 2+ pedal pulses, 2+ radial pulses Neuro:alert and oriented X 3, MAE, follows commands, + facial symmetry    EKG:  EKG is NOT ordered today.    Recent Labs: 04/20/2017: B Natriuretic Peptide 260.1 04/22/2017: BUN 9; Creatinine, Ser 0.88; Hemoglobin 12.9; Platelets 245; Potassium 3.9; Sodium 138    Lipid Panel    Component Value Date/Time   CHOL 120 03/25/2016 0531   TRIG 158 (H) 03/25/2016 0531   HDL 24 (L) 03/25/2016 0531   CHOLHDL 5.0 03/25/2016 0531   VLDL 32 03/25/2016 0531    LDLCALC 64 03/25/2016 0531       Other studies Reviewed: Additional studies/ records that were reviewed today include: . ECHO 04/21/17 Study Conclusions  - Left ventricle: The cavity size was normal. Systolic function was   normal. The estimated ejection fraction was in the range of 60%   to 65%. Wall motion was normal; there were no regional wall   motion abnormalities. Left ventricular diastolic function   parameters were normal. - Mitral valve: There was trivial regurgitation directed   eccentrically and toward the septum. - Left atrium: The atrium was mildly dilated. - Pulmonic valve: There was trivial regurgitation. - Pulmonary arteries: Systolic pressure could not be accurately   estimated.   Cardiac cath  Procedures   CORONARY STENT INTERVENTION  LEFT HEART CATH AND CORS/GRAFTS ANGIOGRAPHY  Conclusion     Ost LM to LM lesion, 50 %stenosed.  Ost LAD to Prox LAD lesion, 100 %stenosed.  Mid LAD-1 lesion, 100 %stenosed.  Mid LAD-2 lesion, 70 %stenosed.  Dist LAD lesion, 90 %stenosed.  Ost Cx to Prox Cx lesion, 100 %stenosed.  Prox Cx to Mid Cx lesion, 100 %stenosed.  Ost RCA to Mid RCA lesion, 20 %stenosed.  Acute Mrg lesion, 90 %stenosed.  RPDA lesion, 99 %stenosed.  2nd RPLB lesion, 70 %stenosed.  SVG to RCA  Origin lesion, 100 %stenosed.  1st Diag-2 lesion, 100 %stenosed.  1st Diag-1 lesion, 99 %stenosed.  SVG to diagonal is patent  Dist Graft to Insertion lesion, 99 %stenosed.  LIMA and is normal in caliber and anatomically normal.  Seq SVG- to OM1 and OM2 with 90% proximal SVG stenosis proximal to and involving prior stent  Ost 3rd Mrg to 3rd Mrg lesion, 60 %stenosed.  Origin to Prox Graft lesion, 90 %stenosed.  Ost RCA lesion, 80 %stenosed.  Mid RCA lesion, 50 %stenosed.  Dist RCA-2 lesion, 90 %stenosed.  Dist RCA-1 lesion, 15 %stenosed.  LV end diastolic pressure is normal.  A STENT SIERRA 4.00 X  38 MM drug eluting stent  was successfully placed, and overlaps previously placed stent.  Prox Graft lesion, 90 %stenosed.  A drug eluting .  Post intervention, there is a 0% residual stenosis.   1. Severe 3 vessel obstructive CAD 2. Patent LIMA to the LAD. There is significant disease in the mid and distal LAD post graft insertion 3. Patent SVG to the first diagonal but the diagonal is occluded and the graft only supplies a small septal branch 4. Patent SVG to OM 1 and OM2 with new lesion in the proximal SVG to 90% proximal to and involving the prior stent. The distal SVG stent is patent 5. Occluded SVG to RCA- chronic 6. Normal LVEDP 7. Successful stenting of the proximal SVG to OM with DES using distal protection  Plan: continue DAPT indefinitely. The patient does have progression of disease also in the ostium of the RCA and distal RCA (in stent). If he has refractory angina despite medical therapy these may be potential targets for PCI. Given technical difficulties from left radial approach I would recommend using the femoral approach in the future. Anticipate DC in am.      ASSESSMENT AND PLAN:  1.  NSTEMI with stent placed proximal SVG to OM with DES using distal protection plan for DAPT indefinitely.    2.  CAD with hx CABG and residual disease noted in the ostium of the RCA and distal RCA, if refractory angina may need PCI.  On imdur and BB  3.  HLD stable he is intolerant to statins with muscle pain PCP follows lipids.  4.  DM-2 followed by PCP  5.  HTN controlled amlodipine, imdur and BB  6.  Hx pul. Embolus 2015 has been off Eliquis   7.  freq PACs on admit, but now regular.   Current medicines are reviewed with the patient today.  The patient Has no concerns regarding medicines.  The following changes have been made:  See above Labs/ tests ordered today include:see above  Disposition:   FU:  see above  Signed, Cecilie Kicks, NP  04/29/2017 11:17 AM    [facility], [place], [place], [place] Phone: [phone]; Fax: [phone]

## 2017-04-29 ENCOUNTER — Encounter: Payer: Self-pay | Admitting: Cardiology

## 2017-04-29 ENCOUNTER — Ambulatory Visit (INDEPENDENT_AMBULATORY_CARE_PROVIDER_SITE_OTHER): Payer: Medicare Other | Admitting: Cardiology

## 2017-04-29 VITALS — BP 138/70 | HR 60 | Ht 69.0 in | Wt 220.8 lb

## 2017-04-29 DIAGNOSIS — E782 Mixed hyperlipidemia: Secondary | ICD-10-CM

## 2017-04-29 DIAGNOSIS — E118 Type 2 diabetes mellitus with unspecified complications: Secondary | ICD-10-CM

## 2017-04-29 DIAGNOSIS — I2510 Atherosclerotic heart disease of native coronary artery without angina pectoris: Secondary | ICD-10-CM

## 2017-04-29 DIAGNOSIS — I252 Old myocardial infarction: Secondary | ICD-10-CM

## 2017-04-29 DIAGNOSIS — Z951 Presence of aortocoronary bypass graft: Secondary | ICD-10-CM | POA: Diagnosis not present

## 2017-04-29 DIAGNOSIS — I10 Essential (primary) hypertension: Secondary | ICD-10-CM | POA: Diagnosis not present

## 2017-04-29 DIAGNOSIS — I25810 Atherosclerosis of coronary artery bypass graft(s) without angina pectoris: Secondary | ICD-10-CM

## 2017-04-29 DIAGNOSIS — I2583 Coronary atherosclerosis due to lipid rich plaque: Secondary | ICD-10-CM | POA: Diagnosis not present

## 2017-04-29 NOTE — Patient Instructions (Addendum)
Medication Instructions: Your physician recommends that you continue on your current medications as directed. Please refer to the Current Medication list given to you today.  Labwork: None Ordered  Procedures/Testing: None Ordered  Follow-Up: Your physician recommends that you schedule a follow-up appointment in: January or February 2019 with Dr. Johnsie Cancel  If you need a refill on your cardiac medications before your next appointment, please call your pharmacy.

## 2017-05-07 ENCOUNTER — Other Ambulatory Visit: Payer: Self-pay

## 2017-05-07 MED ORDER — AMLODIPINE BESYLATE 10 MG PO TABS: 10.0000 mg | ORAL_TABLET | Freq: Every day | ORAL | 3 refills | Status: AC

## 2017-05-20 ENCOUNTER — Ambulatory Visit: Payer: Medicare Other | Admitting: Cardiovascular Disease

## 2017-05-25 DIAGNOSIS — G894 Chronic pain syndrome: Secondary | ICD-10-CM | POA: Diagnosis not present

## 2017-05-25 DIAGNOSIS — M5416 Radiculopathy, lumbar region: Secondary | ICD-10-CM | POA: Diagnosis not present

## 2017-05-25 DIAGNOSIS — M150 Primary generalized (osteo)arthritis: Secondary | ICD-10-CM | POA: Diagnosis not present

## 2017-05-25 DIAGNOSIS — G5721 Lesion of femoral nerve, right lower limb: Secondary | ICD-10-CM | POA: Diagnosis not present

## 2017-06-03 ENCOUNTER — Encounter: Payer: Self-pay | Admitting: Internal Medicine

## 2017-06-30 DEATH — deceased

## 2017-07-27 NOTE — Progress Notes (Deleted)
Cardiology Office Note   Date:  07/27/2017   ID:  Carlos Herman, DOB [birth year], MRN [id number]  PCP:  Seward Carol, MD  Cardiologist:  Dr. Johnsie Cancel     No chief complaint on file.     History of Present Illness: Carlos Herman is a 77 y.o. male CAD/CABG   Successful stenting 04/21/17  of the proximal SVG to OM with DES using distal protection  He has a hx of CAD s/p CABG 1996, HTN, HLD, DM, hx bilateral PE 2015.  Most recent cardiac cath in 02/2016 showed multiple patent stents to the RCA, severe disease in small distal branches including RV marginal and small PDA, patent LIMA to LAD, subtotal occlusion of SVG to first diagonal with native vessel occluded, sequential SVG to first and second OM with severe stenosis in proximal and distal SVG just prior to anastomosis, occluded SVG to RCA-chronic. Proximal and distal SVG to OM1/2 was stented with DES. There was normal LV function and normal LVEDP. He is to be on DAPT indefinitely  Has multiple orthopedic issues that limit his activity as well Multiple previous back surgeries and TKR on right   ***  Past Medical History:  Diagnosis Date  . CAD (coronary artery disease)    a. s/p CABG; b. s/p diffuse stenting to RCA; c. 3/4 grafts known to be occluded;  d.  LHC 12/15:  pLAD 100, small CFx diff disz - no amenable to PCI, RCA severe mid and dist ISR, S-OM, S-RCA 100, L-LAD atretic, S-Dx ok (dist seg of Dx 100) >> PCI:  DES to mRCA and POBA/cutting balloon to dist RCA   . Depression   . DM (diabetes mellitus) ([place])    Type 2  . Dyslipidemia   . Hemorrhoids    internal  . History of Doppler ultrasound    Carotid US (2/16):  Normal carotid arteries bilaterally.  Marland Kitchen History of hiatal hernia   . History of kidney stones   . Hx of echocardiogram    a. Echo 10/15:  mild LVH, EF 55-60%, no RWMA, Ao root 39 mm, mild MR, mild LAE  . Hypertension   . Leg edema    right  . Myocardial infarction ([place])   . Nephrolithiasis   .  Nocturia   . Obesity    morbid  . Osteoarthritis   . Pneumonia   . Pulmonary embolism ([place])    dx 03/2014 >> On Apixaban  . Seizures ([place])    "when I was hospitalized for stroke"  . Stroke ([place]) \   no deficits  . Wears glasses     Past Surgical History:  Procedure Laterality Date  . ANGIOPLASTY / STENTING ILIAC    . BACK SURGERY     x2  . CARDIAC CATHETERIZATION N/A 03/25/2016   Procedure: Left Heart Cath and Cors/Grafts Angiography;  Surgeon: Tery Hoeger M Martinique, MD;  Location: [facility];  Service: Cardiovascular;  Laterality: N/A;  . CARDIAC CATHETERIZATION N/A 03/25/2016   Procedure: Coronary Stent Intervention;  Surgeon: Jensyn Cambria M Martinique, MD;  Location: [facility];  Service: Cardiovascular;  Laterality: N/A;  . COLONOSCOPY    . CORONARY ANGIOPLASTY WITH STENT PLACEMENT  04/21/2017  . CORONARY ARTERY BYPASS GRAFT  1996   CABG X5  . CORONARY STENT INTERVENTION N/A 04/21/2017   Procedure: CORONARY STENT INTERVENTION;  Surgeon: Martinique, Opie Maclaughlin M, MD;  Location: [facility];  Service: Cardiovascular;  Laterality: N/A;  . HERNIA REPAIR    .  JOINT REPLACEMENT    . LEFT HEART CATH AND CORS/GRAFTS ANGIOGRAPHY N/A 04/21/2017   Procedure: LEFT HEART CATH AND CORS/GRAFTS ANGIOGRAPHY;  Surgeon: Martinique, Jaylyn Booher M, MD;  Location: [facility];  Service: Cardiovascular;  Laterality: N/A;  . LEFT HEART CATHETERIZATION WITH CORONARY/GRAFT ANGIOGRAM N/A 06/16/2014   Procedure: LEFT HEART CATHETERIZATION WITH Beatrix Fetters;  Surgeon: Burnell Blanks, MD;  Location: [facility] CATH LAB;  Service: Cardiovascular;  Laterality: N/A;  . LUMBAR LAMINECTOMY/DECOMPRESSION MICRODISCECTOMY N/A 02/06/2016   Procedure: Laminectomy and Foraminotomy - Lumbar one-Lumbar two;  Surgeon: Eustace Moore, MD;  Location: [facility] [place] [facility];  Service: Neurosurgery;  Laterality: N/A;  . PERCUTANEOUS CORONARY STENT INTERVENTION (PCI-S)  06/16/2014   Procedure: PERCUTANEOUS CORONARY STENT  INTERVENTION (PCI-S);  Surgeon: Burnell Blanks, MD;  Location: [facility] CATH LAB;  Service: Cardiovascular;;  MID RCA, Cutting Balloon and Stent Distal RCA, Cutting Balloon   . TOTAL KNEE ARTHROPLASTY     right  . UMBILICAL HERNIA REPAIR    . UMBILICAL HERNIA REPAIR     Archie Endo 11/13/2010     Current Outpatient Medications  Medication Sig Dispense Refill  . amLODipine (NORVASC) 10 MG tablet Take 1 tablet (10 mg total) daily by mouth. 90 tablet 3  . aspirin EC 81 MG tablet Take 81 mg by mouth daily.    . Calcium-Magnesium-Zinc 333-133-5 MG TABS Take 1 tablet by mouth daily.    . clopidogrel (PLAVIX) 75 MG tablet take 1 tablet by mouth once daily WITH BREAKFAST 90 tablet 3  . glyBURIDE (DIABETA) 5 MG tablet Take 5 mg by mouth daily.  0  . isosorbide mononitrate (IMDUR) 60 MG 24 hr tablet take 1 tablet by mouth at bedtime 90 tablet 1  . metFORMIN (GLUCOPHAGE) 500 MG tablet Take 2 tablets (1,000 mg total) by mouth 2 (two) times daily with a meal.    . metoprolol succinate (TOPROL-XL) 100 MG 24 hr tablet take 1 tablet by mouth every morning and take 1/2 tablet every evening  (TAKE WITH OR IMMEDATELY AFTER MEALS) 135 tablet 1  . Multiple Minerals-Vitamins (CAL MAG ZINC +D3 PO) Take 1 tablet by mouth 2 (two) times daily. Per 3 tablets; Calcium 1000 mg, magnesium 400 mg, zinc 25 mg, vitamin D3 600 I.U.    . nitroGLYCERIN (NITROSTAT) 0.4 MG SL tablet Place 1 tablet (0.4 mg total) under the tongue every 5 (five) minutes as needed for chest pain. 25 tablet 4  . oxyCODONE-acetaminophen (PERCOCET) 10-325 MG tablet Take 1 tablet by mouth 2 (two) times daily.  0   No current facility-administered medications for this visit.     Allergies:   Amlodipine besylate; Penicillins; and Statins    Social History:  The patient  reports that  has never smoked. he has never used smokeless tobacco. He reports that he drinks alcohol. He reports that he does not use drugs.   Family History:  The patient's family  history includes Diabetes in his mother; Stroke in his mother.    ROS:  General:no colds or fevers, no weight changes Skin:no rashes or ulcers, leg abrasions from bumping into things and dog jumping up on his legs. HEENT:no blurred vision, no congestion CV:see HPI PUL:see HPI GI:no diarrhea constipation or melena, no indigestion GU:no hematuria, no dysuria MS:no joint pain, no claudication Neuro:no syncope, no lightheadedness Endo:+ diabetes, no thyroid disease  Wt Readings from Last 3 Encounters:  04/29/17 220 lb 12.8 oz (100.2 kg)  04/22/17 210 lb (95.3 kg)  10/20/16 218 lb (  98.9 kg)     PHYSICAL EXAM: VS:  There were no vitals taken for this visit. , BMI There is no height or weight on file to calculate BMI. Affect appropriate Overweight chronically ill white male  HEENT: normal Neck supple with no adenopathy JVP normal no bruits no thyromegaly Lungs clear with no wheezing and good diaphragmatic motion Heart:  S1/S2 no murmur, no rub, gallop or click PMI normal Abdomen: benighn, BS positve, no tenderness, no AAA no bruit.  No HSM or HJR Distal pulses intact with no bruits Plus one bilateral edema Neuro non-focal Skin warm and dry Right TKR      EKG:   04/22/17 SR rate 74 normal     Recent Labs: 04/20/2017: B Natriuretic Peptide 260.1 04/22/2017: BUN 9; Creatinine, Ser 0.88; Hemoglobin 12.9; Platelets 245; Potassium 3.9; Sodium 138    Lipid Panel    Component Value Date/Time   CHOL 120 03/25/2016 0531   TRIG 158 (H) 03/25/2016 0531   HDL 24 (L) 03/25/2016 0531   CHOLHDL 5.0 03/25/2016 0531   VLDL 32 03/25/2016 0531   LDLCALC 64 03/25/2016 0531       Other studies Reviewed: Additional studies/ records that were reviewed today include: . ECHO 04/21/17 Study Conclusions  - Left ventricle: The cavity size was normal. Systolic function was   normal. The estimated ejection fraction was in the range of 60%   to 65%. Wall motion was normal; there were  no regional wall   motion abnormalities. Left ventricular diastolic function   parameters were normal. - Mitral valve: There was trivial regurgitation directed   eccentrically and toward the septum. - Left atrium: The atrium was mildly dilated. - Pulmonic valve: There was trivial regurgitation. - Pulmonary arteries: Systolic pressure could not be accurately   estimated.   Cardiac cath  Procedures   CORONARY STENT INTERVENTION  LEFT HEART CATH AND CORS/GRAFTS ANGIOGRAPHY  Conclusion     Ost LM to LM lesion, 50 %stenosed.  Ost LAD to Prox LAD lesion, 100 %stenosed.  Mid LAD-1 lesion, 100 %stenosed.  Mid LAD-2 lesion, 70 %stenosed.  Dist LAD lesion, 90 %stenosed.  Ost Cx to Prox Cx lesion, 100 %stenosed.  Prox Cx to Mid Cx lesion, 100 %stenosed.  Ost RCA to Mid RCA lesion, 20 %stenosed.  Acute Mrg lesion, 90 %stenosed.  RPDA lesion, 99 %stenosed.  2nd RPLB lesion, 70 %stenosed.  SVG to RCA  Origin lesion, 100 %stenosed.  1st Diag-2 lesion, 100 %stenosed.  1st Diag-1 lesion, 99 %stenosed.  SVG to diagonal is patent  Dist Graft to Insertion lesion, 99 %stenosed.  LIMA and is normal in caliber and anatomically normal.  Seq SVG- to OM1 and OM2 with 90% proximal SVG stenosis proximal to and involving prior stent  Ost 3rd Mrg to 3rd Mrg lesion, 60 %stenosed.  Origin to Prox Graft lesion, 90 %stenosed.  Ost RCA lesion, 80 %stenosed.  Mid RCA lesion, 50 %stenosed.  Dist RCA-2 lesion, 90 %stenosed.  Dist RCA-1 lesion, 15 %stenosed.  LV end diastolic pressure is normal.  A STENT SIERRA 4.00 X 33 MM drug eluting stent was successfully placed, and overlaps previously placed stent.  Prox Graft lesion, 90 %stenosed.  A drug eluting .  Post intervention, there is a 0% residual stenosis.   1. Severe 3 vessel obstructive CAD 2. Patent LIMA to the LAD. There is significant disease in the mid and distal LAD post graft insertion 3. Patent SVG to the first  diagonal but the diagonal  is occluded and the graft only supplies a small septal branch 4. Patent SVG to OM 1 and OM2 with new lesion in the proximal SVG to 90% proximal to and involving the prior stent. The distal SVG stent is patent 5. Occluded SVG to RCA- chronic 6. Normal LVEDP 7. Successful stenting of the proximal SVG to OM with DES using distal protection  Plan: continue DAPT indefinitely. The patient does have progression of disease also in the ostium of the RCA and distal RCA (in stent). If he has refractory angina despite medical therapy these may be potential targets for PCI. Given technical difficulties from left radial approach I would recommend using the femoral approach in the future. Anticipate DC in am.      ASSESSMENT AND PLAN:  1.  NSTEMI with stent placed proximal SVG to OM with DES using distal protection plan for DAPT indefinitely.  Has Occluded SVG RCA with 80% ostial and distal native vessel disease ***  2.  CAD with hx CABG and residual disease noted in the ostium of the RCA and distal RCA, if refractory angina may need PCI.  On imdur and BB  3.  HLD stable he is intolerant to statins with muscle pain PCP follows lipids.  4.  DM-2 followed by PCP  5.  HTN controlled amlodipine, imdur and BB  6.  Hx pul. Embolus 2015 has been off Eliquis due to need DAT for CAD     Current medicines are reviewed with the patient today.  The patient Has no concerns regarding medicines.  The following changes have been made:  See above Labs/ tests ordered today include:see above  Disposition:  F/U with me in 6 months   Signed, Jenkins Rouge, MD  07/27/2017 12:32 PM    [facility] [place], [place], [place], [place] Phone: [phone]; Fax: [phone]

## 2017-07-29 ENCOUNTER — Ambulatory Visit: Payer: Medicare Other | Admitting: Cardiovascular Disease

## 2017-08-20 ENCOUNTER — Encounter: Payer: Self-pay | Admitting: Cardiovascular Disease

## 2018-07-05 ENCOUNTER — Encounter: Payer: Self-pay | Admitting: Internal Medicine

## 2019-09-15 ENCOUNTER — Encounter: Payer: Self-pay | Admitting: General Practice
